# Patient Record
Sex: Female | Born: 1980 | Race: Black or African American | Hispanic: No | Marital: Married | Smoking: Never smoker
Health system: Southern US, Community
[De-identification: ages and names within clinical notes are randomized; demographics above are authoritative.]

## PROBLEM LIST (undated history)

## (undated) ENCOUNTER — Inpatient Hospital Stay (HOSPITAL_COMMUNITY): Payer: Self-pay

## (undated) DIAGNOSIS — L509 Urticaria, unspecified: Secondary | ICD-10-CM

## (undated) DIAGNOSIS — D649 Anemia, unspecified: Secondary | ICD-10-CM

## (undated) DIAGNOSIS — IMO0002 Reserved for concepts with insufficient information to code with codable children: Secondary | ICD-10-CM

## (undated) DIAGNOSIS — D869 Sarcoidosis, unspecified: Secondary | ICD-10-CM

## (undated) DIAGNOSIS — M329 Systemic lupus erythematosus, unspecified: Secondary | ICD-10-CM

## (undated) HISTORY — DX: Reserved for concepts with insufficient information to code with codable children: IMO0002

## (undated) HISTORY — DX: Systemic lupus erythematosus, unspecified: M32.9

## (undated) HISTORY — DX: Urticaria, unspecified: L50.9

## (undated) HISTORY — DX: Anemia, unspecified: D64.9

## (undated) HISTORY — PX: DILATION AND CURETTAGE OF UTERUS: SHX78

## (undated) HISTORY — PX: BIOPSY THYROID: PRO38

---

## 2009-08-14 ENCOUNTER — Inpatient Hospital Stay (HOSPITAL_COMMUNITY): Admission: AD | Admit: 2009-08-14 | Discharge: 2009-08-14 | Payer: Self-pay | Admitting: Obstetrics & Gynecology

## 2009-08-14 ENCOUNTER — Ambulatory Visit: Payer: Self-pay | Admitting: Advanced Practice Midwife

## 2009-08-16 ENCOUNTER — Inpatient Hospital Stay (HOSPITAL_COMMUNITY): Admission: AD | Admit: 2009-08-16 | Discharge: 2009-08-16 | Payer: Self-pay | Admitting: Obstetrics & Gynecology

## 2009-08-23 ENCOUNTER — Inpatient Hospital Stay (HOSPITAL_COMMUNITY): Admission: AD | Admit: 2009-08-23 | Discharge: 2009-08-23 | Payer: Self-pay | Admitting: Obstetrics and Gynecology

## 2009-08-26 ENCOUNTER — Ambulatory Visit: Payer: Self-pay | Admitting: Obstetrics & Gynecology

## 2009-08-26 ENCOUNTER — Ambulatory Visit (HOSPITAL_COMMUNITY): Admission: RE | Admit: 2009-08-26 | Discharge: 2009-08-26 | Payer: Self-pay | Admitting: Obstetrics & Gynecology

## 2009-08-28 ENCOUNTER — Ambulatory Visit (HOSPITAL_COMMUNITY): Admission: AD | Admit: 2009-08-28 | Discharge: 2009-08-28 | Payer: Self-pay | Admitting: Obstetrics & Gynecology

## 2009-09-10 ENCOUNTER — Ambulatory Visit: Payer: Self-pay | Admitting: Obstetrics & Gynecology

## 2009-09-10 LAB — CONVERTED CEMR LAB
HCT: 30.2 % — ABNORMAL LOW (ref 36.0–46.0)
MCV: 86.6 fL (ref 78.0–100.0)
Platelets: 170 10*3/uL (ref 150–400)
RDW: 14.2 % (ref 11.5–15.5)

## 2010-08-16 IMAGING — US US OB TRANSVAGINAL
1 series · 14 of 28 positions shown · non-contrast
Comparison: none

OBSTETRICAL ULTRASOUND:
 This ultrasound exam was performed in the [HOSPITAL] Ultrasound Department.  The OB US report was generated in the AS system, and faxed to the ordering physician.  This report is also available in [REDACTED] PACS.

[Series 1: us ob comp less 14 wks · 0.14mm/px · 14 of 29 slices shown]
[im 2/29]
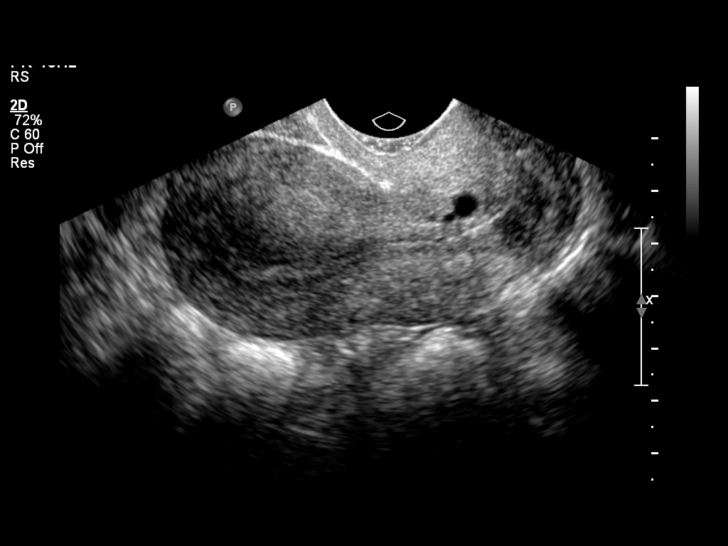
[im 4/29]
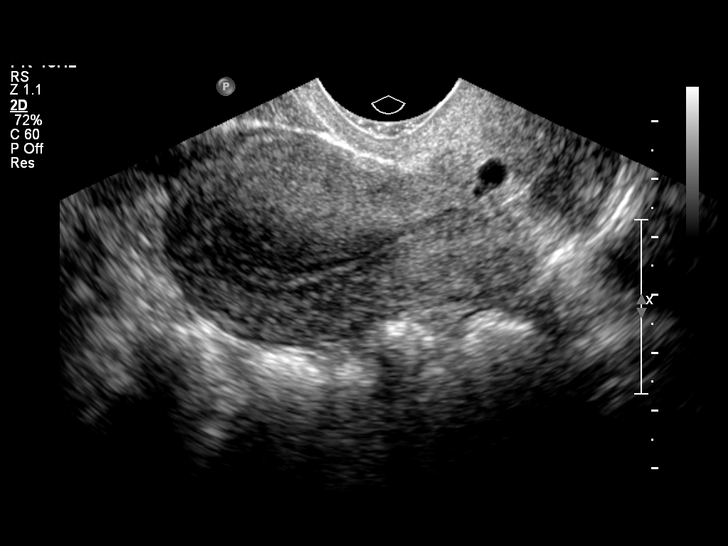
[im 6/29]
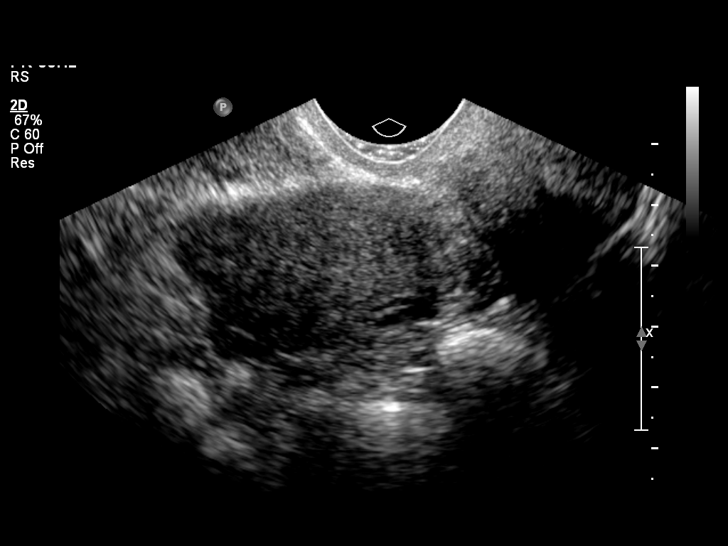
[im 8/29]
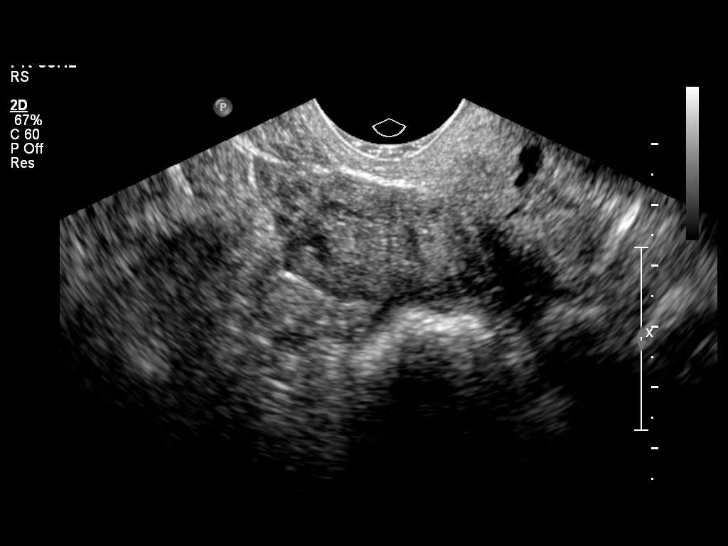
[im 10/29]
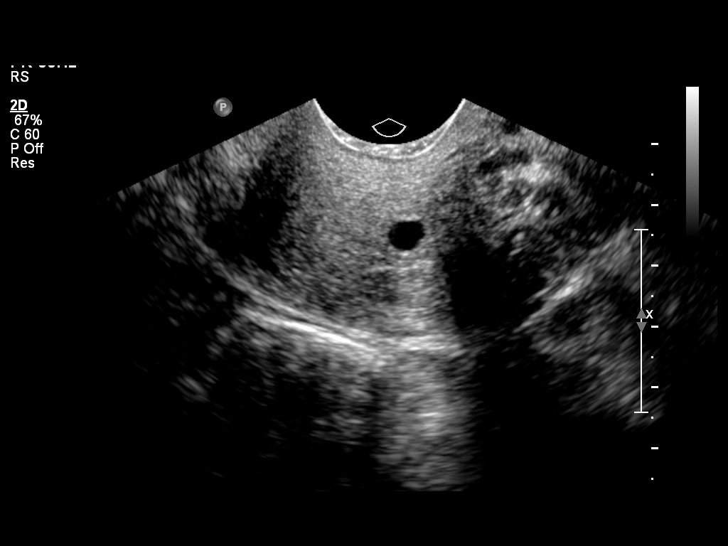
[im 12/29]
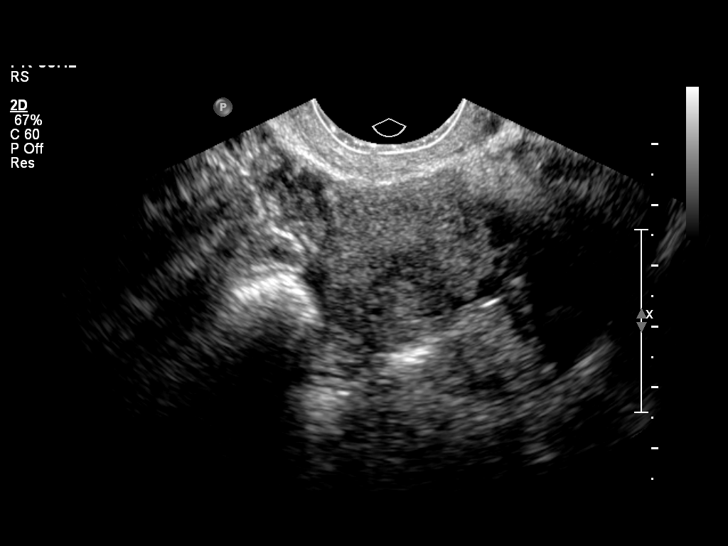
[im 14/29]
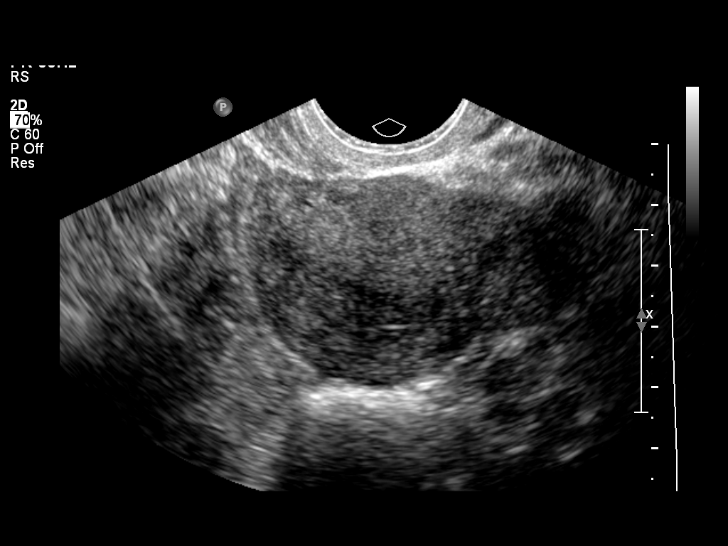
[im 16/29]
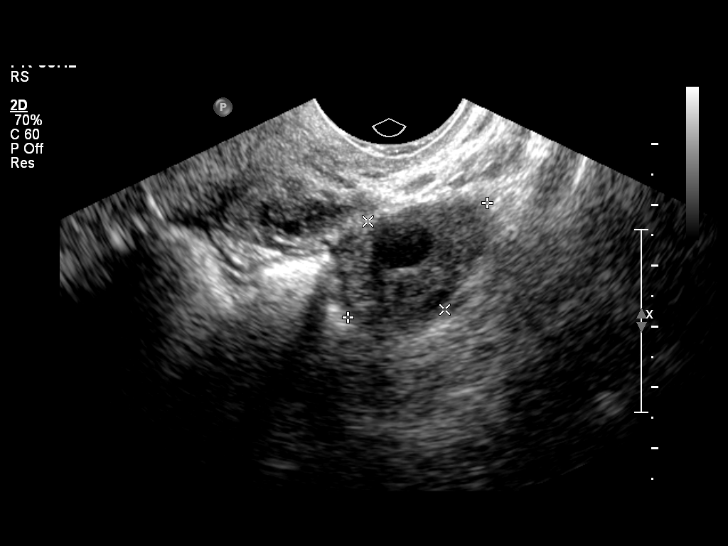
[im 18/29]
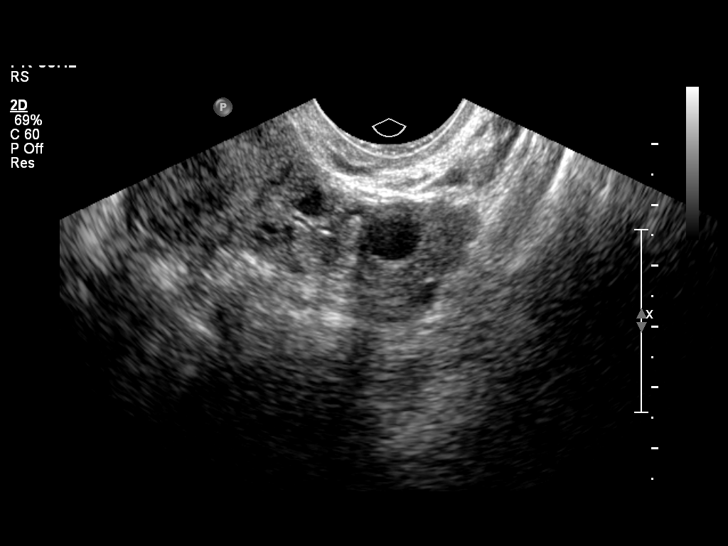
[im 20/29]
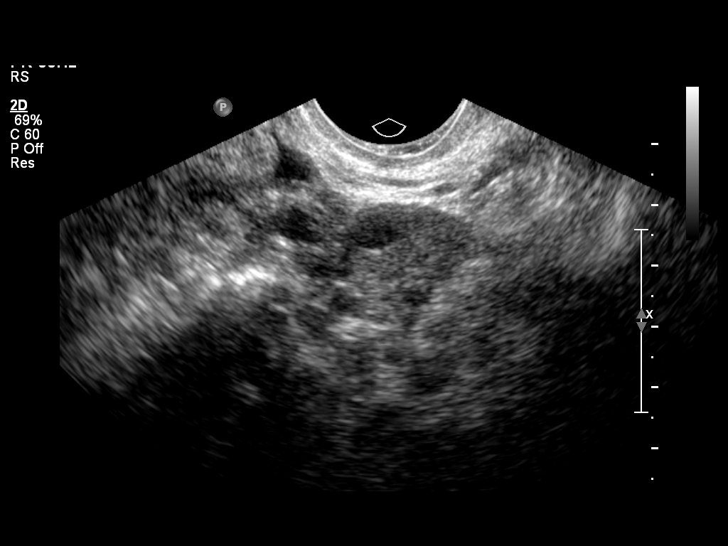
[im 22/29]
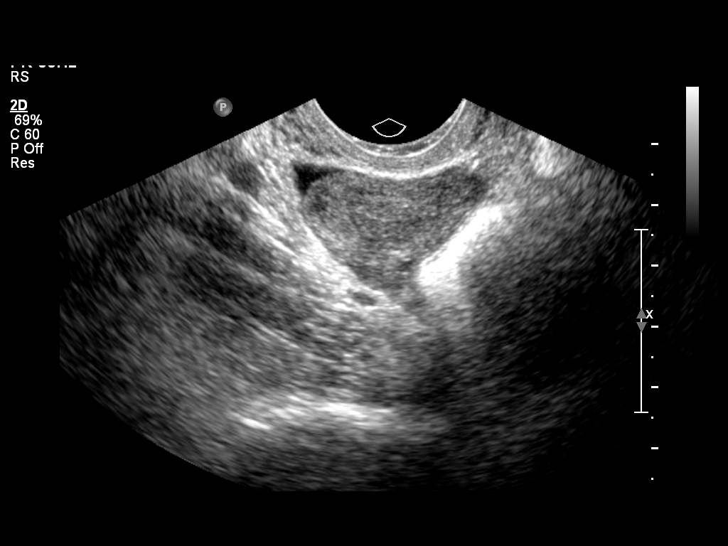
[im 24/29]
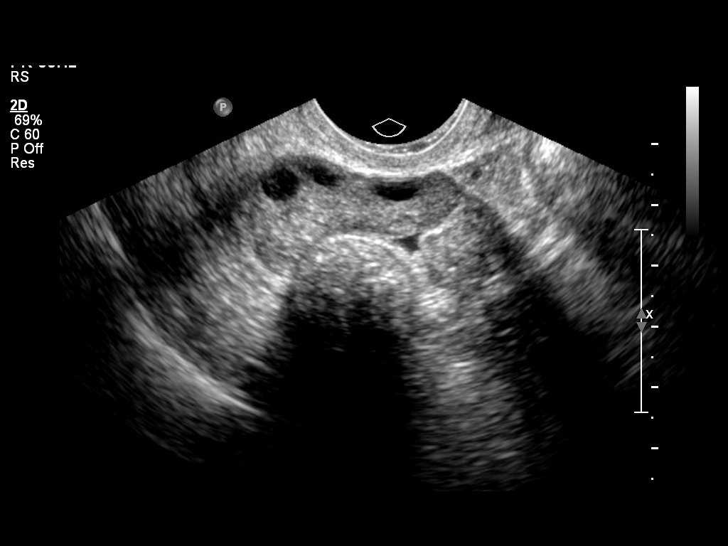
[im 26/29]
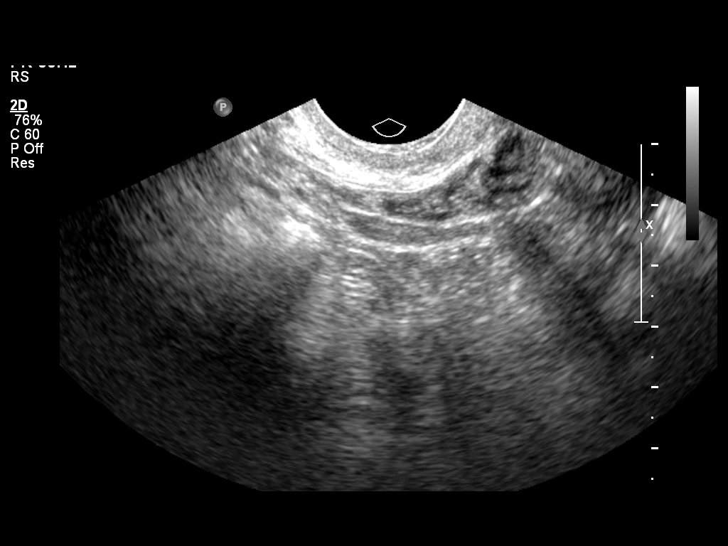
[im 29/29]
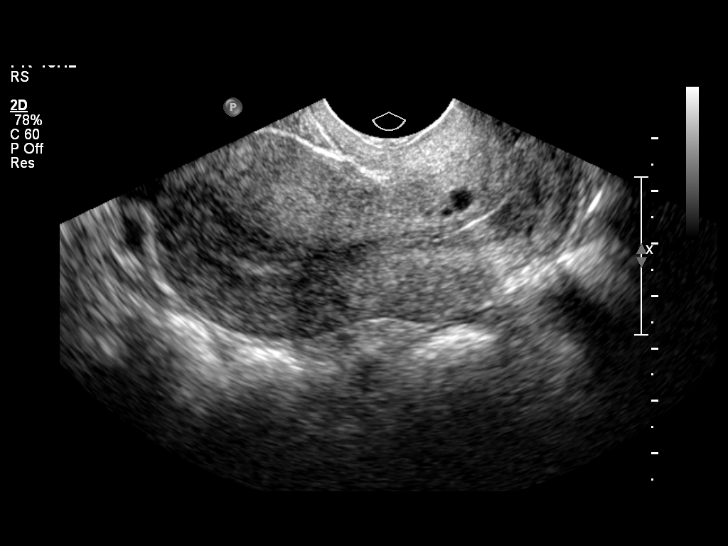

[14 of 28 positions shown; findings below may reference images not displayed]

IMPRESSION: See AS Obstetric US report.

## 2011-03-10 LAB — CBC
HCT: 33.4 % — ABNORMAL LOW (ref 36.0–46.0)
HCT: 33.5 % — ABNORMAL LOW (ref 36.0–46.0)
Hemoglobin: 11.1 g/dL — ABNORMAL LOW (ref 12.0–15.0)
Hemoglobin: 11.2 g/dL — ABNORMAL LOW (ref 12.0–15.0)
MCV: 86.4 fL (ref 78.0–100.0)
Platelets: 178 10*3/uL (ref 150–400)
RBC: 3.87 MIL/uL (ref 3.87–5.11)
WBC: 2.5 10*3/uL — ABNORMAL LOW (ref 4.0–10.5)
WBC: 2.7 10*3/uL — ABNORMAL LOW (ref 4.0–10.5)

## 2011-03-10 LAB — BUN: BUN: 7 mg/dL (ref 6–23)

## 2011-03-10 LAB — CREATININE, SERUM
Creatinine, Ser: 0.57 mg/dL (ref 0.4–1.2)
GFR calc non Af Amer: >60 mL/min (ref >60)

## 2011-03-10 LAB — DIFFERENTIAL
Blasts: 0 %
Eosinophils Absolute: 0 10*3/uL (ref 0.0–0.7)
Eosinophils Relative: 0 % (ref 0–5)
Myelocytes: 0 %
Neutro Abs: 0.9 10*3/uL — ABNORMAL LOW (ref 1.7–7.7)
Neutrophils Relative %: 35 % — ABNORMAL LOW (ref 43–77)
Promyelocytes Absolute: 0 %
nRBC: 0 /100 WBC

## 2011-03-10 LAB — WET PREP, GENITAL
Trich, Wet Prep: NONE SEEN
Yeast Wet Prep HPF POC: NONE SEEN

## 2011-03-10 LAB — GC/CHLAMYDIA PROBE AMP, GENITAL
Chlamydia, DNA Probe: NEGATIVE
GC Probe Amp, Genital: NEGATIVE

## 2011-03-10 LAB — POCT PREGNANCY, URINE: Preg Test, Ur: POSITIVE

## 2011-03-10 LAB — ABO/RH: ABO/RH(D): A POS

## 2011-03-10 LAB — HCG, QUANTITATIVE, PREGNANCY: hCG, Beta Chain, Quant, S: 41 m[IU]/mL — ABNORMAL HIGH (ref <5)

## 2016-05-23 DIAGNOSIS — M329 Systemic lupus erythematosus, unspecified: Secondary | ICD-10-CM | POA: Insufficient documentation

## 2016-05-23 DIAGNOSIS — Z79899 Other long term (current) drug therapy: Secondary | ICD-10-CM | POA: Insufficient documentation

## 2018-01-04 HISTORY — PX: DILATION AND CURETTAGE, DIAGNOSTIC / THERAPEUTIC: SUR384

## 2018-01-21 DIAGNOSIS — O039 Complete or unspecified spontaneous abortion without complication: Secondary | ICD-10-CM | POA: Insufficient documentation

## 2018-02-04 DIAGNOSIS — D649 Anemia, unspecified: Secondary | ICD-10-CM | POA: Insufficient documentation

## 2018-02-04 HISTORY — DX: Anemia, unspecified: D64.9

## 2018-02-28 ENCOUNTER — Ambulatory Visit: Payer: BLUE CROSS/BLUE SHIELD | Admitting: Allergy and Immunology

## 2018-02-28 ENCOUNTER — Encounter: Payer: Self-pay | Admitting: Allergy and Immunology

## 2018-02-28 VITALS — BP 102/68 | HR 82 | Temp 98.5°F | Resp 16 | Ht 67.3 in | Wt 198.2 lb

## 2018-02-28 DIAGNOSIS — L500 Allergic urticaria: Secondary | ICD-10-CM

## 2018-02-28 DIAGNOSIS — J3089 Other allergic rhinitis: Secondary | ICD-10-CM | POA: Insufficient documentation

## 2018-02-28 DIAGNOSIS — H1013 Acute atopic conjunctivitis, bilateral: Secondary | ICD-10-CM | POA: Diagnosis not present

## 2018-02-28 DIAGNOSIS — H1010 Acute atopic conjunctivitis, unspecified eye: Secondary | ICD-10-CM | POA: Insufficient documentation

## 2018-02-28 HISTORY — DX: Allergic urticaria: L50.0

## 2018-02-28 HISTORY — DX: Acute atopic conjunctivitis, unspecified eye: H10.10

## 2018-02-28 HISTORY — DX: Other allergic rhinitis: J30.89

## 2018-02-28 MED ORDER — LEVOCETIRIZINE DIHYDROCHLORIDE 5 MG PO TABS: 5.0000 mg | ORAL_TABLET | Freq: Every evening | ORAL | 5 refills | Status: DC

## 2018-02-28 MED ORDER — OLOPATADINE HCL 0.7 % OP SOLN: 1.0000 [drp] | Freq: Every day | OPHTHALMIC | 5 refills | Status: DC | PRN

## 2018-02-28 MED ORDER — MONTELUKAST SODIUM 10 MG PO TABS: 10.0000 mg | ORAL_TABLET | Freq: Every day | ORAL | 5 refills | Status: DC

## 2018-02-28 MED ORDER — RANITIDINE HCL 150 MG PO TABS: ORAL_TABLET | ORAL | 5 refills | Status: DC

## 2018-02-28 MED ORDER — FLUTICASONE PROPIONATE 93 MCG/ACT NA EXHU: 2.0000 | INHALANT_SUSPENSION | Freq: Two times a day (BID) | NASAL | 5 refills | Status: DC

## 2018-02-28 NOTE — Assessment & Plan Note (Signed)
   Treatment plan as outlined above for allergic rhinitis.  A prescription has been provided for Pazeo, one drop per eye daily as needed.  I have also recommended eye lubricant drops (i.e., Natural Tears) as needed. 

## 2018-02-28 NOTE — Assessment & Plan Note (Addendum)
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 Skin tests to select food allergens were negative today.  The negative predictive value for food allergen skin testing is excellent (approximately 95%) NSAIDs and emotional stress commonly exacerbate urticaria but are not the underlying etiology in this case. Physical urticarias are negative by history (i.e. pressure-induced, temperature, vibration, solar, etc.). History and lesions are not consistent with urticaria pigmentosa so I am not suspicious for mastocytosis. There are no concomitant symptoms concerning for anaphylaxis or constitutional symptoms worrisome for an underlying malignancy.   We will not order labs at this time, however, if lesions recur, persist, progress, or change in character in the absence of pollen exposure, we will assess potential etiologies with screening labs.  For symptom relief, patient is to take oral antihistamines as directed.  A prescription has been provided for levocetirizine 5 mg daily as needed.  A prescription has been provided for ranitidine 150 mg twice daily as needed.  A prescription has been provided for montelukast 10 mg daily at bedtime.  Should symptoms recur in the absence of pollen exposure, a journal is to be kept recording any foods eaten, beverages consumed, medications taken within a 6 hour period prior to the onset of symptoms, as well as record activities being performed, and environmental conditions. For any symptoms concerning for anaphylaxis, 911 is to be called immediately.

## 2018-02-28 NOTE — Assessment & Plan Note (Signed)
   Aeroallergen avoidance measures have been discussed and provided in written form.  Levocetirizine and montelukast have been prescribed (as above).  A prescription has been provided for Xhance, 2 actuations per nostril twice a day. Proper technique has been discussed and demonstrated.  Nasal saline spray (i.e., Simply Saline) or nasal saline lavage (i.e., NeilMed) is recommended as needed and prior to medicated nasal sprays.  If allergen avoidance measures and medications fail to adequately relieve symptoms, aeroallergen immunotherapy will be considered. 

## 2018-02-28 NOTE — Patient Instructions (Addendum)
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 Skin tests to select food allergens were negative today.  The negative predictive value for food allergen skin testing is excellent (approximately 95%) NSAIDs and emotional stress commonly exacerbate urticaria but are not the underlying etiology in this case. Physical urticarias are negative by history (i.e. pressure-induced, temperature, vibration, solar, etc.). History and lesions are not consistent with urticaria pigmentosa so I am not suspicious for mastocytosis. There are no concomitant symptoms concerning for anaphylaxis or constitutional symptoms worrisome for an underlying malignancy.   We will not order labs at this time, however, if lesions recur, persist, progress, or change in character in the absence of pollen exposure, we will assess potential etiologies with screening labs.  For symptom relief, patient is to take oral antihistamines as directed.  A prescription has been provided for levocetirizine 5 mg daily as needed.  A prescription has been provided for ranitidine 150 mg twice daily as needed.  A prescription has been provided for montelukast 10 mg daily at bedtime.  Should symptoms recur in the absence of pollen exposure, a journal is to be kept recording any foods eaten, beverages consumed, medications taken within a 6 hour period prior to the onset of symptoms, as well as record activities being performed, and environmental conditions. For any symptoms concerning for anaphylaxis, 911 is to be called immediately.  Seasonal and perennial allergic rhinitis  Aeroallergen avoidance measures have been discussed and provided in written form.  Levocetirizine and montelukast have been prescribed (as above).  A prescription has been provided for [facility], 2 actuations per nostril twice a day. Proper technique has been discussed and demonstrated.  Nasal saline  spray (i.e., Simply Saline) or nasal saline lavage (i.e., NeilMed) is recommended as needed and prior to medicated nasal sprays.  If allergen avoidance measures and medications fail to adequately relieve symptoms, aeroallergen immunotherapy will be considered.  Allergic conjunctivitis  Treatment plan as outlined above for allergic rhinitis.  A prescription has been provided for Pazeo, one drop per eye daily as needed.  I have also recommended eye lubricant drops (i.e., Natural Tears) as needed.   Return in about 3 months (around 05/31/2018), or if symptoms worsen or fail to improve.  Urticaria (Hives)  . Levocetirizine (Xyzal) 5 mg twice a day and ranitidine (Zantac) 150 mg twice a day. If no symptoms for 7-14 days then decrease to. . Levocetirizine (Xyzal) 5 mg twice a day and ranitidine (Zantac) 150 mg once a day.  If no symptoms for 7-14 days then decrease to. . Levocetirizine (Xyzal) 5 mg twice a day.  If no symptoms for 7-14 days then decrease to. . Levocetirizine (Xyzal) 5 mg once a day.  May use Benadryl (diphenhydramine) as needed for breakthrough symptoms       If symptoms return, then step up dosage  Reducing Pollen Exposure  The American Academy of Allergy, Asthma and Immunology suggests the following steps to reduce your exposure to pollen during allergy seasons.    1. Do not hang sheets or clothing out to dry; pollen may collect on these items. 2. Do not mow lawns or spend time around freshly cut grass; mowing stirs up pollen. 3. Keep windows closed at night.  Keep car windows closed while driving. 4. Minimize morning activities outdoors, a time when pollen counts are usually at their highest. 5. Stay indoors as much as possible when pollen counts or humidity is high and on windy days  when pollen tends to remain in the air longer. 6. Use air conditioning when possible.  Many air conditioners have filters that trap the pollen spores. 7. Use a HEPA room air filter to  remove pollen form the indoor air you breathe.   Control of House Dust Mite Allergen  House dust mites play a major role in allergic asthma and rhinitis.  They occur in environments with high humidity wherever human skin, the food for dust mites is found. High levels have been detected in dust obtained from mattresses, pillows, carpets, upholstered furniture, bed covers, clothes and soft toys.  The principal allergen of the house dust mite is found in its feces.  A gram of dust may contain 1,000 mites and 250,000 fecal particles.  Mite antigen is easily measured in the air during house cleaning activities.    1. Encase mattresses, including the box spring, and pillow, in an air tight cover.  Seal the zipper end of the encased mattresses with wide adhesive tape. 2. Wash the bedding in water of 130 degrees Farenheit weekly.  Avoid cotton comforters/quilts and flannel bedding: the most ideal bed covering is the dacron comforter. 3. Remove all upholstered furniture from the bedroom. 4. Remove carpets, carpet padding, rugs, and non-washable window drapes from the bedroom.  Wash drapes weekly or use plastic window coverings. 5. Remove all non-washable stuffed toys from the bedroom.  Wash stuffed toys weekly. 6. Have the room cleaned frequently with a vacuum cleaner and a damp dust-mop.  The patient should not be in a room which is being cleaned and should wait 1 hour after cleaning before going into the room. 7. Close and seal all heating outlets in the bedroom.  Otherwise, the room will become filled with dust-laden air.  An electric heater can be used to heat the room. Reduce indoor humidity to less than 50%.  Do not use a humidifier.  Control of Mold Allergen  Mold and fungi can grow on a variety of surfaces provided certain temperature and moisture conditions exist.  Outdoor molds grow on plants, decaying vegetation and soil.  The major outdoor mold, Alternaria and Cladosporium, are found in very  high numbers during hot and dry conditions.  Generally, a late Summer - Fall peak is seen for common outdoor fungal spores.  Rain will temporarily lower outdoor mold spore count, but counts rise rapidly when the rainy period ends.  The most important indoor molds are Aspergillus and Penicillium.  Dark, humid and poorly ventilated basements are ideal sites for mold growth.  The next most common sites of mold growth are the bathroom and the kitchen.  Outdoor MicrosoftMold Control 1. Use air conditioning and keep windows closed 2. Avoid exposure to decaying vegetation. 3. Avoid leaf raking. 4. Avoid grain handling. 5. Consider wearing a face mask if working in moldy areas.  Indoor Mold Control 6. Maintain humidity below 50%. 7. Clean washable surfaces with 5% bleach solution. 8. Remove sources e.g. Contaminated carpets.

## 2018-02-28 NOTE — Progress Notes (Signed)
New Patient Note  RE: Brittany [name] MRN: [id number] DOB: 01/28/1981 Date of Office Visit: 02/28/2018  Referring provider: No ref. provider found Primary care provider: Francee Gentile, MD  Chief Complaint: Urticaria and Allergic Reaction   History of present illness: Brittany [name] is a 37 y.o. female presenting today for evaluation of urticaria.  Over the past 12 days, Brittany [name] has experienced recurrent episodes of hives. The hives have appeared at different times over her back, arms, legs and buttocks.  The lesions are described as erythematous, raised, and pruritic.  Individual hives last less than 24 hours without leaving residual pigmentation or bruising. She denies concomitant angioedema, cardiopulmonary symptoms and GI symptoms. She has not experienced unexpected weight loss, recurrent fevers or drenching night sweats.  The hives first developed in the middle of the night.  She had the window open and thought that may be the hives were a result of pollen exposure.  Otherwise, no specific medication, food, skin care product, detergent, soap, or other environmental triggers have been identified. The symptoms do not seem to correlate with NSAIDs use or emotional stress. She did not have symptoms consistent with a respiratory tract infection at the time of symptom onset. Brittany [name] has tried to control symptoms with prednisone and OTC antihistamines which have offered adequate temporary relief. She has not been evaluated and treated in the emergency department for these symptoms. Skin biopsy has not been performed. Recently, she has been experiencing hives daily. Brittany [name] experiences nasal congestion, rhinorrhea, sneezing, postnasal drainage, nasal pruritus, and ocular pruritus.  These symptoms are most severe with pollen exposure.  She states that at times when exposed to pollen her "whole face will blow up."  She takes diphenhydramine in an attempt to control the symptoms.  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 Skin tests to select food allergens were negative today.  The negative predictive value for food allergen skin testing is excellent (approximately 95%) NSAIDs and emotional stress commonly exacerbate urticaria but are not the underlying etiology in this case. Physical urticarias are negative by history (i.e. pressure-induced, temperature, vibration, solar, etc.). History and lesions are not consistent with urticaria pigmentosa so I am not suspicious for mastocytosis. There are no concomitant symptoms concerning for anaphylaxis or constitutional symptoms worrisome for an underlying malignancy.   We will not order labs at this time, however, if lesions recur, persist, progress, or change in character in the absence of pollen exposure, we will assess potential etiologies with screening labs.  For symptom relief, patient is to take oral antihistamines as directed.  A prescription has been provided for levocetirizine 5 mg daily as needed.  A prescription has been provided for ranitidine 150 mg twice daily as needed.  A prescription has been provided for montelukast 10 mg daily at bedtime.  Should symptoms recur in the absence of pollen exposure, a journal is to be kept recording any foods eaten, beverages consumed, medications taken within a 6 hour period prior to the onset of symptoms, as well as record activities being performed, and environmental conditions. For any symptoms concerning for anaphylaxis, 911 is to be called immediately.  Seasonal and perennial allergic rhinitis  Aeroallergen avoidance measures have been discussed and provided in written form.  Levocetirizine and montelukast have been prescribed (as above).  A prescription has been provided for [facility], 2 actuations per nostril twice a day. Proper technique has been discussed and demonstrated.  Nasal  saline spray (i.e., Simply Saline) or nasal saline lavage (i.e., NeilMed) is recommended as needed and prior to medicated nasal sprays.  If allergen avoidance measures and medications fail to adequately relieve symptoms, aeroallergen immunotherapy will be considered.  Allergic conjunctivitis  Treatment plan as outlined above for allergic rhinitis.  A prescription has been provided for Pazeo, one drop per eye daily as needed.  I have also recommended eye lubricant drops (i.e., Natural Tears) as needed.   Meds ordered this encounter  Medications  . levocetirizine (XYZAL) 5 MG tablet    Sig: Take 1 tablet (5 mg total) by mouth every evening. May use one tablet twice a day as needed    Dispense:  30 tablet    Refill:  5  . ranitidine (ZANTAC) 150 MG tablet    Sig: One tablet one to two times a day as needed    Dispense:  60 tablet    Refill:  5  . montelukast (SINGULAIR) 10 MG tablet    Sig: Take 1 tablet (10 mg total) by mouth at bedtime.    Dispense:  30 tablet    Refill:  5  . Fluticasone Propionate (XHANCE) 93 MCG/ACT EXHU    Sig: Place 2 sprays into the nose 2 (two) times daily.    Dispense:  32 mL    Refill:  5  . Olopatadine HCl (PAZEO) 0.7 % SOLN    Sig: Place 1 drop into both eyes daily as needed (for itchy eyes).    Dispense:  1 Bottle    Refill:  5    Diagnostics: Environmental skin testing: Robust positive to grass pollen, weed pollen, tree pollen, molds, and dust mite antigen. Food allergen skin testing: Negative despite a positive histamine control.    Physical examination: Blood pressure 102/68, pulse 82, temperature 98.5 F (36.9 C), temperature source Oral, resp. rate 16, height 5' 7.3" (1.709 m), weight 198 lb 3.2 oz (89.9 kg), SpO2 98 %.  General: Alert, interactive, in no acute distress. HEENT: TMs pearly gray, turbinates edematous with clear discharge, post-pharynx mildly erythematous. Neck: Supple without lymphadenopathy. Lungs: Clear to auscultation  without wheezing, rhonchi or rales. CV: Normal S1, S2 without murmurs. Abdomen: Nondistended, nontender. Skin: Warm and dry, without lesions or rashes. Extremities:  No clubbing, cyanosis or edema. Neuro:   Grossly intact.  Review of systems:  Review of systems negative except as noted in HPI / PMHx or noted below: Review of Systems  Constitutional: Negative.   HENT: Negative.   Eyes: Negative.   Respiratory: Negative.   Cardiovascular: Negative.   Gastrointestinal: Negative.   Genitourinary: Negative.   Musculoskeletal: Negative.   Skin: Negative.   Neurological: Negative.   Endo/Heme/Allergies: Negative.   Psychiatric/Behavioral: Negative.     Past medical history:  Past Medical History:  Diagnosis Date  . Anemia   . Lupus     Past surgical history:  Past Surgical History:  Procedure Laterality Date  . BIOPSY THYROID    . DILATION AND CURETTAGE, DIAGNOSTIC / THERAPEUTIC  01/04/2018    Family history: Family History  Problem Relation Age of Onset  . Hypertension Mother   . Anemia Mother   . Food Allergy Mother        red dye, tomato, strawberry  . Allergic rhinitis Father   . Prostate cancer Father   . Heart disease Father   . Allergy (severe) Father        insect bee stings  . Allergic rhinitis Brother   .  Hypertension Brother   . Heart disease Paternal Grandfather     Social history: Social History   Socioeconomic History  . Marital status: Single    Spouse name: Not on file  . Number of children: Not on file  . Years of education: Not on file  . Highest education level: Not on file  Occupational History  . Not on file  Social Needs  . Financial resource strain: Not on file  . Food insecurity:    Worry: Not on file    Inability: Not on file  . Transportation needs:    Medical: Not on file    Non-medical: Not on file  Tobacco Use  . Smoking status: Never Smoker  . Smokeless tobacco: Never Used  Substance and Sexual Activity  . Alcohol  use: Yes    Alcohol/week: 0.6 oz    Types: 1 Glasses of wine per week    Comment: social drinker  . Drug use: Never  . Sexual activity: Yes  Lifestyle  . Physical activity:    Days per week: Not on file    Minutes per session: Not on file  . Stress: Not on file  Relationships  . Social connections:    Talks on phone: Not on file    Gets together: Not on file    Attends religious service: Not on file    Active member of club or organization: Not on file    Attends meetings of clubs or organizations: Not on file    Relationship status: Not on file  . Intimate partner violence:    Fear of current or ex partner: Not on file    Emotionally abused: Not on file    Physically abused: Not on file    Forced sexual activity: Not on file  Other Topics Concern  . Not on file  Social History Narrative  . Not on file   Environmental History: Patient lives in a 38 year old house with carpeting throughout and central air/heat.  There are 2 dogs in the home which have access to her bedroom.  There is no known mold/water damage in the home.  She is a non-smoker.  Allergies as of 02/28/2018      Reactions   Latex Hives      Medication List        Accurate as of 02/28/18  1:17 PM. Always use your most recent med list.          diphenhydrAMINE 25 mg capsule Commonly known as:  BENADRYL Take 50 mg by mouth every 6 (six) hours as needed for itching.   Fluticasone Propionate 93 MCG/ACT Exhu Commonly known as:  XHANCE Place 2 sprays into the nose 2 (two) times daily.   hydroxychloroquine 200 MG tablet Commonly known as:  PLAQUENIL Take by mouth.   levocetirizine 5 MG tablet Commonly known as:  XYZAL Take 1 tablet (5 mg total) by mouth every evening. May use one tablet twice a day as needed   methylergonovine 0.2 MG tablet Commonly known as:  METHERGINE Take by mouth.   montelukast 10 MG tablet Commonly known as:  SINGULAIR Take 1 tablet (10 mg total) by mouth at bedtime.     Olopatadine HCl 0.7 % Soln Commonly known as:  PAZEO Place 1 drop into both eyes daily as needed (for itchy eyes).   oxyCODONE-acetaminophen 5-325 MG tablet Commonly known as:  PERCOCET/ROXICET Take 1 tablet every 4 hours or 2 tablets every 6 hours as needed for moderate or severe  pain.   predniSONE 5 MG tablet Commonly known as:  DELTASONE 5 mg daily as needed.   ranitidine 150 MG tablet Commonly known as:  ZANTAC One tablet one to two times a day as needed       Known medication allergies: Allergies  Allergen Reactions  . Latex Hives    I appreciate the opportunity to take part in Shaunee's care. Please do not hesitate to contact me with questions.  Sincerely,   R. Jorene Guest, MD

## 2018-06-05 ENCOUNTER — Encounter: Payer: Self-pay | Admitting: Allergy and Immunology

## 2018-06-05 ENCOUNTER — Ambulatory Visit: Payer: BLUE CROSS/BLUE SHIELD | Admitting: Allergy and Immunology

## 2018-06-05 DIAGNOSIS — L500 Allergic urticaria: Secondary | ICD-10-CM | POA: Diagnosis not present

## 2018-06-05 DIAGNOSIS — J3089 Other allergic rhinitis: Secondary | ICD-10-CM | POA: Diagnosis not present

## 2018-06-05 DIAGNOSIS — H1013 Acute atopic conjunctivitis, bilateral: Secondary | ICD-10-CM | POA: Diagnosis not present

## 2018-06-05 NOTE — Progress Notes (Signed)
Follow-up Note  RE: Brittany [name] MRN: [id number] DOB: 1981-09-19 Date of Office Visit: 06/05/2018  Primary care provider: Francee Gentile, MD Referring provider: Francee Gentile, MD  History of present illness: Brittany [name] is a 37 y.o. female with urticaria and allergic rhinoconjunctivitis presenting today for follow-up.  She was previously seen in this clinic for her initial evaluation on February 28, 2018.  She reports that in the interval since her previous visit her urticaria has "pretty much gone away completely."  The only 2 times that she has developed hives occurred when she was outdoors in the grass.  Her nasal and ocular allergy symptoms have been well controlled with the medications which were prescribed.  She occasionally requires to levocetirizine tablets when going outdoors.  The patient is interested in the possibility of starting aeroallergen immunotherapy to reduce symptoms and decrease medication requirement.  Assessment and plan: Allergic urticaria Improved with medications and avoidance of grass pollen.  Continue allergen avoidance and H1/H2 receptor blockade, titrating to the lowest effective dose necessary to suppress urticaria.  Seasonal and perennial allergic rhinitis  Continue allergen avoidance, levocetirizine as needed, and Xhance as needed.  The risks and benefits of aeroallergen immunotherapy have been discussed. The patient is motivated to initiate immunotherapy if insurance coverage is favorable. She will let us know how she would like to proceed.  Allergic conjunctivitis  Continue allergen avoidance measures and olopatadine eyedrops as needed.   Physical examination: Blood pressure 106/70, pulse 63, temperature 98 F (36.7 C), temperature source Oral, resp. rate 16, SpO2 99 %.  General: Alert, interactive, in no acute distress. HEENT: TMs pearly gray, turbinates mildly edematous without discharge, post-pharynx mildly  erythematous. Neck: Supple without lymphadenopathy. Lungs: Clear to auscultation without wheezing, rhonchi or rales. CV: Normal S1, S2 without murmurs. Skin: Warm and dry, without lesions or rashes.  The following portions of the patient's history were reviewed and updated as appropriate: allergies, current medications, past family history, past medical history, past social history, past surgical history and problem list.  Allergies as of 06/05/2018      Reactions   Latex Hives      Medication List        Accurate as of 06/05/18  4:22 PM. Always use your most recent med list.          diphenhydrAMINE 25 mg capsule Commonly known as:  BENADRYL Take 50 mg by mouth every 6 (six) hours as needed for itching.   Fluticasone Propionate 93 MCG/ACT Exhu Commonly known as:  XHANCE Place 2 sprays into the nose 2 (two) times daily.   hydroxychloroquine 200 MG tablet Commonly known as:  PLAQUENIL Take by mouth.   levocetirizine 5 MG tablet Commonly known as:  XYZAL Take 1 tablet (5 mg total) by mouth every evening. May use one tablet twice a day as needed   montelukast 10 MG tablet Commonly known as:  SINGULAIR Take 1 tablet (10 mg total) by mouth at bedtime.   Olopatadine HCl 0.7 % Soln Commonly known as:  PAZEO Place 1 drop into both eyes daily as needed (for itchy eyes).   oxyCODONE-acetaminophen 5-325 MG tablet Commonly known as:  PERCOCET/ROXICET Take 1 tablet every 4 hours or 2 tablets every 6 hours as needed for moderate or severe pain.   predniSONE 5 MG tablet Commonly known as:  DELTASONE 5 mg daily as needed.   ranitidine 150 MG tablet Commonly known as:  ZANTAC One tablet one to two times a day  as needed       Allergies  Allergen Reactions  . Latex Hives    I appreciate the opportunity to take part in Brittany [name]'s care. Please do not hesitate to contact me with questions.  Sincerely,   R. Jorene Guestarter Brittany Cotugno, MD

## 2018-06-05 NOTE — Assessment & Plan Note (Signed)
Improved with medications and avoidance of grass pollen.  Continue allergen avoidance and H1/H2 receptor blockade, titrating to the lowest effective dose necessary to suppress urticaria.

## 2018-06-05 NOTE — Assessment & Plan Note (Signed)
   Continue allergen avoidance measures and olopatadine eyedrops as needed. 

## 2018-06-05 NOTE — Assessment & Plan Note (Signed)
   Continue allergen avoidance, levocetirizine as needed, and Xhance as needed.  The risks and benefits of aeroallergen immunotherapy have been discussed. The patient is motivated to initiate immunotherapy if insurance coverage is favorable. She will let us know how she would like to proceed.

## 2018-06-05 NOTE — Patient Instructions (Addendum)
Allergic urticaria Improved with medications and avoidance of grass pollen.  Continue allergen avoidance and H1/H2 receptor blockade, titrating to the lowest effective dose necessary to suppress urticaria.  Seasonal and perennial allergic rhinitis  Continue allergen avoidance, levocetirizine as needed, and Xhance as needed.  The risks and benefits of aeroallergen immunotherapy have been discussed. The patient is motivated to initiate immunotherapy if insurance coverage is favorable. She will let us know how she would like to proceed.  Allergic conjunctivitis  Continue allergen avoidance measures and olopatadine eyedrops as needed.   Return in about 6 months (around 12/06/2018), or if symptoms worsen or fail to improve.

## 2018-09-12 ENCOUNTER — Other Ambulatory Visit: Payer: Self-pay | Admitting: Allergy and Immunology

## 2018-09-12 DIAGNOSIS — J3089 Other allergic rhinitis: Secondary | ICD-10-CM

## 2018-09-12 DIAGNOSIS — L500 Allergic urticaria: Secondary | ICD-10-CM

## 2018-10-04 ENCOUNTER — Ambulatory Visit: Payer: BLUE CROSS/BLUE SHIELD | Admitting: Family Medicine

## 2018-10-04 NOTE — Progress Notes (Deleted)
No chief complaint on file.   HPI  4 review of systems  Past Medical History:  Diagnosis Date  . Anemia   . Lupus (HCC)   . Urticaria     Current Outpatient Medications  Medication Sig Dispense Refill  . diphenhydrAMINE (BENADRYL) 25 mg capsule Take 50 mg by mouth every 6 (six) hours as needed for itching.    . Fluticasone Propionate (XHANCE) 93 MCG/ACT EXHU Place 2 sprays into the nose 2 (two) times daily. (Patient not taking: Reported on 06/05/2018) 32 mL 5  . hydroxychloroquine (PLAQUENIL) 200 MG tablet Take by mouth.    . levocetirizine (XYZAL) 5 MG tablet Take 1 tablet (5 mg total) by mouth every evening. May use one tablet twice a day as needed 30 tablet 5  . montelukast (SINGULAIR) 10 MG tablet TAKE 1 TABLET BY MOUTH AT BEDTIME 90 tablet 0  . Olopatadine HCl (PAZEO) 0.7 % SOLN Place 1 drop into both eyes daily as needed (for itchy eyes). 1 Bottle 5  . oxyCODONE-acetaminophen (PERCOCET/ROXICET) 5-325 MG tablet Take 1 tablet every 4 hours or 2 tablets every 6 hours as needed for moderate or severe pain.    . predniSONE (DELTASONE) 5 MG tablet 5 mg daily as needed.     . ranitidine (ZANTAC) 150 MG tablet One tablet one to two times a day as needed 60 tablet 5   No current facility-administered medications for this visit.     Allergies:  Allergies  Allergen Reactions  . Latex Hives    Past Surgical History:  Procedure Laterality Date  . BIOPSY THYROID    . DILATION AND CURETTAGE, DIAGNOSTIC / THERAPEUTIC  01/04/2018    Social History   Socioeconomic History  . Marital status: Single    Spouse name: Not on file  . Number of children: Not on file  . Years of education: Not on file  . Highest education level: Not on file  Occupational History  . Not on file  Social Needs  . Financial resource strain: Not on file  . Food insecurity:    Worry: Not on file    Inability: Not on file  . Transportation needs:    Medical: Not on file    Non-medical: Not on file    Tobacco Use  . Smoking status: Never Smoker  . Smokeless tobacco: Never Used  Substance and Sexual Activity  . Alcohol use: Yes    Alcohol/week: 1.0 standard drinks    Types: 1 Glasses of wine per week    Comment: social drinker  . Drug use: Never  . Sexual activity: Yes  Lifestyle  . Physical activity:    Days per week: Not on file    Minutes per session: Not on file  . Stress: Not on file  Relationships  . Social connections:    Talks on phone: Not on file    Gets together: Not on file    Attends religious service: Not on file    Active member of club or organization: Not on file    Attends meetings of clubs or organizations: Not on file    Relationship status: Not on file  Other Topics Concern  . Not on file  Social History Narrative  . Not on file    Family History  Problem Relation Age of Onset  . Hypertension Mother   . Anemia Mother   . Food Allergy Mother        red dye, tomato, strawberry  . Allergic rhinitis  Father   . Prostate cancer Father   . Heart disease Father   . Allergy (severe) Father        insect bee stings  . Allergic rhinitis Brother   . Hypertension Brother   . Heart disease Paternal Grandfather      ROS Review of Systems See HPI Constitution: No fevers or chills No malaise No diaphoresis Skin: No rash or itching Eyes: no blurry vision, no double vision GU: no dysuria or hematuria Neuro: no dizziness or headaches * all others reviewed and negative   Objective: There were no vitals filed for this visit.  Physical Exam  Assessment and Plan There are no diagnoses linked to this encounter.   [employer]

## 2018-10-18 ENCOUNTER — Encounter (HOSPITAL_COMMUNITY): Payer: Self-pay

## 2018-10-22 ENCOUNTER — Encounter (HOSPITAL_COMMUNITY): Payer: Self-pay | Admitting: *Deleted

## 2018-10-22 ENCOUNTER — Ambulatory Visit (HOSPITAL_COMMUNITY)
Admission: RE | Admit: 2018-10-22 | Discharge: 2018-10-22 | Disposition: A | Discharge status: Home / Self Care | Payer: BLUE CROSS/BLUE SHIELD | Source: Ambulatory Visit | Attending: Internal Medicine | Admitting: Internal Medicine

## 2018-10-22 DIAGNOSIS — M3219 Other organ or system involvement in systemic lupus erythematosus: Secondary | ICD-10-CM | POA: Diagnosis not present

## 2018-10-22 DIAGNOSIS — Z3A09 9 weeks gestation of pregnancy: Secondary | ICD-10-CM | POA: Insufficient documentation

## 2018-10-22 DIAGNOSIS — O09521 Supervision of elderly multigravida, first trimester: Secondary | ICD-10-CM | POA: Diagnosis not present

## 2018-10-22 DIAGNOSIS — O26891 Other specified pregnancy related conditions, first trimester: Secondary | ICD-10-CM | POA: Insufficient documentation

## 2018-10-22 DIAGNOSIS — O30041 Twin pregnancy, dichorionic/diamniotic, first trimester: Secondary | ICD-10-CM | POA: Diagnosis not present

## 2018-10-22 DIAGNOSIS — D869 Sarcoidosis, unspecified: Secondary | ICD-10-CM

## 2018-10-22 DIAGNOSIS — O30049 Twin pregnancy, dichorionic/diamniotic, unspecified trimester: Secondary | ICD-10-CM | POA: Diagnosis present

## 2018-10-22 DIAGNOSIS — M329 Systemic lupus erythematosus, unspecified: Secondary | ICD-10-CM | POA: Diagnosis not present

## 2018-10-22 HISTORY — DX: Sarcoidosis, unspecified: D86.9

## 2018-10-22 NOTE — Consult Note (Signed)
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    PREVIOUS OBSTETRICAL HISTORY:  1) 200 - NSVD @ term, female, 6 lb 15 oz, complicated by antepartum bleeding 2) 2002 - First trimester ETOP without complications 3) 2010 - First trimester spontaneous abortion treated with D&C 4) 2019 - First trimester spontaneous abortion treated with D&C     PREVIOUS GYN HISTORY:   Abnormal PAP - 2014 - colpo   GC - neg   Chlamydia - neg    Syphilis - neg   CAT - 13 x 28 x 5-6   Contraception - none    PREVIOUS MEDICAL HISTORY:  DM - neg   HTN - neg   Asthma - neg   Thyroid - neg   Rheumatic Fever - neg    Heart - neg   Lung - neg   Liver - neg   Kidney - neg   Epilepsy - neg    TB - neg   Herpes - neg   UTI - neg   2011 - SLE 2019 - ?Sarcoidosis - patient had discussion 1 month ago when she had a cold and some breathing problems    PREVIOUS SURGICAL HISTORY:  1) As above 2) 2016 - Thyroid nodule biopsy without complications    MEDICATIONS:  Prenatal Vitamins, Prednisone 5 mg QD, Plaquenil 300 mg QD, B12 500, Iron QD, Montelukast 10 mg QD, Zyrtec 10 mg QD       ALLERGIES/REACTIONS:  None      HABITS:  Smoking - neg   Drinking - neg   Drugs - neg    PSYCHOSOCIAL:  Married      PROFESSION:  Engineer    FAMILY HISTORY:  DM - F   HTN - M   Twins - GM, A   Stillborns - A    Birth Defects - neg   Mental Retardation - neg   Blood Dyscrasias - neg   Anesthesia Complications - neg    Genetic - neg          ADVANCED MATERNAL AGE:  General counseling regarding advanced maternal age was then performed.  Included in this counseling was increased risk for Trisomy 21, pre-eclampsia, IUGR, gestational diabetes and  increased perinatal morbidity and  mortality.  The roles of early 1 hr. Glucola screening with repeat @ 24-28 weeks, serial U/S every 4 weeks for fetal growth and close A-P surveillance beginning at 32 weeks were discussed.  For patients aged 38 and above, delivery @ 39 weeks is recommended.   Fretts et al: Increased maternal Age and Risk of Fetal death.  NEJM 1995; 161:096-045333:953-957  Haavaldsen C et al:  The impact of maternal age on fetal death: does length of gestation matter? Am J Obstet Gynecol. 2010 Dec: 203 (6):554.e1-8.   TRISOMY 21: Genetic counseling was then performed.  The association of Trisomy 8421 with mental retardation and various birth defects was discussed.  The risks and benefits of CVS vs. amniocentesis including a 1 % loss for CVS vs. a 1/350 risk of pregnancy loss per amnio as well as management options were explained.   Results based on maternal serum screening are gestationally age dependent for accurate risk  assessment.  80 % of fetuses with Trisomy 21 will have some marker for Trisomy 21 on U/S but 20 % will not.  Also discussed was the alternative non-invasive perinatal testing (NIPT) with cell free DNA which will identify 99.1 % of fetuses with Trisomy 21.  NIPT desired.  SLE:  General counseling was performed regarding Systemic Lupus Erythematosus and the increased risks for pregnancy wastage, IUGR and increased perinatal morbidity and mortality.  It is difficult to document any unique effect of pregnancy on the course of SLE.  Flares do not appear to be more severe than in the non-pregnant state.  Flares may occur in 15 - 60% of patients.  Mild flares usually affecting skin and joints may not confer any increased adverse prognosis on maternal-fetal outcome.  Current measures used to assess disease activity do little to help physicians predict disease flares or complications although C3, C4 and CH50 can identify renal disease exacerbations.   The role of serial U/S every 4 weeks for fetal growth and close A-P  surveillance in the third trimester was discussed.  Ideally, patients should be exacerbation free for a period of at least 6 months.  LDA was recommended as well as performing an anti-phospholipid antibody screen (APAS).  Patients with Discoid Lupus are not considered at increased risk for problems during pregnancy.  Plaquenil is not associated with increased perinatal morbidity /mortality.   TWIN GESTATION:  General counseling regarding twin gestation was then performed.  The increased complications of congenital malformations, IUGR, toxemia, perinatal morbidity and mortality, preterm labor (25-40%), preterm delivery, twin-twin transfusion if monozygotic and need for cesarean section were explained.  The roles of serial U/S every 4 weeks for fetal growth and close antepartum surveillance with weekly BPP (biophysical profile) beginning in the third trimester were explained.  Monochorionic-Diamniotic twins showed by followed by U/S every 2 weeks alternating limited scans with fetal growth and cervical evaluation beginning @ 16 weeks due to the increased risks for twin-twin transfusion and increased perinatal morbidity and mortality.  At 32 weeks, weekly A-P surveillance with BPP and UA dopplers should be initiated (if other complications occur, A-P surveillance may begin earlier.  In cases where twin-twin transfusion is suspected, the addition of MCA testing with peak systolic flow may identify significant anemia in the donor twin.  Women with uncomplicated diamniotic-dichorionic twin gestations may be delivered @ 38 weeks.  SARCOIDOSIS:  General counseling was then performed regarding Sarcoidosis and pregnancy.  Sarcoidosis is a systemic granulomatous disease od undetermined etiology.  Pregnancy outcome for most patients with Sarcoidosis is good.  Patients with pulmonary hypertension complicating severe restrictive lung disease have a higher maternal mortality which may approach 50%.  Some studies have  found an increased risk for IUGR and thus, serial U/S are recommended.     IMPRESSIONS:  1) AMA 2) Diamniotic-dichorionic twin gestation 3) SLE 4) Doubt the diagnosis of Sarcoidosis - patient was encouraged to contact the physician who talked about Sarcoid a month ago and get clarification.  No testing was done, PFT's were not ordered nor was a chest X-ray.  Patient should report back to both OB and MFM    RECOMMENDATIONS:  1) NIPT and NT/First trimester U/S in 2 weeks 2) 50 gram Glucola now with repeat @ 24-28 weeks, baseline 24 hr. urine for protein, volume, creatinine and creatinine clearance, anti-phospholipid antibody screen, Anti-SSA and Anti-SSB, C3, C4, CH50 to be done by OB 3) Serial U/S every 4 weeks for fetal growth  4) Weekly BPP  beginning @ 32 weeks 5) LDA 81 mg QD 6) F/U with Rheumatologist/Lupus Physician             60 minutes spent in face-to-face consultation with greater than 50% of the time spent in counseling.    Thank you for utilizing our ultrasound and consultative services.  If I may be of any further service, please do not hesitate to contact me.  Sincerely,   Patsi Sears, MD Maternal-Fetal Medicine   Copy of report sent to practitioner/clinic.

## 2018-10-23 ENCOUNTER — Other Ambulatory Visit (HOSPITAL_COMMUNITY): Payer: Self-pay | Admitting: *Deleted

## 2018-10-23 DIAGNOSIS — Z3682 Encounter for antenatal screening for nuchal translucency: Secondary | ICD-10-CM

## 2018-10-25 ENCOUNTER — Emergency Department (HOSPITAL_BASED_OUTPATIENT_CLINIC_OR_DEPARTMENT_OTHER): Payer: BLUE CROSS/BLUE SHIELD

## 2018-10-25 ENCOUNTER — Encounter (HOSPITAL_BASED_OUTPATIENT_CLINIC_OR_DEPARTMENT_OTHER): Payer: Self-pay | Admitting: *Deleted

## 2018-10-25 ENCOUNTER — Other Ambulatory Visit: Payer: Self-pay

## 2018-10-25 ENCOUNTER — Emergency Department (HOSPITAL_BASED_OUTPATIENT_CLINIC_OR_DEPARTMENT_OTHER)
Admission: EM | Admit: 2018-10-25 | Discharge: 2018-10-25 | Disposition: A | Discharge status: Home / Self Care | Payer: BLUE CROSS/BLUE SHIELD | Attending: Emergency Medicine | Admitting: Emergency Medicine

## 2018-10-25 DIAGNOSIS — Z3A09 9 weeks gestation of pregnancy: Secondary | ICD-10-CM | POA: Diagnosis not present

## 2018-10-25 DIAGNOSIS — O99891 Other specified diseases and conditions complicating pregnancy: Secondary | ICD-10-CM

## 2018-10-25 DIAGNOSIS — Z79899 Other long term (current) drug therapy: Secondary | ICD-10-CM | POA: Insufficient documentation

## 2018-10-25 DIAGNOSIS — O9989 Other specified diseases and conditions complicating pregnancy, childbirth and the puerperium: Secondary | ICD-10-CM | POA: Diagnosis not present

## 2018-10-25 DIAGNOSIS — O3411 Maternal care for benign tumor of corpus uteri, first trimester: Secondary | ICD-10-CM | POA: Diagnosis not present

## 2018-10-25 DIAGNOSIS — O468X1 Other antepartum hemorrhage, first trimester: Secondary | ICD-10-CM | POA: Diagnosis not present

## 2018-10-25 DIAGNOSIS — R1032 Left lower quadrant pain: Secondary | ICD-10-CM | POA: Diagnosis present

## 2018-10-25 DIAGNOSIS — R8271 Bacteriuria: Secondary | ICD-10-CM | POA: Diagnosis not present

## 2018-10-25 DIAGNOSIS — O418X10 Other specified disorders of amniotic fluid and membranes, first trimester, not applicable or unspecified: Secondary | ICD-10-CM | POA: Diagnosis not present

## 2018-10-25 LAB — URINALYSIS, MICROSCOPIC (REFLEX)

## 2018-10-25 LAB — WET PREP, GENITAL
SPERM: NONE SEEN
TRICH WET PREP: NONE SEEN
YEAST WET PREP: NONE SEEN

## 2018-10-25 LAB — COMPREHENSIVE METABOLIC PANEL
ALBUMIN: 4 g/dL (ref 3.5–5.0)
ALK PHOS: 33 U/L — AB (ref 38–126)
ALT: 11 U/L (ref 0–44)
AST: 18 U/L (ref 15–41)
Anion gap: 9 (ref 5–15)
BUN: 5 mg/dL — AB (ref 6–20)
CO2: 21 mmol/L — ABNORMAL LOW (ref 22–32)
CREATININE: 0.45 mg/dL (ref 0.44–1.00)
Calcium: 9.4 mg/dL (ref 8.9–10.3)
Chloride: 103 mmol/L (ref 98–111)
GFR calc Af Amer: >60 mL/min (ref >60)
GLUCOSE: 87 mg/dL (ref 70–99)
Potassium: 3.3 mmol/L — ABNORMAL LOW (ref 3.5–5.1)
Sodium: 133 mmol/L — ABNORMAL LOW (ref 135–145)
TOTAL PROTEIN: 8.6 g/dL — AB (ref 6.5–8.1)
Total Bilirubin: 0.5 mg/dL (ref 0.3–1.2)

## 2018-10-25 LAB — CBC WITH DIFFERENTIAL/PLATELET
Abs Immature Granulocytes: 0.01 10*3/uL (ref 0.00–0.07)
Basophils Absolute: 0 10*3/uL (ref 0.0–0.1)
Basophils Relative: 0 %
EOS ABS: 0 10*3/uL (ref 0.0–0.5)
EOS PCT: 0 %
HEMATOCRIT: 33 % — AB (ref 36.0–46.0)
HEMOGLOBIN: 10.5 g/dL — AB (ref 12.0–15.0)
IMMATURE GRANULOCYTES: 0 %
LYMPHS ABS: 0.7 10*3/uL (ref 0.7–4.0)
Lymphocytes Relative: 33 %
MCH: 28.6 pg (ref 26.0–34.0)
MCHC: 31.8 g/dL (ref 30.0–36.0)
MCV: 89.9 fL (ref 80.0–100.0)
MONOS PCT: 12 %
Monocytes Absolute: 0.3 10*3/uL (ref 0.1–1.0)
NEUTROS PCT: 55 %
Neutro Abs: 1.2 10*3/uL — ABNORMAL LOW (ref 1.7–7.7)
Platelets: 207 10*3/uL (ref 150–400)
RBC: 3.67 MIL/uL — ABNORMAL LOW (ref 3.87–5.11)
RDW: 12.9 % (ref 11.5–15.5)
WBC: 2.3 10*3/uL — ABNORMAL LOW (ref 4.0–10.5)
nRBC: 0 % (ref 0.0–0.2)

## 2018-10-25 LAB — OB RESULTS CONSOLE GC/CHLAMYDIA
Chlamydia: NEGATIVE
Gonorrhea: NEGATIVE

## 2018-10-25 LAB — URINALYSIS, ROUTINE W REFLEX MICROSCOPIC
Bilirubin Urine: NEGATIVE
GLUCOSE, UA: NEGATIVE mg/dL
KETONES UR: NEGATIVE mg/dL
Nitrite: NEGATIVE
Protein, ur: NEGATIVE mg/dL
Specific Gravity, Urine: <1.005 — ABNORMAL LOW (ref 1.005–1.030)
pH: 6.5 (ref 5.0–8.0)

## 2018-10-25 LAB — HCG, QUANTITATIVE, PREGNANCY: hCG, Beta Chain, Quant, S: 284849 m[IU]/mL — ABNORMAL HIGH (ref <5)

## 2018-10-25 MED ORDER — CEPHALEXIN 500 MG PO CAPS: 500.0000 mg | ORAL_CAPSULE | Freq: Two times a day (BID) | ORAL | 0 refills | Status: AC

## 2018-10-25 NOTE — ED Triage Notes (Signed)
Pt c/o left lower abd pain x 2 days ago , vaginal discharge x 2 days , c/o lower back pain , 9 weeks preg with twins

## 2018-10-25 NOTE — ED Notes (Signed)
Patient transported to Ultrasound 

## 2018-10-25 NOTE — ED Notes (Signed)
ED Provider at bedside. 

## 2018-10-25 NOTE — Discharge Instructions (Signed)
Thank you for allowing me to care for you today in the Emergency Department.   Take 1 tablet of Keflex 2 times daily for the next 5 days for the bacteria in your urine.  Make sure to discuss your home medications with your OB/GYN in the office.  You can take 650 mg of Tylenol every 6 hours for pain control.  Return to the emergency department if you develop severe vomiting, high fever despite taking Tylenol, uncontrollable abdominal pain, significantly worsening vaginal bleeding, or other new, concerning medications.

## 2018-10-25 NOTE — ED Provider Notes (Signed)
[facility] EMERGENCY DEPARTMENT Provider Note   CSN: [id number] Arrival date & time: 10/25/18  1331     History   Chief Complaint Chief Complaint  Patient presents with  . Abdominal Pain    HPI Brittany [name] is a [id number] 37 y.o. female history of SLE on Plaquenil and prednisone as needed who presents to the emergency department with a chief complaint of left lower quadrant pain.  The patient endorses intermittent left lower quadrant pain that she characterizes as a pressure cramping that began yesterday.  Pain is nonradiating in the last for 30 minutes before resolving spontaneously.  No known aggravating or alleviating factors.  She reports associated brown vaginal discharge that she initially had at the beginning of her pregnancy that resolved until yesterday, constipation, and bilateral low back pain.  She reports she has had nausea over the last few weeks, but it resolved prior to the onset of her current symptoms.  EDC:05/28/2019 She had an unremarkable pelvic ultrasound demonstrating dichorionic diamniotic on 10/18/18.  She had her first appointment with [facility] on 10/21/18. Based on [place] is [redacted]w[redacted]d. She is A+ blood type.  She denies fever, chest pain, dyspnea, chills, diarrhea, dysuria, hematuria, urinary frequency or hesitancy.  No recent trauma or injury.  She takes Plaquenil daily.  She also has a prednisone to take as needed.  She reports she did take some prednisone about 1 week ago as she was feeling really fatigued, which she states typically indicates that she is having a flare of SLE.  She states SLE will typically flare with fatigue and color changes in her fingers.  She reports a questionable history of sarcoidosis.  She states that she was told to urgent care several years ago that she may have sarcoidosis, but it was never confirmed.  The history is provided by the patient.  Abdominal Pain   Pertinent negatives include dysuria and headaches.    Past  Medical History:  Diagnosis Date  . Anemia   . Lupus (HCC)   . Sarcoidosis   . Urticaria     Patient Active Problem List   Diagnosis Date Noted  . Advanced maternal age in multigravida, first trimester 10/22/2018  . Twin gestation, dichorionic diamniotic 10/22/2018  . Sarcoidosis 10/22/2018  . Allergic urticaria 02/28/2018  . Seasonal and perennial allergic rhinitis 02/28/2018  . Allergic conjunctivitis 02/28/2018  . Anemia, unspecified 02/04/2018  . Complete miscarriage 01/21/2018  . High risk medication use 05/23/2016  . Systemic lupus erythematosus (HCC) 05/23/2016    Past Surgical History:  Procedure Laterality Date  . BIOPSY THYROID    . DILATION AND CURETTAGE OF UTERUS    . DILATION AND CURETTAGE, DIAGNOSTIC / THERAPEUTIC  01/04/2018     OB History    Gravida  5   Para  1   Term  1   Preterm      AB  3   Living  1     SAB  2   TAB  1   Ectopic      Multiple      Live Births               Home Medications    Prior to Admission medications   Medication Sig Start Date End Date Taking? Authorizing Provider  Prenatal Vit-Fe Fumarate-FA (PRENATAL MULTIVITAMIN) TABS tablet Take 1 tablet by mouth daily at 12 noon.   Yes [provider]  cephALEXin (KEFLEX) 500 MG capsule Take 1 capsule (500 mg  total) by mouth 2 (two) times daily for 5 days. 10/25/18 10/30/18  Emalyn Schou A, PA-C  Cetirizine HCl (ZYRTEC PO) Take by mouth.    [provider]  hydroxychloroquine (PLAQUENIL) 200 MG tablet Take by mouth. 12/18/17   [provider]  montelukast (SINGULAIR) 10 MG tablet TAKE 1 TABLET BY MOUTH AT BEDTIME 09/12/18   Bobbitt, Heywood Iles, MD  predniSONE (DELTASONE) 5 MG tablet 5 mg daily as needed.  12/18/17   [provider]    Family History Family History  Problem Relation Age of Onset  . Hypertension Mother   . Anemia Mother   . Food Allergy Mother        red dye, tomato, strawberry  . Allergic rhinitis Father    . Prostate cancer Father   . Heart disease Father   . Allergy (severe) Father        insect bee stings  . Allergic rhinitis Brother   . Hypertension Brother   . Heart disease Paternal Grandfather     Social History Social History   Tobacco Use  . Smoking status: Never Smoker  . Smokeless tobacco: Never Used  Substance Use Topics  . Alcohol use: Not Currently    Alcohol/week: 1.0 standard drinks    Types: 1 Glasses of wine per week    Comment: social drinker  . Drug use: Never     Allergies   Latex and Strawberry (diagnostic)   Review of Systems Review of Systems  Constitutional: Negative for activity change.  Respiratory: Negative for shortness of breath.   Cardiovascular: Negative for chest pain.  Gastrointestinal: Positive for abdominal pain.  Genitourinary: Negative for dysuria.  Musculoskeletal: Negative for back pain.  Skin: Negative for rash.  Allergic/Immunologic: Negative for immunocompromised state.  Neurological: Negative for headaches.  Psychiatric/Behavioral: Negative for confusion.     Physical Exam Updated Vital Signs BP 121/77 (BP Location: Right Arm)   Pulse 76   Temp 98.6 F (37 C) (Oral)   Resp 18   Ht 5\' 7"  (1.702 m)   Wt 92.5 kg   LMP 09/11/2018   SpO2 100%   BMI 31.95 kg/m   Physical Exam  Constitutional: No distress.  HENT:  Head: Normocephalic.  Eyes: Conjunctivae are normal.  Neck: Neck supple.  Cardiovascular: Normal rate and regular rhythm. Exam reveals no gallop and no friction rub.  No murmur heard. Pulmonary/Chest: Effort normal. No respiratory distress.  Abdominal: Soft. She exhibits no distension.  Genitourinary: Vagina normal. Right adnexum displays no mass and no tenderness. Left adnexum displays no mass and no tenderness.    Genitourinary Comments: Chaperoned exam. No CMT. no adnexal fullness, masses, or tenderness bilaterally. On the 6-9 o'clock area of the cervix, the tissue appears dark red and not smooth,  but no obvious opening of there cervix. There is a minimal amount of bright red blood when pressing on the cervix, but no active oozing.   Neurological: She is alert.  Skin: Skin is warm. No rash noted.  Psychiatric: Her behavior is normal.  Nursing note and vitals reviewed.    ED Treatments / Results  Labs (all labs ordered are listed, but only abnormal results are displayed) Labs Reviewed  WET PREP, GENITAL - Abnormal; Notable for the following components:      Result Value   Clue Cells Wet Prep HPF POC PRESENT (*)    WBC, Wet Prep HPF POC MODERATE (*)    All other components within normal limits  HCG, QUANTITATIVE, PREGNANCY -  Abnormal; Notable for the following components:   hCG, Beta Chain, Quant, [place] 295,621 (*)    All other components within normal limits  CBC WITH DIFFERENTIAL/PLATELET - Abnormal; Notable for the following components:   WBC 2.3 (*)    RBC 3.67 (*)    Hemoglobin 10.5 (*)    HCT 33.0 (*)    Neutro Abs 1.2 (*)    All other components within normal limits  URINALYSIS, ROUTINE W REFLEX MICROSCOPIC - Abnormal; Notable for the following components:   APPearance HAZY (*)    Specific Gravity, Urine <1.005 (*)    Hgb urine dipstick SMALL (*)    Leukocytes, UA TRACE (*)    All other components within normal limits  COMPREHENSIVE METABOLIC PANEL - Abnormal; Notable for the following components:   Sodium 133 (*)    Potassium 3.3 (*)    CO2 21 (*)    BUN 5 (*)    Total Protein 8.6 (*)    Alkaline Phosphatase 33 (*)    All other components within normal limits  URINALYSIS, MICROSCOPIC (REFLEX) - Abnormal; Notable for the following components:   Bacteria, UA FEW (*)    All other components within normal limits  URINE CULTURE  GC/CHLAMYDIA PROBE AMP ([place]) NOT AT [facility]    EKG None  Radiology US Ob Comp Add'l Gest Less 14 Wks  Result Date: 10/25/2018 CLINICAL DATA:  Vaginal bleeding. Previously demonstrated dichorionic diamniotic twin gestation. Nine  weeks and 2 days pregnant by previous ultrasound. EXAM: TWIN OBSTETRIC <14WK [place] AND TRANSVAGINAL OB US COMPARISON:  10/18/2018. FINDINGS: Number of IUPs:  2 Chorionicity/Amnionicity:  Dichorionic diamniotic TWIN 1 Yolk sac:  Visualized Embryo:  Visualized Cardiac Activity: Visualized Heart Rate: 178 bpm CRL:  27.2 mm   9 w 4 d                  [place] EDC: 05/26/2019. TWIN 2 Yolk sac:  Visualized Embryo:  Visualized Cardiac Activity: Visualized Heart Rate: 171 bpm CRL:  29.2 mm   9 w 5 d                  [place] EDC: 05/06/2019. Subchorionic hemorrhage:  Small Maternal uterus/adnexae: 2.8 cm oval mass in the posterior uterus on the left. Normal appearing maternal right ovary. Two left ovarian corpus luteums. IMPRESSION: 1. Previously demonstrated dichorionic diamniotic twin gestation with an estimated gestational ages of 9 weeks and 4 days and 9 weeks and 5 days. 2. Small subchorionic hemorrhage. 3. 2.8 cm uterine fibroid. Electronically Signed   By: Beckie Salts M.D.   On: 10/25/2018 15:50   US Ob Transvaginal  Result Date: 10/25/2018 CLINICAL DATA:  Vaginal bleeding. Previously demonstrated dichorionic diamniotic twin gestation. Nine weeks and 2 days pregnant by previous ultrasound. EXAM: TWIN OBSTETRIC <14WK [place] AND TRANSVAGINAL OB US COMPARISON:  10/18/2018. FINDINGS: Number of IUPs:  2 Chorionicity/Amnionicity:  Dichorionic diamniotic TWIN 1 Yolk sac:  Visualized Embryo:  Visualized Cardiac Activity: Visualized Heart Rate: 178 bpm CRL:  27.2 mm   9 w 4 d                  [place] EDC: 05/26/2019. TWIN 2 Yolk sac:  Visualized Embryo:  Visualized Cardiac Activity: Visualized Heart Rate: 171 bpm CRL:  29.2 mm   9 w 5 d                  [place] EDC: 05/06/2019. Subchorionic hemorrhage:  Small Maternal uterus/adnexae: 2.8 cm oval  mass in the posterior uterus on the left. Normal appearing maternal right ovary. Two left ovarian corpus luteums. IMPRESSION: 1. Previously demonstrated dichorionic diamniotic twin gestation with an  estimated gestational ages of 9 weeks and 4 days and 9 weeks and 5 days. 2. Small subchorionic hemorrhage. 3. 2.8 cm uterine fibroid. Electronically Signed   By: Beckie SaltsSteven  Reid M.D.   On: 10/25/2018 15:50    Procedures Procedures (including critical care time)  Medications Ordered in ED Medications - No data to display   Initial Impression / Assessment and Plan / ED Course  I have reviewed the triage vital signs and the nursing notes.  Pertinent labs & imaging results that were available during my care of the patient were reviewed by me and considered in my medical decision making (see chart for details).     Mariellen A [name] is a 37 year old female with a history of SLE presenting with left lower quadrant pain and vaginal bleeding, onset yesterday.  Hemoglobin is stable at 10.5.  Beta-hCG is pending.  UA with bacteria and mild hemoglobinuria.  Pelvic ultrasound with small subchorionic hemorrhage and previously demonstrated dichorionic diamniotic twin gestation with an estimated gestational age of [redacted] weeks and 4 days and 9 weeks and 5 days as well as a 2.8 cm uterine fibroid.  Wet prep with clue cells, concerning for bacterial vaginosis.  On pelvic exam, cervix was closed.  The patient was discussed with Dr. Jacqulyn BathLong, attending physician.  Consulted OB/GYN and [facility]  The patient has completed new patient paperwork and is looking to get established with the practice as she is [facility] since she is high risk.  She is also [facility] and was last seen 5 days ago.  Discussed the patient with Dr. Jackelyn KnifeMeisinger who was in agreement with treating the patient with Keflex for asymptomatic bacteria.  He recommended the patient call the office first thing on Monday morning to get an appointment scheduled.  He recommended holding treatment for clue cells for bacterial vaginosis since  metronidazole treatment in the first trimester is questionable.  This plan was discussed with the patient and her husband.  Ectopic pregnancy.  Question threatened abortion versus bleeding from subchorionic hemorrhage.  The patient was given strict return precautions to the emergency department.  She is hemodynamically stable and in no acute distress.  She is safe for discharge home with outpatient follow-up.  Final Clinical Impressions(s) / ED Diagnoses   Final diagnoses:  Subchorionic hemorrhage of placenta in first trimester, single or unspecified fetus  Asymptomatic bacteriuria during pregnancy in first trimester    ED Discharge Orders         Ordered    cephALEXin (KEFLEX) 500 MG capsule  2 times daily     10/25/18 1809           Dilynn Munroe, Coral ElseMia A, PA-C 10/25/18 1815    Maia PlanLong, Joshua G, MD 10/26/18 [phone]

## 2018-10-26 LAB — URINE CULTURE: Culture: 60000 — AB

## 2018-10-28 LAB — GC/CHLAMYDIA PROBE AMP (~~LOC~~) NOT AT ARMC
CHLAMYDIA, DNA PROBE: NEGATIVE
Neisseria Gonorrhea: NEGATIVE

## 2018-11-05 ENCOUNTER — Ambulatory Visit (HOSPITAL_COMMUNITY): Payer: BLUE CROSS/BLUE SHIELD

## 2018-11-14 LAB — OB RESULTS CONSOLE RPR: RPR: NONREACTIVE

## 2018-11-14 LAB — OB RESULTS CONSOLE HIV ANTIBODY (ROUTINE TESTING): HIV: NONREACTIVE

## 2018-11-14 LAB — OB RESULTS CONSOLE RUBELLA ANTIBODY, IGM: Rubella: IMMUNE

## 2018-11-14 LAB — OB RESULTS CONSOLE HEPATITIS B SURFACE ANTIGEN: Hepatitis B Surface Ag: NEGATIVE

## 2018-11-15 ENCOUNTER — Other Ambulatory Visit (HOSPITAL_COMMUNITY): Payer: BLUE CROSS/BLUE SHIELD

## 2018-11-15 ENCOUNTER — Inpatient Hospital Stay (HOSPITAL_COMMUNITY): Admission: RE | Admit: 2018-11-15 | Payer: BLUE CROSS/BLUE SHIELD | Source: Ambulatory Visit

## 2018-11-15 ENCOUNTER — Encounter (HOSPITAL_COMMUNITY): Payer: Self-pay

## 2018-12-04 NOTE — L&D Delivery Note (Addendum)
Delivery Note   Brittany [name], Brittany [name] [[id number]]  After epidural, pt continued to labor well. AROM performed at 420 with pt noted to be completely dilated. Copious clear fluid noted. Vaginal pressure with contractions per pt hence begun pushing. Pt pushed for approx and at 4:53 PM a viable female was delivered via Vaginal, Spontaneous (Presentation:ROA ;  ).  APGAR: 9, 9; weight 6 lb 2.4 oz (2790 g).   Placenta status:delivered ( after baby B) about later, intact, schultz , .  Cord:3vc  with the following complications: none  Anesthesia:  Epidural Episiotomy: None Lacerations: None Est. Blood Loss (mL): 138     Brittany [name], Brittany [name] [[id number]]  After baby A delivered and cord clamped and cut ( after a minute delay). AROM of baby B sac was performed with moderate clear fluid noted. Pt begun to appreciate painful contractions and thus push about later. After pushing for , at 5:08 PM a viable female was delivered via Vaginal, Spontaneous (Presentation:LOA ;  ).  APGAR: 9, 9; weight 5 lb 15.2 oz (2700 g).   Placenta status:delivered about later , .  Cord:3vc  with the following complications:foot cord .  Anesthesia:  Epidural Episiotomy: None Lacerations: None Est. Blood Loss (mL): 138   Mom to postpartum.  Appears fatigued Baby A to Couplet care / Skin to Skin.   Baby B to Couplet care / Skin to Skin   Pt desires circumcision for babies in office.  Cathrine Muster 04/30/2019, 6:04 PM

## 2018-12-11 ENCOUNTER — Ambulatory Visit: Payer: BLUE CROSS/BLUE SHIELD | Admitting: Allergy and Immunology

## 2018-12-11 DIAGNOSIS — J309 Allergic rhinitis, unspecified: Secondary | ICD-10-CM

## 2018-12-12 ENCOUNTER — Other Ambulatory Visit: Payer: Self-pay | Admitting: Allergy and Immunology

## 2018-12-12 DIAGNOSIS — J3089 Other allergic rhinitis: Secondary | ICD-10-CM

## 2018-12-12 DIAGNOSIS — L500 Allergic urticaria: Secondary | ICD-10-CM

## 2019-04-07 ENCOUNTER — Other Ambulatory Visit: Payer: Self-pay

## 2019-04-07 ENCOUNTER — Inpatient Hospital Stay (HOSPITAL_COMMUNITY)
Admission: AD | Admit: 2019-04-07 | Discharge: 2019-04-07 | Disposition: A | Discharge status: Home / Self Care | Payer: BLUE CROSS/BLUE SHIELD | Attending: Obstetrics and Gynecology | Admitting: Obstetrics and Gynecology

## 2019-04-07 DIAGNOSIS — O09529 Supervision of elderly multigravida, unspecified trimester: Secondary | ICD-10-CM | POA: Insufficient documentation

## 2019-04-07 DIAGNOSIS — O3430 Maternal care for cervical incompetence, unspecified trimester: Secondary | ICD-10-CM | POA: Insufficient documentation

## 2019-04-07 DIAGNOSIS — Z3A00 Weeks of gestation of pregnancy not specified: Secondary | ICD-10-CM | POA: Diagnosis not present

## 2019-04-07 MED ORDER — BETAMETHASONE SOD PHOS & ACET 6 (3-3) MG/ML IJ SUSP
12.0000 mg | Freq: Once | INTRAMUSCULAR | Status: AC
  Administered 2019-04-07: 12 mg via INTRAMUSCULAR
  Filled 2019-04-07: qty 2

## 2019-04-07 NOTE — MAU Note (Signed)
Pt sent for BMZ injection. No order, spoke with MAU staff and was told to call Dr. Ellyn Hack. Dr. Ellyn Hack said she spoke to [facility] CNM and she said she would take care of it. Placed verbal order per MAU APPs.

## 2019-04-08 ENCOUNTER — Other Ambulatory Visit: Payer: Self-pay

## 2019-04-08 ENCOUNTER — Inpatient Hospital Stay (HOSPITAL_COMMUNITY)
Admission: AD | Admit: 2019-04-08 | Discharge: 2019-04-08 | Disposition: A | Discharge status: Home / Self Care | Payer: BLUE CROSS/BLUE SHIELD | Attending: Obstetrics and Gynecology | Admitting: Obstetrics and Gynecology

## 2019-04-08 DIAGNOSIS — O09529 Supervision of elderly multigravida, unspecified trimester: Secondary | ICD-10-CM | POA: Insufficient documentation

## 2019-04-08 DIAGNOSIS — Z3A00 Weeks of gestation of pregnancy not specified: Secondary | ICD-10-CM | POA: Diagnosis not present

## 2019-04-08 MED ORDER — BETAMETHASONE SOD PHOS & ACET 6 (3-3) MG/ML IJ SUSP
12.0000 mg | Freq: Once | INTRAMUSCULAR | Status: AC
  Administered 2019-04-08: 12 mg via INTRAMUSCULAR
  Filled 2019-04-08: qty 2

## 2019-04-08 NOTE — MAU Note (Signed)
Pt given betamethasone injection. No other problems noted.

## 2019-04-30 ENCOUNTER — Inpatient Hospital Stay (HOSPITAL_COMMUNITY): Payer: Medicaid Other | Admitting: Anesthesiology

## 2019-04-30 ENCOUNTER — Other Ambulatory Visit: Payer: Self-pay

## 2019-04-30 ENCOUNTER — Encounter (HOSPITAL_COMMUNITY): Payer: Self-pay | Admitting: *Deleted

## 2019-04-30 ENCOUNTER — Inpatient Hospital Stay (HOSPITAL_COMMUNITY)
Admission: AD | Admit: 2019-04-30 | Discharge: 2019-05-02 | DRG: 805 | Disposition: A | Discharge status: Home / Self Care | Payer: Medicaid Other | Attending: Obstetrics and Gynecology | Admitting: Obstetrics and Gynecology

## 2019-04-30 DIAGNOSIS — Z1159 Encounter for screening for other viral diseases: Secondary | ICD-10-CM | POA: Diagnosis not present

## 2019-04-30 DIAGNOSIS — R42 Dizziness and giddiness: Secondary | ICD-10-CM | POA: Diagnosis not present

## 2019-04-30 DIAGNOSIS — G8929 Other chronic pain: Secondary | ICD-10-CM | POA: Diagnosis not present

## 2019-04-30 DIAGNOSIS — O30043 Twin pregnancy, dichorionic/diamniotic, third trimester: Secondary | ICD-10-CM | POA: Diagnosis present

## 2019-04-30 DIAGNOSIS — O9902 Anemia complicating childbirth: Secondary | ICD-10-CM | POA: Diagnosis present

## 2019-04-30 DIAGNOSIS — D649 Anemia, unspecified: Secondary | ICD-10-CM | POA: Diagnosis not present

## 2019-04-30 DIAGNOSIS — R112 Nausea with vomiting, unspecified: Secondary | ICD-10-CM | POA: Diagnosis not present

## 2019-04-30 DIAGNOSIS — Z9189 Other specified personal risk factors, not elsewhere classified: Secondary | ICD-10-CM | POA: Diagnosis not present

## 2019-04-30 DIAGNOSIS — R109 Unspecified abdominal pain: Secondary | ICD-10-CM | POA: Diagnosis not present

## 2019-04-30 DIAGNOSIS — O9989 Other specified diseases and conditions complicating pregnancy, childbirth and the puerperium: Secondary | ICD-10-CM | POA: Diagnosis present

## 2019-04-30 DIAGNOSIS — Z79899 Other long term (current) drug therapy: Secondary | ICD-10-CM | POA: Diagnosis not present

## 2019-04-30 DIAGNOSIS — R21 Rash and other nonspecific skin eruption: Secondary | ICD-10-CM | POA: Diagnosis not present

## 2019-04-30 DIAGNOSIS — R12 Heartburn: Secondary | ICD-10-CM | POA: Diagnosis not present

## 2019-04-30 DIAGNOSIS — Z3A36 36 weeks gestation of pregnancy: Secondary | ICD-10-CM

## 2019-04-30 DIAGNOSIS — M545 Low back pain: Secondary | ICD-10-CM | POA: Diagnosis not present

## 2019-04-30 LAB — GROUP B STREP BY PCR: Group B strep by PCR: NEGATIVE

## 2019-04-30 LAB — CBC
HCT: 38.5 % (ref 36.0–46.0)
HCT: 35 % — ABNORMAL LOW (ref 36.0–46.0)
Hemoglobin: 12.6 g/dL (ref 12.0–15.0)
Hemoglobin: 11.5 g/dL — ABNORMAL LOW (ref 12.0–15.0)
MCH: 29.4 pg (ref 26.0–34.0)
MCH: 30.2 pg (ref 26.0–34.0)
MCHC: 32.7 g/dL (ref 30.0–36.0)
MCHC: 32.9 g/dL (ref 30.0–36.0)
MCV: 90 fL (ref 80.0–100.0)
MCV: 91.9 fL (ref 80.0–100.0)
Platelets: 131 10*3/uL — ABNORMAL LOW (ref 150–400)
Platelets: 132 10*3/uL — ABNORMAL LOW (ref 150–400)
RBC: 4.28 MIL/uL (ref 3.87–5.11)
RBC: 3.81 MIL/uL — ABNORMAL LOW (ref 3.87–5.11)
RDW: 19.2 % — ABNORMAL HIGH (ref 11.5–15.5)
RDW: 19.5 % — ABNORMAL HIGH (ref 11.5–15.5)
WBC: 6.3 10*3/uL (ref 4.0–10.5)
WBC: 8.2 10*3/uL (ref 4.0–10.5)
nRBC: 0.3 % — ABNORMAL HIGH (ref 0.0–0.2)
nRBC: 0.4 % — ABNORMAL HIGH (ref 0.0–0.2)

## 2019-04-30 LAB — TYPE AND SCREEN
ABO/RH(D): A POS
Antibody Screen: NEGATIVE

## 2019-04-30 LAB — SARS CORONAVIRUS 2 BY RT PCR (HOSPITAL ORDER, PERFORMED IN ~~LOC~~ HOSPITAL LAB): SARS Coronavirus 2: NEGATIVE

## 2019-04-30 MED ORDER — ONDANSETRON HCL 4 MG/2ML IJ SOLN
4.0000 mg | INTRAMUSCULAR | Status: DC | PRN
  Administered 2019-05-01 (×2): 4 mg via INTRAVENOUS
  Filled 2019-04-30 (×2): qty 2

## 2019-04-30 MED ORDER — OXYTOCIN 40 UNITS IN NORMAL SALINE INFUSION - SIMPLE MED
2.5000 [IU]/h | INTRAVENOUS | Status: DC
  Administered 2019-04-30: 18:00:00 2.5 [IU]/h via INTRAVENOUS
  Filled 2019-04-30: qty 1000

## 2019-04-30 MED ORDER — PHENYLEPHRINE 40 MCG/ML (10ML) SYRINGE FOR IV PUSH (FOR BLOOD PRESSURE SUPPORT): 80.0000 ug | PREFILLED_SYRINGE | INTRAVENOUS | Status: DC | PRN

## 2019-04-30 MED ORDER — LACTATED RINGERS IV SOLN: INTRAVENOUS | Status: DC

## 2019-04-30 MED ORDER — SENNOSIDES-DOCUSATE SODIUM 8.6-50 MG PO TABS
2.0000 | ORAL_TABLET | ORAL | Status: DC
  Administered 2019-04-30 – 2019-05-01 (×2): 2 via ORAL
  Filled 2019-04-30 (×2): qty 2

## 2019-04-30 MED ORDER — DIPHENHYDRAMINE HCL 25 MG PO CAPS: 25.0000 mg | ORAL_CAPSULE | Freq: Four times a day (QID) | ORAL | Status: DC | PRN

## 2019-04-30 MED ORDER — WITCH HAZEL-GLYCERIN EX PADS: 1.0000 "application " | MEDICATED_PAD | CUTANEOUS | Status: DC | PRN

## 2019-04-30 MED ORDER — ONDANSETRON HCL 4 MG PO TABS: 4.0000 mg | ORAL_TABLET | ORAL | Status: DC | PRN

## 2019-04-30 MED ORDER — ACETAMINOPHEN 325 MG PO TABS
650.0000 mg | ORAL_TABLET | ORAL | Status: DC | PRN
  Administered 2019-04-30: 22:00:00 650 mg via ORAL
  Filled 2019-04-30: qty 2

## 2019-04-30 MED ORDER — COCONUT OIL OIL: 1.0000 "application " | TOPICAL_OIL | Status: DC | PRN

## 2019-04-30 MED ORDER — FENTANYL-BUPIVACAINE-NACL 0.5-0.125-0.9 MG/250ML-% EP SOLN: 12.0000 mL/h | EPIDURAL | Status: DC | PRN

## 2019-04-30 MED ORDER — ONDANSETRON HCL 4 MG/2ML IJ SOLN
4.0000 mg | Freq: Four times a day (QID) | INTRAMUSCULAR | Status: DC | PRN
  Administered 2019-04-30: 4 mg via INTRAVENOUS
  Filled 2019-04-30: qty 2

## 2019-04-30 MED ORDER — LIDOCAINE HCL (PF) 1 % IJ SOLN
INTRAMUSCULAR | Status: DC | PRN
  Administered 2019-04-30: 6 mL via EPIDURAL

## 2019-04-30 MED ORDER — SOD CITRATE-CITRIC ACID 500-334 MG/5ML PO SOLN
30.0000 mL | ORAL | Status: DC | PRN
  Administered 2019-04-30: 30 mL via ORAL
  Filled 2019-04-30 (×2): qty 30

## 2019-04-30 MED ORDER — ZOLPIDEM TARTRATE 5 MG PO TABS: 5.0000 mg | ORAL_TABLET | Freq: Every evening | ORAL | Status: DC | PRN

## 2019-04-30 MED ORDER — DIPHENHYDRAMINE HCL 50 MG/ML IJ SOLN: 12.5000 mg | INTRAMUSCULAR | Status: DC | PRN

## 2019-04-30 MED ORDER — HYDROXYCHLOROQUINE SULFATE 200 MG PO TABS
300.0000 mg | ORAL_TABLET | Freq: Every day | ORAL | Status: DC
  Administered 2019-05-01 – 2019-05-02 (×2): 300 mg via ORAL
  Filled 2019-04-30 (×4): qty 1.5

## 2019-04-30 MED ORDER — OXYCODONE HCL 5 MG PO TABS: 10.0000 mg | ORAL_TABLET | ORAL | Status: DC | PRN

## 2019-04-30 MED ORDER — LACTATED RINGERS IV SOLN: 500.0000 mL | INTRAVENOUS | Status: DC | PRN

## 2019-04-30 MED ORDER — OXYTOCIN BOLUS FROM INFUSION
500.0000 mL | Freq: Once | INTRAVENOUS | Status: AC
  Administered 2019-04-30: 18:00:00 500 mL via INTRAVENOUS

## 2019-04-30 MED ORDER — IBUPROFEN 600 MG PO TABS
600.0000 mg | ORAL_TABLET | Freq: Four times a day (QID) | ORAL | Status: DC
  Administered 2019-04-30 – 2019-05-02 (×7): 600 mg via ORAL
  Filled 2019-04-30 (×8): qty 1

## 2019-04-30 MED ORDER — EPHEDRINE 5 MG/ML INJ: 10.0000 mg | INTRAVENOUS | Status: DC | PRN

## 2019-04-30 MED ORDER — OXYCODONE-ACETAMINOPHEN 5-325 MG PO TABS: 1.0000 | ORAL_TABLET | ORAL | Status: DC | PRN

## 2019-04-30 MED ORDER — LIDOCAINE HCL (PF) 1 % IJ SOLN: 30.0000 mL | INTRAMUSCULAR | Status: DC | PRN

## 2019-04-30 MED ORDER — ACETAMINOPHEN 325 MG PO TABS: 650.0000 mg | ORAL_TABLET | ORAL | Status: DC | PRN

## 2019-04-30 MED ORDER — BUTORPHANOL TARTRATE 1 MG/ML IJ SOLN: 1.0000 mg | INTRAMUSCULAR | Status: DC | PRN

## 2019-04-30 MED ORDER — TETANUS-DIPHTH-ACELL PERTUSSIS 5-2.5-18.5 LF-MCG/0.5 IM SUSP: 0.5000 mL | Freq: Once | INTRAMUSCULAR | Status: DC

## 2019-04-30 MED ORDER — HYDROXYCHLOROQUINE SULFATE 200 MG PO TABS
200.0000 mg | ORAL_TABLET | Freq: Every day | ORAL | Status: DC
  Filled 2019-04-30: qty 1

## 2019-04-30 MED ORDER — DIBUCAINE (PERIANAL) 1 % EX OINT: 1.0000 "application " | TOPICAL_OINTMENT | CUTANEOUS | Status: DC | PRN

## 2019-04-30 MED ORDER — LACTATED RINGERS IV SOLN: 500.0000 mL | Freq: Once | INTRAVENOUS | Status: DC

## 2019-04-30 MED ORDER — SODIUM CHLORIDE 0.9 % IV SOLN
2.0000 g | Freq: Once | INTRAVENOUS | Status: AC
  Administered 2019-04-30: 14:00:00 2 g via INTRAVENOUS
  Filled 2019-04-30: qty 2000

## 2019-04-30 MED ORDER — BENZOCAINE-MENTHOL 20-0.5 % EX AERO
1.0000 "application " | INHALATION_SPRAY | CUTANEOUS | Status: DC | PRN
  Administered 2019-04-30: 1 via TOPICAL
  Filled 2019-04-30: qty 56

## 2019-04-30 MED ORDER — PRENATAL MULTIVITAMIN CH
1.0000 | ORAL_TABLET | Freq: Every day | ORAL | Status: DC
  Administered 2019-05-01 – 2019-05-02 (×2): 1 via ORAL
  Filled 2019-04-30 (×2): qty 1

## 2019-04-30 MED ORDER — SIMETHICONE 80 MG PO CHEW: 80.0000 mg | CHEWABLE_TABLET | ORAL | Status: DC | PRN

## 2019-04-30 MED ORDER — FENTANYL-BUPIVACAINE-NACL 0.5-0.125-0.9 MG/250ML-% EP SOLN
EPIDURAL | Status: AC
  Filled 2019-04-30: qty 250

## 2019-04-30 MED ORDER — PHENYLEPHRINE 40 MCG/ML (10ML) SYRINGE FOR IV PUSH (FOR BLOOD PRESSURE SUPPORT)
PREFILLED_SYRINGE | INTRAVENOUS | Status: AC
  Filled 2019-04-30: qty 10

## 2019-04-30 MED ORDER — OXYCODONE-ACETAMINOPHEN 5-325 MG PO TABS: 2.0000 | ORAL_TABLET | ORAL | Status: DC | PRN

## 2019-04-30 MED ORDER — SODIUM CHLORIDE (PF) 0.9 % IJ SOLN
INTRAMUSCULAR | Status: DC | PRN
  Administered 2019-04-30: 12 mL/h via EPIDURAL

## 2019-04-30 MED ORDER — OXYCODONE HCL 5 MG PO TABS: 5.0000 mg | ORAL_TABLET | ORAL | Status: DC | PRN

## 2019-04-30 NOTE — H&P (Signed)
Samiah A Murph-Brownlee is a 38 y.[id number]  female presenting at 39 2/7 wks with twin gestation for vaginal bleeding and contractions. Pt noted to be 6cm dilated with vertex/vertex twins in MAU. Pt is dated per 9 week [place]. Pregnancy has been complicated by AMA ( panorama low risk; fraternal twins); anemia of pregnancy - on iron supplements, SLE - well-controlled on Plaquenil and prednisone. Pt had preterm labor 3 weeks ago - received course of BMZ. She is GBS unknown.  OB History    Gravida  5   Para  1   Term  1   Preterm      AB  3   Living  1     SAB  2   TAB  1   Ectopic      Multiple      Live Births             Past Medical History:  Diagnosis Date  . Anemia   . Lupus (HCC)   . Sarcoidosis   . Urticaria    Past Surgical History:  Procedure Laterality Date  . BIOPSY THYROID    . DILATION AND CURETTAGE OF UTERUS    . DILATION AND CURETTAGE, DIAGNOSTIC / THERAPEUTIC  01/04/2018   Family History: family history includes Allergic rhinitis in her brother and father; Allergy (severe) in her father; Anemia in her mother; Food Allergy in her mother; Heart disease in her father and paternal grandfather; Hypertension in her brother and mother; Prostate cancer in her father. Social History:  reports that she has never smoked. She has never used smokeless tobacco. She reports previous alcohol use of about 1.0 standard drinks of alcohol per week. She reports that she does not use drugs.     Maternal Diabetes: No Genetic Screening: Normal Maternal Ultrasounds/Referrals: Normal Fetal Ultrasounds or other Referrals:  None Maternal Substance Abuse:  No Significant Maternal Medications:  Meds include: Other: see hpi Significant Maternal Lab Results:  Lab values include: Other: GBS unknown Other Comments:  None  ROS History Dilation: 6 Effacement (%): 90 Exam by:: GInger Morris rn Blood pressure (!) 139/94, pulse (!) 121, resp. rate 20, weight 94.9 kg, last menstrual  period 09/11/2018, SpO2 100 %. Exam Physical Exam  Prenatal labs: ABO, Rh:   Antibody:   Rubella:   RPR:    HBsAg:    HIV:    GBS:     Assessment/Plan: 38yo [id number] at 39 2/7wks with twin gestation in active preterm labor Admit Pain control per pt request - recommend epidural Augment if indicated Ampicillin for GBS treatment while await rapid GBS result Anticipate svd x 2   Jaequan Propes W Malyiah Fellows 04/30/2019, 1:09 PM

## 2019-04-30 NOTE — MAU Note (Signed)
Pt c/o vaginal bleeding that started an hour ago, worsening cramping. No LOF, +FM

## 2019-04-30 NOTE — Anesthesia Preprocedure Evaluation (Signed)
Anesthesia Evaluation  Patient identified by MRN, date of birth, ID band Patient awake    Reviewed: Allergy & Precautions, H&P , NPO status , Patient's Chart, lab work & pertinent test results, reviewed documented beta blocker date and time   Airway Mallampati: III  TM Distance: >3 FB Neck ROM: full    Dental no notable dental hx. (+) Teeth Intact   Pulmonary neg pulmonary ROS,    Pulmonary exam normal breath sounds clear to auscultation       Cardiovascular negative cardio ROS Normal cardiovascular exam Rhythm:regular Rate:Normal     Neuro/Psych negative neurological ROS  negative psych ROS   GI/Hepatic negative GI ROS, Neg liver ROS,   Endo/Other  negative endocrine ROS  Renal/GU negative Renal ROS  negative genitourinary   Musculoskeletal   Abdominal   Peds  Hematology  (+) Blood dyscrasia, anemia ,   Anesthesia Other Findings   Reproductive/Obstetrics (+) Pregnancy                             Anesthesia Physical Anesthesia Plan  ASA: III  Anesthesia Plan: Epidural   Post-op Pain Management:    Induction:   PONV Risk Score and Plan: 2 and Treatment may vary due to age or medical condition  Airway Management Planned:   Additional Equipment:   Intra-op Plan:   Post-operative Plan:   Informed Consent: I have reviewed the patients History and Physical, chart, labs and discussed the procedure including the risks, benefits and alternatives for the proposed anesthesia with the patient or authorized representative who has indicated his/her understanding and acceptance.       Plan Discussed with: CRNA, Anesthesiologist and Surgeon  Anesthesia Plan Comments:         Anesthesia Quick Evaluation

## 2019-04-30 NOTE — Anesthesia Postprocedure Evaluation (Signed)
Anesthesia Post Note  Patient: Brittany [name]  Procedure(s) Performed: AN AD HOC LABOR EPIDURAL     Patient location during evaluation: Mother Baby Anesthesia Type: Epidural Level of consciousness: awake and alert Pain management: pain level controlled Vital Signs Assessment: post-procedure vital signs reviewed and stable Respiratory status: spontaneous breathing, nonlabored ventilation and respiratory function stable Cardiovascular status: stable Postop Assessment: no headache, no backache and epidural receding Anesthetic complications: no    Last Vitals:  Vitals:   04/30/19 1936 04/30/19 1947  BP:  132/87  Pulse:  (!) 115  Resp:    Temp:    SpO2: 100%     Last Pain:  Vitals:   04/30/19 1922  TempSrc:   PainSc: 4                  Zamarion Longest

## 2019-05-01 ENCOUNTER — Other Ambulatory Visit: Payer: Self-pay

## 2019-05-01 LAB — CBC
HCT: 30.8 % — ABNORMAL LOW (ref 36.0–46.0)
Hemoglobin: 10.4 g/dL — ABNORMAL LOW (ref 12.0–15.0)
MCH: 30 pg (ref 26.0–34.0)
MCHC: 33.8 g/dL (ref 30.0–36.0)
MCV: 88.8 fL (ref 80.0–100.0)
Platelets: 125 10*3/uL — ABNORMAL LOW (ref 150–400)
RBC: 3.47 MIL/uL — ABNORMAL LOW (ref 3.87–5.11)
RDW: 18.6 % — ABNORMAL HIGH (ref 11.5–15.5)
WBC: 8.6 10*3/uL (ref 4.0–10.5)
nRBC: 0.6 % — ABNORMAL HIGH (ref 0.0–0.2)

## 2019-05-01 LAB — ABO/RH: ABO/RH(D): A POS

## 2019-05-01 LAB — RPR: RPR Ser Ql: NONREACTIVE

## 2019-05-01 MED ORDER — TRIAMCINOLONE 0.1 % CREAM:EUCERIN CREAM 1:1
TOPICAL_CREAM | Freq: Two times a day (BID) | CUTANEOUS | Status: DC | PRN
  Administered 2019-05-01: 18:00:00 via TOPICAL
  Filled 2019-05-01: qty 1

## 2019-05-01 MED ORDER — LORATADINE 10 MG PO TABS
10.0000 mg | ORAL_TABLET | Freq: Every day | ORAL | Status: DC
  Administered 2019-05-01: 10 mg via ORAL
  Filled 2019-05-01: qty 1

## 2019-05-01 MED ORDER — MORPHINE SULFATE (PF) 2 MG/ML IV SOLN
2.0000 mg | Freq: Once | INTRAVENOUS | Status: AC
  Administered 2019-05-01: 21:00:00 2 mg via INTRAVENOUS
  Filled 2019-05-01: qty 1

## 2019-05-01 NOTE — Addendum Note (Signed)
Addendum  created 05/01/19 1518 by Orlie Pollen, CRNA   Clinical Note Signed

## 2019-05-01 NOTE — Progress Notes (Signed)
Post Partum Day 1 Subjective: no complaints, up ad lib, voiding, tolerating PO and nl locia, pain controlled  Objective: Blood pressure (!) 129/92, pulse 96, temperature 97.6 F (36.4 C), temperature source Oral, resp. rate 17, weight 94.9 kg, last menstrual period 09/11/2018, SpO2 100 %, unknown if currently breastfeeding.  Physical Exam:  General: alert and no distress Lochia: appropriate Uterine Fundus: firm   Recent Labs    04/30/19 2119 05/01/19 0543  HGB 11.5* 10.4*  HCT 35.0* 30.8*    Assessment/Plan: Plan for discharge tomorrow.  Routine PP care.     LOS: 1 day   Brittany [name] 05/01/2019, 7:18 AM

## 2019-05-01 NOTE — Anesthesia Postprocedure Evaluation (Signed)
Anesthesia Post Note  Patient: Brittany [name]  Procedure(s) Performed: AN AD HOC LABOR EPIDURAL     Patient location during evaluation: Mother Baby Anesthesia Type: Epidural Level of consciousness: awake and alert Pain management: pain level controlled Vital Signs Assessment: post-procedure vital signs reviewed and stable Respiratory status: spontaneous breathing, nonlabored ventilation and respiratory function stable Cardiovascular status: stable Postop Assessment: no headache, no backache and epidural receding Anesthetic complications: no    Last Vitals:  Vitals:   05/01/19 0900 05/01/19 1425  BP: 127/83 (!) 140/91  Pulse: 82 90  Resp: 18 18  Temp: 36.6 C 36.6 C  SpO2:      Last Pain:  Vitals:   05/01/19 1425  TempSrc: Oral  PainSc:    Pain Goal:                   [employer]

## 2019-05-01 NOTE — Progress Notes (Addendum)
CSW received consult to "assess readiness for twins; maternal fatigue affecting bonding immediately after delivery".  CSW met with MOB to offer support and complete assessment.    MOB resting in bed with twins asleep in their individual basinets and FOB asleep on the couch, when CSW and RN entered the room. RN completed assessment on twins while CSW gathered basic information from MOB. MOB reported feeling very tired and stated laboring process was "an adventure". MOB shared that she is happy that it's over. Per MOB, she currently lives by herself and MOB reported she has one other daughter who is 20-years-old. MOB denied having any mental health history or PPD with first child but was open to PMADs education. CSW provided education regarding the baby blues period vs. perinatal mood disorders, discussed treatment and gave resources for mental health follow up if concerns arise.  CSW recommends self-evaluation during the postpartum time period using the New Mom Checklist from Postpartum Progress and encouraged MOB to contact a medical professional if symptoms are noted at any time. MOB denied any current SI or HI and reported having support from FOB, her mother and her father.   MOB verified having all essential items for infants once home and reported infants would be sleeping in their own basinets and/or cribs. CSW provided review of Sudden Infant Death Syndrome (SIDS) precautions and safe sleeping habits.    CSW spoke with RN who reported MOB seemed to be doing better. CSW will continue to follow to assess MOB's engagement and readiness and to offer support, as MOB very tired at time of assessment.   Jasher Barkan Irwin, LCSWA  [facility] [phone]   

## 2019-05-01 NOTE — Progress Notes (Signed)
Rash on both hands remains the same. Patient not complaining about the rash.

## 2019-05-02 ENCOUNTER — Encounter (HOSPITAL_COMMUNITY): Payer: Self-pay

## 2019-05-02 ENCOUNTER — Other Ambulatory Visit: Payer: Self-pay

## 2019-05-02 ENCOUNTER — Inpatient Hospital Stay (HOSPITAL_COMMUNITY)
Admission: AD | Admit: 2019-05-02 | Discharge: 2019-05-02 | Disposition: A | Discharge status: Home / Self Care | Payer: Medicaid Other | Source: Ambulatory Visit | Attending: Obstetrics and Gynecology | Admitting: Obstetrics and Gynecology

## 2019-05-02 DIAGNOSIS — M545 Low back pain: Secondary | ICD-10-CM | POA: Insufficient documentation

## 2019-05-02 DIAGNOSIS — R112 Nausea with vomiting, unspecified: Secondary | ICD-10-CM | POA: Insufficient documentation

## 2019-05-02 DIAGNOSIS — R12 Heartburn: Secondary | ICD-10-CM | POA: Insufficient documentation

## 2019-05-02 DIAGNOSIS — Z9189 Other specified personal risk factors, not elsewhere classified: Secondary | ICD-10-CM

## 2019-05-02 DIAGNOSIS — O9989 Other specified diseases and conditions complicating pregnancy, childbirth and the puerperium: Secondary | ICD-10-CM | POA: Diagnosis not present

## 2019-05-02 DIAGNOSIS — Z711 Person with feared health complaint in whom no diagnosis is made: Secondary | ICD-10-CM

## 2019-05-02 DIAGNOSIS — R42 Dizziness and giddiness: Secondary | ICD-10-CM | POA: Diagnosis not present

## 2019-05-02 DIAGNOSIS — R21 Rash and other nonspecific skin eruption: Secondary | ICD-10-CM | POA: Diagnosis not present

## 2019-05-02 DIAGNOSIS — G8929 Other chronic pain: Secondary | ICD-10-CM

## 2019-05-02 DIAGNOSIS — Z79899 Other long term (current) drug therapy: Secondary | ICD-10-CM | POA: Insufficient documentation

## 2019-05-02 DIAGNOSIS — R109 Unspecified abdominal pain: Secondary | ICD-10-CM | POA: Insufficient documentation

## 2019-05-02 LAB — COMPREHENSIVE METABOLIC PANEL
ALT: 19 U/L (ref 0–44)
AST: 30 U/L (ref 15–41)
Albumin: 1.6 g/dL — ABNORMAL LOW (ref 3.5–5.0)
Alkaline Phosphatase: 179 U/L — ABNORMAL HIGH (ref 38–126)
Anion gap: 9 (ref 5–15)
BUN: 9 mg/dL (ref 6–20)
CO2: 22 mmol/L (ref 22–32)
Calcium: 9 mg/dL (ref 8.9–10.3)
Chloride: 100 mmol/L (ref 98–111)
Creatinine, Ser: 0.87 mg/dL (ref 0.44–1.00)
GFR calc Af Amer: >60 mL/min (ref >60)
GFR calc non Af Amer: >60 mL/min (ref >60)
Glucose, Bld: 85 mg/dL (ref 70–99)
Potassium: 3.9 mmol/L (ref 3.5–5.1)
Sodium: 131 mmol/L — ABNORMAL LOW (ref 135–145)
Total Bilirubin: 0.7 mg/dL (ref 0.3–1.2)
Total Protein: 5.9 g/dL — ABNORMAL LOW (ref 6.5–8.1)

## 2019-05-02 LAB — CBC WITH DIFFERENTIAL/PLATELET
Abs Immature Granulocytes: 0.25 10*3/uL — ABNORMAL HIGH (ref 0.00–0.07)
Basophils Absolute: 0 10*3/uL (ref 0.0–0.1)
Basophils Relative: 0 %
Eosinophils Absolute: 0.1 10*3/uL (ref 0.0–0.5)
Eosinophils Relative: 1 %
HCT: 30.7 % — ABNORMAL LOW (ref 36.0–46.0)
Hemoglobin: 10 g/dL — ABNORMAL LOW (ref 12.0–15.0)
Immature Granulocytes: 4 %
Lymphocytes Relative: 14 %
Lymphs Abs: 1 10*3/uL (ref 0.7–4.0)
MCH: 30 pg (ref 26.0–34.0)
MCHC: 32.6 g/dL (ref 30.0–36.0)
MCV: 92.2 fL (ref 80.0–100.0)
Monocytes Absolute: 0.7 10*3/uL (ref 0.1–1.0)
Monocytes Relative: 10 %
Neutro Abs: 5 10*3/uL (ref 1.7–7.7)
Neutrophils Relative %: 71 %
Platelets: 151 10*3/uL (ref 150–400)
RBC: 3.33 MIL/uL — ABNORMAL LOW (ref 3.87–5.11)
RDW: 19.2 % — ABNORMAL HIGH (ref 11.5–15.5)
WBC: 7 10*3/uL (ref 4.0–10.5)
nRBC: 0.4 % — ABNORMAL HIGH (ref 0.0–0.2)

## 2019-05-02 MED ORDER — FAMOTIDINE 20 MG PO TABS
20.0000 mg | ORAL_TABLET | Freq: Once | ORAL | Status: AC
  Administered 2019-05-02: 20 mg via ORAL
  Filled 2019-05-02: qty 1

## 2019-05-02 MED ORDER — IBUPROFEN 600 MG PO TABS: 600.0000 mg | ORAL_TABLET | Freq: Four times a day (QID) | ORAL | 0 refills | Status: DC

## 2019-05-02 MED ORDER — OXYCODONE HCL 5 MG PO TABS: 5.0000 mg | ORAL_TABLET | ORAL | 0 refills | Status: DC | PRN

## 2019-05-02 MED ORDER — ONDANSETRON 4 MG PO TBDP: 8.0000 mg | ORAL_TABLET | Freq: Once | ORAL | Status: DC

## 2019-05-02 MED ORDER — ACETAMINOPHEN 325 MG PO TABS: 650.0000 mg | ORAL_TABLET | ORAL | 0 refills | Status: AC | PRN

## 2019-05-02 MED ORDER — OXYCODONE HCL 5 MG PO TABS
10.0000 mg | ORAL_TABLET | Freq: Once | ORAL | Status: AC
  Administered 2019-05-02: 10 mg via ORAL
  Filled 2019-05-02: qty 2

## 2019-05-02 NOTE — Discharge Summary (Signed)
OB Discharge Summary     Patient Name: Brittany [name] DOB: Jun 09, 1981 MRN: [id number]  Date of admission: 04/30/2019 Delivering MD:    Shawna Clamp Sheza [[id number]]  [facility]   Murph[place], [place] Cathi [[id number]]  [facility]   Date of discharge: 05/02/2019  Admitting diagnosis: 36WKS BLEEDING,CRAMPING Intrauterine pregnancy: 101w2d     Secondary diagnosis:  Active Problems:   Indication for care in labor or delivery   Postpartum care following vaginal delivery  Additional problems: none     Discharge diagnosis: Preterm Pregnancy Delivered                                                                                                Post partum procedures:none  Augmentation: AROM  Complications: None  Hospital course:  Onset of Labor With Vaginal Delivery     38 y.o. yo [id number] at [redacted]w[redacted]d was admitted in Active Labor on 04/30/2019. Patient had an uncomplicated labor course as follows: NSVD x 2, no lacerations. Membrane Rupture Time/Date:    Kimber, [place] [[id number]]  4:35 PM   Shanel, Contreras [[id number]]  4:58 PM ,   Larah, Showers [[id number]]  04/30/2019   Shavanda, Maliszewski Rayette [[id number]]  04/30/2019   Intrapartum Procedures: Episiotomy:    Mackenzie, Kooiman Fleeta [[id number]]  None [1]   Zadaya, Reliford Simmone [[id number]]  None [1]                                         Lacerations:     Jakarra, Leithead Jaskirat [[id number]]  None [1]   Kem, Clasen Aicha [[id number]]  None [1]  Patient had a delivery of a Viable infant.   Jaelan, Teater [[id number]]  04/30/2019   Kathren, Char Gwendlyon [[id number]]  04/30/2019 Information for the patient's newborn:  Kenli, Winning [[id number]]  Delivery Method: Vaginal, Spontaneous(Filed from Delivery Summary) Information for the patient's newborn:  Lekeisha, Hochstrasser [[id number]]       Pateint had an uncomplicated postpartum course.  She is ambulating, tolerating a regular diet, passing flatus, and urinating well. Patient is discharged home in stable condition on 05/02/19.   Physical exam  Vitals:   05/01/19 1425 05/01/19 1745 05/01/19 2134 05/02/19 0601  BP: (!) 140/91 132/89 (!) 128/91 120/82  Pulse: 90 95 96 (!) 114  Resp: 18 18 18 18   Temp: 97.9 F (36.6 C)  97.9 F (36.6 C) 97.7 F (36.5 C)  TempSrc: Oral  Axillary Oral  SpO2:   100% 100%  Weight:    95 kg  Height:    5\' 8"  (1.727 m)   General: alert and cooperative Lochia: appropriate Uterine Fundus: firm  Labs: Lab Results  Component Value Date   WBC 8.6 05/01/2019   HGB 10.4 (L) 05/01/2019   HCT 30.8 (L) 05/01/2019   MCV 88.8 05/01/2019   PLT 125 (L) 05/01/2019   CMP Latest Ref Rng & Units 10/25/2018  Glucose 70 - 99 mg/dL 87  BUN 6 -  20 mg/dL 5(L)  Creatinine 2.950.44 - 1.00 mg/dL 6.210.45  Sodium 308135 - 657145 mmol/L 133(L)  Potassium 3.5 - 5.1 mmol/L 3.3(L)  Chloride 98 - 111 mmol/L 103  CO2 22 - 32 mmol/L 21(L)  Calcium 8.9 - 10.3 mg/dL 9.4  Total Protein 6.5 - 8.1 g/dL 8.4(O8.6(H)  Total Bilirubin 0.3 - 1.2 mg/dL 0.5  Alkaline Phos 38 - 126 U/L 33(L)  AST 15 - 41 U/L 18  ALT 0 - 44 U/L 11    Discharge instruction: per After Visit Summary and "Baby and Me Booklet".  After visit meds:  Allergies as of 05/02/2019      Reactions   Latex Hives   Strawberry (diagnostic) Rash      Medication List    TAKE these medications   acetaminophen 325 MG tablet Commonly known as:  Tylenol Take 2 tablets (650 mg total) by mouth every 4 (four) hours as needed (for pain scale < 4).   hydroxychloroquine 200 MG tablet Commonly known as:  PLAQUENIL Take by mouth.   ibuprofen 600 MG tablet Commonly known as:  ADVIL Take 1 tablet (600 mg total) by mouth every 6 (six) hours.   montelukast 10 MG tablet Commonly known as:  SINGULAIR TAKE 1 TABLET BY MOUTH AT BEDTIME    oxyCODONE 5 MG immediate release tablet Commonly known as:  Oxy IR/ROXICODONE Take 1 tablet (5 mg total) by mouth every 4 (four) hours as needed (pain scale 4-7).   predniSONE 5 MG tablet Commonly known as:  DELTASONE 5 mg daily as needed.   prenatal multivitamin Tabs tablet Take 1 tablet by mouth daily at 12 noon.   ZYRTEC PO Take by mouth.       Diet: routine diet  Activity: Advance as tolerated. Pelvic rest for 6 weeks.   Outpatient follow up:6 weeks Follow up Appt:No future appointments. Follow up Visit:No follow-ups on file.  Postpartum contraception: Undecided  Newborn Data:   Luberta MutterMurph-Brownlee, BoyA Terrie [[id number]][[id number]]  Live born female  Birth Weight: 6 lb 2.4 oz (2790 g) APGAR: 9, 9  Newborn Delivery   Birth date/time:  04/30/2019 16:53:00 Delivery type:  Vaginal, Spontaneous      Felipa EmoryMurph-Brownlee, BoyB Dhruti [[id number]][[id number]]  Live born female  Birth Weight: 5 lb 15.2 oz (2700 g) APGAR: 9, 9  Newborn Delivery   Birth date/time:  04/30/2019 17:08:00 Delivery type:  Vaginal, Spontaneous     Baby Feeding: Bottle Disposition:home with mother   05/02/2019 Oliver PilaKathy W Karthika Glasper, MD

## 2019-05-02 NOTE — Progress Notes (Signed)
Post Partum Day 2 Subjective: no complaints and tolerating PO, rash on hands no worse Wants d/c home today Bottlefeeding  Objective: Blood pressure 120/82, pulse (!) 114, temperature 97.7 F (36.5 C), temperature source Oral, resp. rate 18, weight 94.9 kg, last menstrual period 09/11/2018, SpO2 100 %, unknown if currently breastfeeding.  Physical Exam:  General: alert and cooperative Lochia: appropriate Uterine Fundus: firm   Recent Labs    04/30/19 2119 05/01/19 0543  HGB 11.5* 10.4*  HCT 35.0* 30.8*    Assessment/Plan: Discharge home  Plans circ in office x 2, advised to call today to set up and pay   LOS: 2 days   Oliver Pila 05/02/2019, 9:49 AM

## 2019-05-02 NOTE — MAU Note (Signed)
Pt was discharged earlier today and has been "not feeling right". Has been feeling dizzy and nauseous and feels like she's going to throw up after she eats. Is having back and abdominal pain. Also has a rash that is getting worse since last week.

## 2019-05-02 NOTE — MAU Provider Note (Signed)
S: 38 y.o. F5D3220 who is PPD#2 following preterm SVD of twins at [redacted]w[redacted]d presented with multiple physical complaints including back pain, dizziness, nausea, and rash.  She was discharged today from mother baby and her babies are in the NICU currently.  Her husband is rooming in with the babies at this time because she does not feel able to stay with them.  She reports feeling tired and not feeling well but denies any feelings of sadness or any thoughts of harming herself or others.   O: BP 137/83 (BP Location: Right Arm)   Pulse (!) 105   Temp 98 F (36.7 C) (Oral)   Resp 18   LMP 09/11/2018   SpO2 99%   VS reviewed, nursing note reviewed,  Constitutional: well developed, well nourished, no distress HEENT: normocephalic CV: normal rate Pulm/chest wall: normal effort Abdomen: soft Neuro: alert and oriented x 3 Skin: warm, dry Psych: affect normal  A: 1. Physically well but worried   2. At risk for postpartum depression   3. Chronic bilateral low back pain without sciatica   4. Nausea and vomiting, intractability of vomiting not specified, unspecified vomiting type   5. Episodic lightheadedness   6. Dizziness   7. Rash of hands    P: Pt with concerns for postpartum depression but denies any suicidal risks or other emergencies Encouraged pt to follow up with counseling and given contact information for outpatient behavioral health organizations, including crisis line Pt to f/u with rheumatology, Ob/gyn, and rheumatology on Monday Encouraged pt to go home to rest tonight, and possibly for she and her husband to both stay home tonight and discuss options tomorrow for returning to NICU Pain medication, oxycodone 10 mg given in MAU so pt does not have to pick up prescribed meds tonight D/C home with return precautions  Sharen Counter, CNM 2:31 AM

## 2019-05-02 NOTE — Progress Notes (Signed)
CSW spoke with MOB and FOB at bedside to provide MOB with information to get established with WIC and food stamps. MOB propped up in bed and appeared to have just woken up, when CSW entered the room. FOB present at bedside and holding one of the twins, with the other asleep in basinet. Per MOB and FOB, they were doing good and were ready to go home. Per FOB, they wouldn't be discharged until tomorrow. MOB and FOB, although disappointed, seemed ok with staying. CSW provided MOB with phone number for WIC and DSS to assist in getting WIC and food stamps set up. MOB appeared appreciative.    MOB and FOB appeared appropriate and attentive to infants. CSW identifies no further need for intervention and no barriers to discharge at this time. Please reconsult CSW, if needs arise.  Brittany [name], LCSWA  [facility] [phone]  

## 2019-05-02 NOTE — Discharge Instructions (Signed)
[facility]  [address], [place], [place] [phone]  [facility] [address], [place], [place] [phone] or [phone] [email or web address]  [facility] Mon-Fri, 8:30-5:00 [address], [place], [place] [phone] [email or web address]  *Bring your own interpreter at 1st visit  [facility] [address], [place], [place] [phone] [email or web address]   Psychotherapeutic Services/ACT Services  [address], [place], [place] [phone]  [facility] Mon-Fri, 8am-3pm [address], [place], [place] [phone] [email or web address]  Therapy/Counseling  [facility] [address], [place], [place] [phone]  [facility] [address], [place], [place] [phone]  [facility] [address], [place], [place] [phone] *pacientes que hablen espanol, favor comunicarse con el Sr. [place]restes[place]on, extension 2244 o la [address], extension [place]3331 para hacer una cita.   [facility] [address] [place] [phone] (Habla Espanol)  [facility] [address], [place], [place] [phone]  [facility] [address], [place], [place] [phone]   [facility])  [address], [place], [place] [phone] [email or web address]  *Uninsured and underinsured, ages 19-64  [facility] [address], [place], [place] [phone] (Habla Espanol)  [facility] [address], [place], [place] [phone] (Habla Espanol)  [facility] [address], [place], [place] [phone] Clarice Pole(Habla  Espanol)  [facility] Mon-Thurs 8:30am-8:00pm/ Fri 8:30-7:00pm [address], [place], [place] (3rd floor, located at corner of [address]) Call [phone] to schedule an appointment [email or web address]  [facility] [address], [place], [place]  [phone]  [facility]  [address], [place], [place] [phone]  Social Support [facility]AG ([facility] [place]) [phone] or www.[email or web address] [address], [place], [place] [phone] * Recovery support and educational programs, including recovery skills classes, support groups, and one-on-one sessions with [place] Certified Peer Support Specialists.    [facility](National Alliance of the Mentally Ill) [place] NAMI Helpline: [phone] * Family and Friends Support Group/  Contact Philomena DohenyJack Glenn at [phone] for more information * Family to Beazer HomesFamily Education Program and Basics Class : enroll online or email Darreld McleanMary Pennington at [email or web address]  * Monthly educational meetings, contact Dwain SarnaMitch McGee at [phone] [email or web address]    24- Hour Availability:  Tressie Ellis*[facility] [phone] or [phone]  * [facility] (Domestic Violence, Rape, Victim Assistance) Line [phone]  Vesta Mixer* [facility] [phone] or [phone]  * [facility] [place] [facility]  [phone](8am-4pm [phone]) [phone](after hours)  *[facility] [phone]  *[place] National Suicide Hotline (317)[phone]   Perinatal Depression When a woman feels excessive sadness, anger, or anxiety during pregnancy or during the first 12 months after she gives birth, she has a condition called perinatal depression. Depression can interfere with work, school, relationships, and other everyday activities. If it is not managed properly, it can also cause problems in the mother  and her baby. Sometimes, perinatal depression is left untreated because symptoms are thought to be normal mood swings during and right after pregnancy. If you have symptoms of depression, it is important to talk with your health care provider. What are the causes? The exact cause of this condition is not known. Hormonal changes during and after pregnancy may play a role in causing perinatal depression. What increases the risk? You are more likely  to develop this condition if:  You have a personal or family history of depression, anxiety, or mood disorders.  You experience a stressful life event during pregnancy, such as the death of a loved one.  You have a lot of regular life stress.  You do not have support from family members or loved ones, or you are in an abusive relationship. What are the signs or symptoms? Symptoms of this condition include:  Feeling sad or hopeless.  Feelings of guilt.  Feeling irritable or overwhelmed.  Changes in your appetite.  Lack of energy or motivation.  Sleep problems.  Difficulty concentrating or completing tasks.  Loss of interest in hobbies or relationships.  Headaches or stomach problems that do not go away. How is this diagnosed? This condition is diagnosed based on a physical exam and mental evaluation. In some cases, your health care provider may use a depression screening tool. These tools include a list of questions that can help a health care provider diagnose depression. Your health care provider may refer you to a mental health expert who specializes in depression. How is this treated? This condition may be treated with:  Medicines. Your health care provider will only give you medicines that have been proven safe for pregnancy and breastfeeding.  Talk therapy with a mental health professional to help change your patterns of thinking (cognitive behavioral therapy).  Support groups.  Brain stimulation or light  therapies.  Stress reduction therapies, such as mindfulness. Follow these instructions at home: Lifestyle  Do not use any products that contain nicotine or tobacco, such as cigarettes and e-cigarettes. If you need help quitting, ask your health care provider.  Do not use alcohol when you are pregnant. After your baby is born, limit alcohol intake to no more than 1 drink a day. One drink equals 12 oz of beer, 5 oz of wine, or 1 oz of hard liquor.  Consider joining a support group for new mothers. Ask your health care provider for recommendations.  Take good care of yourself. Make sure you: ? Get plenty of sleep. If you are having trouble sleeping, talk with your health care provider. ? Eat a healthy diet. This includes plenty of fruits and vegetables, whole grains, and lean proteins. ? Exercise regularly, as told by your health care provider. Ask your health care provider what exercises are safe for you. General instructions  Take over-the-counter and prescription medicines only as told by your health care provider.  Talk with your partner or family members about your feelings during pregnancy. Share any concerns or anxieties that you may have.  Ask for help with tasks or chores when you need it. Ask friends and family members to provide meals, watch your children, or help with cleaning.  Keep all follow-up visits as told by your health care provider. This is important. Contact a health care provider if:  You (or people close to you) notice that you have any symptoms of depression.  You have depression and your symptoms get worse.  You experience side effects from medicines, such as nausea or sleep problems. Get help right away if:  You feel like hurting yourself, your baby, or someone else. If you ever feel like you may hurt yourself or others, or have thoughts about taking your own life, get help right away. You can go to your nearest emergency department or call:  Your local  emergency services (911 in the [place]  A suicide crisis helpline, such as the National Suicide Prevention  Lifeline at 253-277-9936. This is open 24 hours a day. Summary  Perinatal depression is when a woman feels excessive sadness, anger, or anxiety during pregnancy or during the first 12 months after she gives birth.  If perinatal depression is not treated, it can lead to health problems for the mother and her baby.  This condition is treated with medicines, talk therapy, stress reduction therapies, or a combination of two or more treatments.  Talk with your partner or family members about your feelings. Do not be afraid to ask for help. This information is not intended to replace advice given to you by your health care provider. Make sure you discuss any questions you have with your health care provider. Document Released: 01/17/2017 Document Revised: 01/17/2017 Document Reviewed: 01/17/2017 Elsevier Interactive Patient Education  2019 [employer].  Postpartum Baby Blues The postpartum period begins right after the birth of a baby. During this time, there is often a lot of joy and excitement. It is also a time of many changes in the life of the parents. No matter how many times a mother gives birth, each child brings new challenges to the family, including different ways of relating to one another. It is common to have feelings of excitement along with confusing changes in moods, emotions, and thoughts. You may feel happy one minute and sad or stressed the next. These feelings of sadness usually happen in the period right after you have your baby, and they go away within a week or two. This is called the "baby blues." What are the causes? There is no known cause of baby blues. It is likely caused by a combination of factors. However, changes in hormone levels after childbirth are believed to trigger some of the symptoms. Other factors that can play a role in these mood changes  include:  Lack of sleep.  Stressful life events, such as poverty, caring for a loved one, or death of a loved one.  Genetics. What are the signs or symptoms? Symptoms of this condition include:  Brief changes in mood, such as going from extreme happiness to sadness.  Decreased concentration.  Difficulty sleeping.  Crying spells and tearfulness.  Loss of appetite.  Irritability.  Anxiety. If the symptoms of baby blues last for more than 2 weeks or become more severe, you may have postpartum depression. How is this diagnosed? This condition is diagnosed based on an evaluation of your symptoms. There are no medical or lab tests that lead to a diagnosis, but there are various questionnaires that a health care provider may use to identify women with the baby blues or postpartum depression. How is this treated? Treatment is not needed for this condition. The baby blues usually go away on their own in 1-2 weeks. Social support is often all that is needed. You will be encouraged to get adequate sleep and rest. Follow these instructions at home: Lifestyle      Get as much rest as you can. Take a nap when the baby sleeps.  Exercise regularly as told by your health care provider. Some women find yoga and walking to be helpful.  Eat a balanced and nourishing diet. This includes plenty of fruits and vegetables, whole grains, and lean proteins.  Do little things that you enjoy. Have a cup of tea, take a bubble bath, read your favorite magazine, or listen to your favorite music.  Avoid alcohol.  Ask for help with household chores, cooking, grocery shopping, or running errands. Do not try  to do everything yourself. Consider hiring a postpartum doula to help. This is a professional who specializes in providing support to new mothers.  Try not to make any major life changes during pregnancy or right after giving birth. This can add stress. General instructions  Talk to people close to  you about how you are feeling. Get support from your partner, family members, friends, or other new moms. You may want to join a support group.  Find ways to cope with stress. This may include: ? Writing your thoughts and feelings in a journal. ? Spending time outside. ? Spending time with people who make you laugh.  Try to stay positive in how you think. Think about the things you are grateful for.  Take over-the-counter and prescription medicines only as told by your health care provider.  Let your health care provider know if you have any concerns.  Keep all postpartum visits as told by your health care provider. This is important. Contact a health care provider if:  Your baby blues do not go away after 2 weeks. Get help right away if:  You have thoughts of taking your own life (suicidal thoughts).  You think you may harm the baby or other people.  You see or hear things that are not there (hallucinations). Summary  After giving birth, you may feel happy one minute and sad or stressed the next. Feelings of sadness that happen right after the baby is born and go away after a week or two are called the "baby blues."  You can manage the baby blues by getting enough rest, eating a healthy diet, exercising, spending time with supportive people, and finding ways to cope with stress.  If feelings of sadness and stress last longer than 2 weeks or get in the way of caring for your baby, talk to your health care provider. This may mean you have postpartum depression. This information is not intended to replace advice given to you by your health care provider. Make sure you discuss any questions you have with your health care provider. Document Released: 08/24/2004 Document Revised: 01/16/2017 Document Reviewed: 01/16/2017 Elsevier Interactive Patient Education  2019 Elsevier Inc.  Preeclampsia and Eclampsia  Preeclampsia is a serious condition that may develop during pregnancy. It is  also called toxemia of pregnancy. This condition causes high blood pressure along with other symptoms, such as swelling and headaches. These symptoms may develop as the condition gets worse. Preeclampsia may occur at 20 weeks of pregnancy or later. Diagnosing and treating preeclampsia early is very important. If not treated early, it can cause serious problems for you and your baby. One problem it can lead to is eclampsia. Eclampsia is a condition that causes muscle jerking or shaking (convulsions or seizures) and other serious problems for the mother. During pregnancy, delivering your baby may be the best treatment for preeclampsia or eclampsia. For most women, preeclampsia and eclampsia symptoms go away after giving birth. In rare cases, a woman may develop preeclampsia after giving birth (postpartum preeclampsia). This usually occurs within 48 hours after childbirth but may occur up to 6 weeks after giving birth. What are the causes? The cause of preeclampsia is not known. What increases the risk? The following risk factors make you more likely to develop preeclampsia:  Being pregnant for the first time.  Having had preeclampsia during a past pregnancy.  Having a family history of preeclampsia.  Having high blood pressure.  Being pregnant with more than one baby.  Being  35 or older.  Being African-American.  Having kidney disease or diabetes.  Having medical conditions such as lupus or blood diseases.  Being very overweight (obese). What are the signs or symptoms? The earliest signs of preeclampsia are:  High blood pressure.  Increased protein in your urine. Your health care provider will check for this at every visit before you give birth (prenatal visit). Other symptoms that may develop as the condition gets worse include:  Severe headaches.  Sudden weight gain.  Swelling of the hands, face, legs, and feet.  Nausea and vomiting.  Vision problems, such as blurred or  double vision.  Numbness in the face, arms, legs, and feet.  Urinating less than usual.  Dizziness.  Slurred speech.  Abdominal pain, especially upper abdominal pain.  Convulsions or seizures. How is this diagnosed? There are no screening tests for preeclampsia. Your health care provider will ask you about symptoms and check for signs of preeclampsia during your prenatal visits. You may also have tests that include:  Urine tests.  Blood tests.  Checking your blood pressure.  Monitoring your babys heart rate.  Ultrasound. How is this treated? You and your health care provider will determine the treatment approach that is best for you. Treatment may include:  Having more frequent prenatal exams to check for signs of preeclampsia, if you have an increased risk for preeclampsia.  Medicine to lower your blood pressure.  Staying in the hospital, if your condition is severe. There, treatment will focus on controlling your blood pressure and the amount of fluids in your body (fluid retention).  Taking medicine (magnesium sulfate) to prevent seizures. This may be given as an injection or through an IV.  Taking a low-dose aspirin during your pregnancy.  Delivering your baby early, if your condition gets worse. You may have your labor started with medicine (induced), or you may have a cesarean delivery. Follow these instructions at home: Eating and drinking   Drink enough fluid to keep your urine pale yellow.  Avoid caffeine. Lifestyle  Do not use any products that contain nicotine or tobacco, such as cigarettes and e-cigarettes. If you need help quitting, ask your health care provider.  Do not use alcohol or drugs.  Avoid stress as much as possible. Rest and get plenty of sleep. General instructions  Take over-the-counter and prescription medicines only as told by your health care provider.  When lying down, lie on your left side. This keeps pressure off your major  blood vessels.  When sitting or lying down, raise (elevate) your feet. Try putting some pillows underneath your lower legs.  Exercise regularly. Ask your health care provider what kinds of exercise are best for you.  Keep all follow-up and prenatal visits as told by your health care provider. This is important. How is this prevented? There is no known way of preventing preeclampsia or eclampsia from developing. However, to lower your risk of complications and detect problems early:  Get regular prenatal care. Your health care provider may be able to diagnose and treat the condition early.  Maintain a healthy weight. Ask your health care provider for help managing weight gain during pregnancy.  Work with your health care provider to manage any long-term (chronic) health conditions you have, such as diabetes or kidney problems.  You may have tests of your blood pressure and kidney function after giving birth.  Your health care provider may have you take low-dose aspirin during your next pregnancy. Contact a health care provider if:  You have symptoms that your health care provider told you may require more treatment or monitoring, such as: ? Headaches. ? Nausea or vomiting. ? Abdominal pain. ? Dizziness. ? Light-headedness. Get help right away if:  You have severe: ? Abdominal pain. ? Headaches that do not get better. ? Dizziness. ? Vision problems. ? Confusion. ? Nausea or vomiting.  You have any of the following: ? A seizure. ? Sudden, rapid weight gain. ? Sudden swelling in your hands, ankles, or face. ? Trouble moving any part of your body. ? Numbness in any part of your body. ? Trouble speaking. ? Abnormal bleeding.  You faint. Summary  Preeclampsia is a serious condition that may develop during pregnancy. It is also called toxemia of pregnancy.  This condition causes high blood pressure along with other symptoms, such as swelling and headaches.  Diagnosing and  treating preeclampsia early is very important. If not treated early, it can cause serious problems for you and your baby.  Get help right away if you have symptoms that your health care provider told you to watch for. This information is not intended to replace advice given to you by your health care provider. Make sure you discuss any questions you have with your health care provider. Document Released: 11/17/2000 Document Revised: 11/06/2017 Document Reviewed: 06/26/2016 Elsevier Interactive Patient Education  2019 [employer].

## 2019-05-02 NOTE — MAU Provider Note (Signed)
History     CSN: [id number]  Arrival date and time: 05/02/19 8938   First Provider Initiated Contact with Patient 05/02/19 1910      Chief Complaint  Patient presents with  . Back Pain  . Abdominal Pain  . Nausea  . Dizziness   Ms. Brittany [name] is a 38 y.o. [id number] at postpartum who presents to MAU for uterine cramping. Pt delivered twins vaginally on 04/30/2019 without complications. Pt also reports back pain at site of epidural, but also along her shoulder blades, along with lower back pain. Pt also reports feeling lightheaded and dizzy "to the point where I feel like I have to throw up." Pt reports she has eaten a couple bites of cereal, some graham crackers/water/vitamin water/apple juice which stayed down, ginger ale/orange juice/spaghetti which she threw up. Pt reports experiencing nausea at this time. Pt reports VB, needing to change pads q3hrs and they are not saturated. Pt also reports heartburn. Pt also reports small bumps on her hands x1wk - pt had appt to see a doctor this past Wednesday, but started bleeding and had to be delivered. Pt has been using a cream and taking Clairtin per her OB. Pt reports the bumps on her hands have not gotten worse since they appeared a week ago. Pt was discharged home with Percocet and ibuprofen, but she has not been home to pick up the medication.  Pt states she was not feeling well when she was discharged, and thought she was getting discharged tomorrow and was surprised to learn that she was being discharged this morning.  Pt denies abdominal pain, constipation, diarrhea, or urinary problems. Pt denies fever, chills, fatigue, sweating or changes in appetite. Pt denies SOB or chest pain. Pt denies HA, weakness.  Problems this pregnancy include: twin gestation, SLE, "blood pressure issues". Allergies? Latex, strawberry Current medications/supplements? Plaquenil, Sudafed, Xylitol for allergies, Fe   OB History    Gravida  5   Para  2   Term  1   Preterm  1   AB  3   Living  3     SAB  2   TAB  1   Ectopic      Multiple  1   Live Births  2           Past Medical History:  Diagnosis Date  . Anemia   . Lupus (HCC)   . Sarcoidosis   . Urticaria     Past Surgical History:  Procedure Laterality Date  . BIOPSY THYROID    . DILATION AND CURETTAGE OF UTERUS    . DILATION AND CURETTAGE, DIAGNOSTIC / THERAPEUTIC  01/04/2018    Family History  Problem Relation Age of Onset  . Hypertension Mother   . Anemia Mother   . Food Allergy Mother        red dye, tomato, strawberry  . Allergic rhinitis Father   . Prostate cancer Father   . Heart disease Father   . Allergy (severe) Father        insect bee stings  . Allergic rhinitis Brother   . Hypertension Brother   . Heart disease Paternal Grandfather     Social History   Tobacco Use  . Smoking status: Never Smoker  . Smokeless tobacco: Never Used  Substance Use Topics  . Alcohol use: Not Currently    Alcohol/week: 1.0 standard drinks    Types: 1 Glasses of wine per week    Comment: social drinker  .  Drug use: Never    Allergies:  Allergies  Allergen Reactions  . Latex Hives  . Strawberry (Diagnostic) Rash    Medications Prior to Admission  Medication Sig Dispense Refill Last Dose  . acetaminophen (TYLENOL) 325 MG tablet Take 2 tablets (650 mg total) by mouth every 4 (four) hours as needed (for pain scale < 4). 30 tablet 0   . Cetirizine HCl (ZYRTEC PO) Take by mouth.   Taking  . hydroxychloroquine (PLAQUENIL) 200 MG tablet Take by mouth.   Taking  . ibuprofen (ADVIL) 600 MG tablet Take 1 tablet (600 mg total) by mouth every 6 (six) hours. 30 tablet 0   . montelukast (SINGULAIR) 10 MG tablet TAKE 1 TABLET BY MOUTH AT BEDTIME 90 tablet 0   . oxyCODONE (OXY IR/ROXICODONE) 5 MG immediate release tablet Take 1 tablet (5 mg total) by mouth every 4 (four) hours as needed (pain scale 4-7). 15 tablet 0   . predniSONE (DELTASONE) 5  MG tablet 5 mg daily as needed.    Taking  . Prenatal Vit-Fe Fumarate-FA (PRENATAL MULTIVITAMIN) TABS tablet Take 1 tablet by mouth daily at 12 noon.       Review of Systems  Constitutional: Positive for fatigue. Negative for chills, diaphoresis and fever.  Respiratory: Negative for shortness of breath.   Cardiovascular: Negative for chest pain.  Gastrointestinal: Positive for nausea and vomiting. Negative for abdominal pain, constipation and diarrhea.  Genitourinary: Positive for vaginal bleeding. Negative for dysuria, flank pain, frequency, pelvic pain, urgency and vaginal discharge.  Musculoskeletal: Positive for back pain.  Neurological: Positive for dizziness and light-headedness. Negative for weakness and headaches.  Psychiatric/Behavioral: Positive for dysphoric mood.   Physical Exam   Blood pressure 137/83, pulse (!) 105, temperature 98 F (36.7 C), temperature source Oral, resp. rate 18, last menstrual period 09/11/2018, SpO2 99 %, unknown if currently breastfeeding.  Patient Vitals for the past 24 hrs:  BP Temp Temp src Pulse Resp SpO2  05/02/19 1905 - - - - - 99 %  05/02/19 1900 - - - - - 100 %  05/02/19 1855 - - - - - 100 %  05/02/19 1850 - - - - - 100 %  05/02/19 1845 - - - - - 100 %  05/02/19 1840 - - - - - 100 %  05/02/19 1839 137/83 98 F (36.7 C) Oral (!) 105 18 -  05/02/19 1838 - - - - - 100 %    Physical Exam  Constitutional: She is oriented to person, place, and time. She appears well-developed and well-nourished. No distress.  HENT:  Head: Normocephalic and atraumatic.  Respiratory: Effort normal.  GI: Soft. Bowel sounds are normal. She exhibits no distension and no mass. There is no abdominal tenderness. There is no rebound and no guarding.  Redness around umbilicus, pt reports she has been scratching in that area.  Neurological: She is alert and oriented to person, place, and time.  Skin: Skin is warm and dry. She is not diaphoretic.  -back normal in  appearance, no lesions, masses or redness, no pain on palpation  Psychiatric: She has a normal mood and affect. Her behavior is normal. Judgment and thought content normal.   Results for orders placed or performed during the hospital encounter of 05/02/19 (from the past 24 hour(s))  CBC with Differential/Platelet     Status: Abnormal   Collection Time: 05/02/19  8:05 PM  Result Value Ref Range   WBC 7.0 4.0 - 10.5 K/uL  RBC 3.33 (L) 3.87 - 5.11 MIL/uL   Hemoglobin 10.0 (L) 12.0 - 15.0 g/dL   HCT 16.1 (L) 09.6 - 04.5 %   MCV 92.2 80.0 - 100.0 fL   MCH 30.0 26.0 - 34.0 pg   MCHC 32.6 30.0 - 36.0 g/dL   RDW 40.9 (H) 81.1 - 91.4 %   Platelets 151 150 - 400 K/uL   nRBC 0.4 (H) 0.0 - 0.2 %   Neutrophils Relative % 71 %   Neutro Abs 5.0 1.7 - 7.7 K/uL   Lymphocytes Relative 14 %   Lymphs Abs 1.0 0.7 - 4.0 K/uL   Monocytes Relative 10 %   Monocytes Absolute 0.7 0.1 - 1.0 K/uL   Eosinophils Relative 1 %   Eosinophils Absolute 0.1 0.0 - 0.5 K/uL   Basophils Relative 0 %   Basophils Absolute 0.0 0.0 - 0.1 K/uL   Immature Granulocytes 4 %   Abs Immature Granulocytes 0.25 (H) 0.00 - 0.07 K/uL  Comprehensive metabolic panel     Status: Abnormal   Collection Time: 05/02/19  8:05 PM  Result Value Ref Range   Sodium 131 (L) 135 - 145 mmol/L   Potassium 3.9 3.5 - 5.1 mmol/L   Chloride 100 98 - 111 mmol/L   CO2 22 22 - 32 mmol/L   Glucose, Bld 85 70 - 99 mg/dL   BUN 9 6 - 20 mg/dL   Creatinine, Ser 7.82 0.44 - 1.00 mg/dL   Calcium 9.0 8.9 - 95.6 mg/dL   Total Protein 5.9 (L) 6.5 - 8.1 g/dL   Albumin 1.6 (L) 3.5 - 5.0 g/dL   AST 30 15 - 41 U/L   ALT 19 0 - 44 U/L   Alkaline Phosphatase 179 (H) 38 - 126 U/L   Total Bilirubin 0.7 0.3 - 1.2 mg/dL   GFR calc non Af Amer >60 >60 mL/min   GFR calc Af Amer >60 >60 mL/min   Anion gap 9 5 - 15   No results found.  MAU Course  Procedures  MDM -pt presents with multiple c/o of uterine cramping, generalized back pain, lightheadedness,  dizziness, nausea, vomiting, heartburn, bumps on her hands -rash on hands not an obsteric emergency, pt to f/u with OB/other provider -Dr. Senaida Ores called  re: pt, states that pt was discharged at request of the patient, she has a plan for pt regarding the bumps on her hand and is OK with lab work being performed and pt being discharged home if all is normal. -CBC w/ Diff: H/H 10/30 (was 10.4/30.8 yesterday), WBCs WNL -CMP: Na 131, alkaline phosphatase elevated -Pepcid  given, pt reports nausea has mildly improved -Zofran not given after discussion with pharmacy regarding concerns of QT prolongation on Plaquenil -called and spoke with Dr. Earlene Plater @ 2200, worked through differentials, per Dr. Ignacia Palma, confirm that she does not have SI/HI, if not, OK to be discharged home -pt denies SI/HI, labs OK, mild hypertension, pt advised to call office Monday, preeclampsia precautions given -pt discharged to home in stable condition  Orders Placed This Encounter  Procedures  . CBC with Differential/Platelet    Standing Status:   Standing    Number of Occurrences:   1  . Comprehensive metabolic panel    Standing Status:   Standing    Number of Occurrences:   1   Meds ordered this encounter  Medications  . DISCONTD: ondansetron (ZOFRAN-ODT) disintegrating tablet 8 mg  . famotidine (PEPCID) tablet 20 mg   Assessment and Plan  1. Physically well but worried   2. At risk for postpartum depression   3. Chronic bilateral low back pain without sciatica   4. Nausea and vomiting, intractability of vomiting not specified, unspecified vomiting type   5. Episodic lightheadedness   6. Dizziness   7. Rash of hands    Allergies as of 05/02/2019      Reactions   Latex Hives   Strawberry (diagnostic) Rash      Medication List    TAKE these medications   acetaminophen 325 MG tablet Commonly known as:  Tylenol Take 2 tablets (650 mg total) by mouth every 4 (four) hours as needed (for pain  scale < 4).   hydroxychloroquine 200 MG tablet Commonly known as:  PLAQUENIL Take by mouth.   ibuprofen 600 MG tablet Commonly known as:  ADVIL Take 1 tablet (600 mg total) by mouth every 6 (six) hours.   montelukast 10 MG tablet Commonly known as:  SINGULAIR TAKE 1 TABLET BY MOUTH AT BEDTIME   oxyCODONE 5 MG immediate release tablet Commonly known as:  Oxy IR/ROXICODONE Take 1 tablet (5 mg total) by mouth every 4 (four) hours as needed (pain scale 4-7).   predniSONE 5 MG tablet Commonly known as:  DELTASONE 5 mg daily as needed.   prenatal multivitamin Tabs tablet Take 1 tablet by mouth daily at 12 noon.   ZYRTEC PO Take by mouth.      -discussed uterine involution, cramping related to breastfeeding, and other normal expectations postpartum -pt advised to go home for good night sleep and let husband room-in with babies - discussed impt of sleep and self-care in postpartum period -discussed s/sx of baby blues vs. postpartum depression/SI/HI/strict MAU return/911 precautions given -pt discharged to home in stable condition  Joni Reiningicole E Nugent 05/02/2019, 9:18 PM

## 2019-06-12 DIAGNOSIS — Z3202 Encounter for pregnancy test, result negative: Secondary | ICD-10-CM | POA: Diagnosis not present

## 2019-06-12 DIAGNOSIS — Z3009 Encounter for other general counseling and advice on contraception: Secondary | ICD-10-CM | POA: Diagnosis not present

## 2019-06-12 DIAGNOSIS — Z1389 Encounter for screening for other disorder: Secondary | ICD-10-CM | POA: Diagnosis not present

## 2019-07-02 DIAGNOSIS — Z3046 Encounter for surveillance of implantable subdermal contraceptive: Secondary | ICD-10-CM | POA: Diagnosis not present

## 2019-07-31 DIAGNOSIS — Z79899 Other long term (current) drug therapy: Secondary | ICD-10-CM | POA: Diagnosis not present

## 2019-07-31 DIAGNOSIS — M329 Systemic lupus erythematosus, unspecified: Secondary | ICD-10-CM | POA: Diagnosis not present

## 2019-07-31 DIAGNOSIS — D709 Neutropenia, unspecified: Secondary | ICD-10-CM | POA: Diagnosis not present

## 2019-08-28 IMAGING — US US OB EACH ADDL GEST<[ID]
1 series · 14 of 28 positions shown · non-contrast
Comparison: 10/18/2018.

CLINICAL DATA: Vaginal bleeding. Previously demonstrated
dichorionic diamniotic twin gestation. Nine weeks and 2 days
pregnant by previous ultrasound.

EXAM:
TWIN OBSTETRIC <14WK US AND TRANSVAGINAL OB US

[Series 1: us ob each addl gest<(id) · 0.24mm/px · 113 acquisitions, 14 frames shown]
[im 5/113]
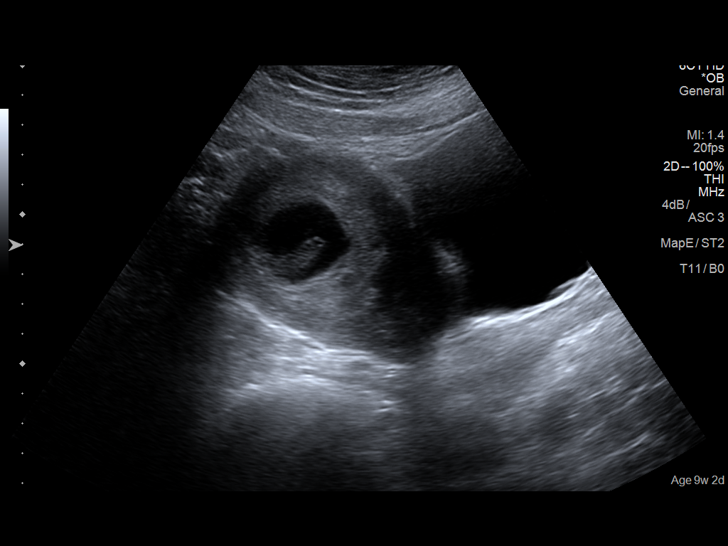
[im 13/113]
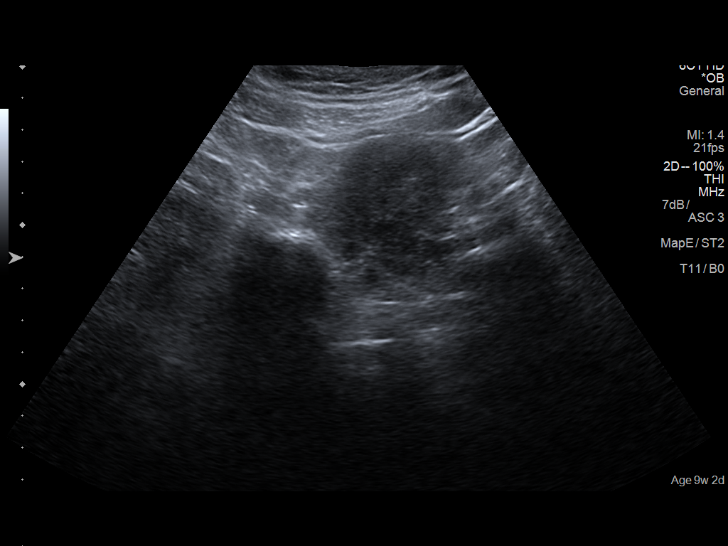
[im 21/113]
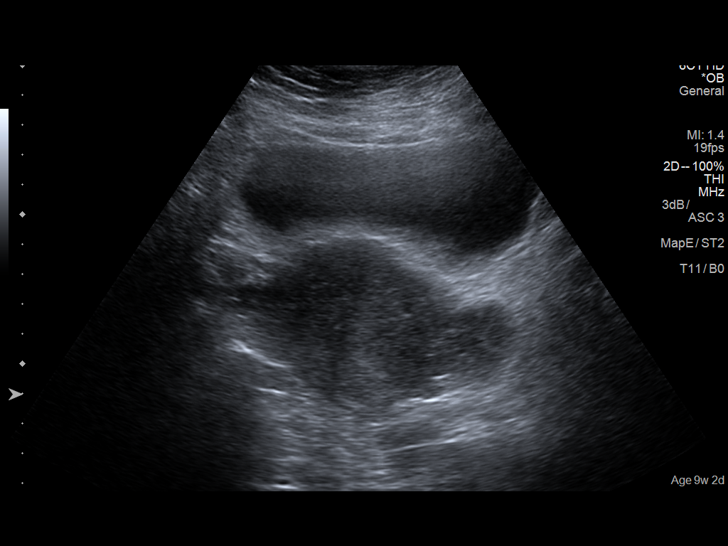
[im 30/113]
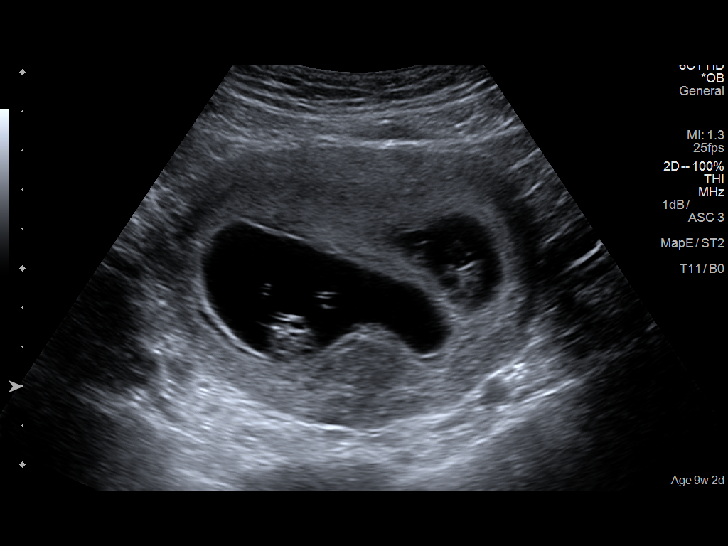
[im 38/113]
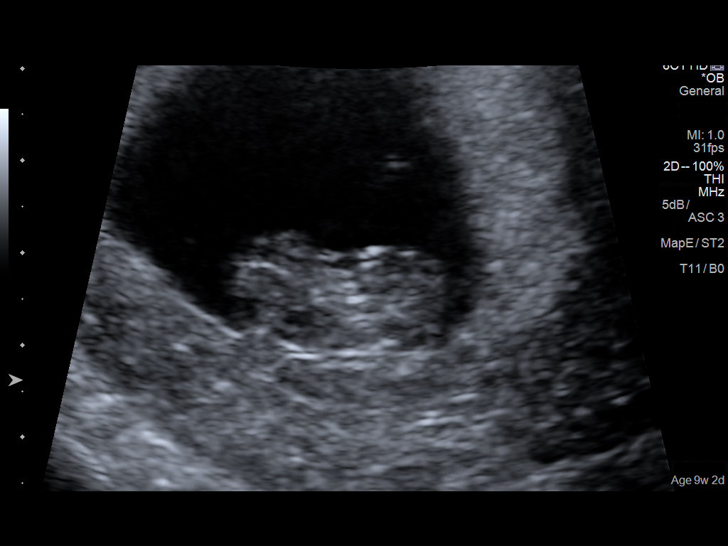
[im 46/113]
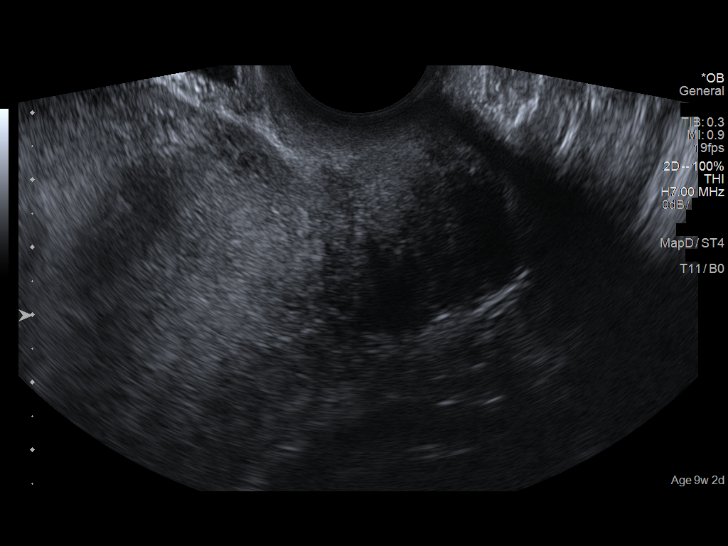
[im 54/113]
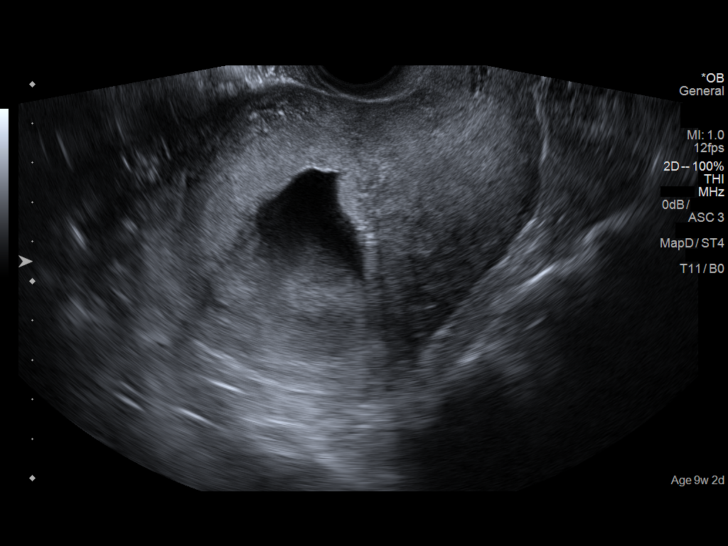
[im 63/113]
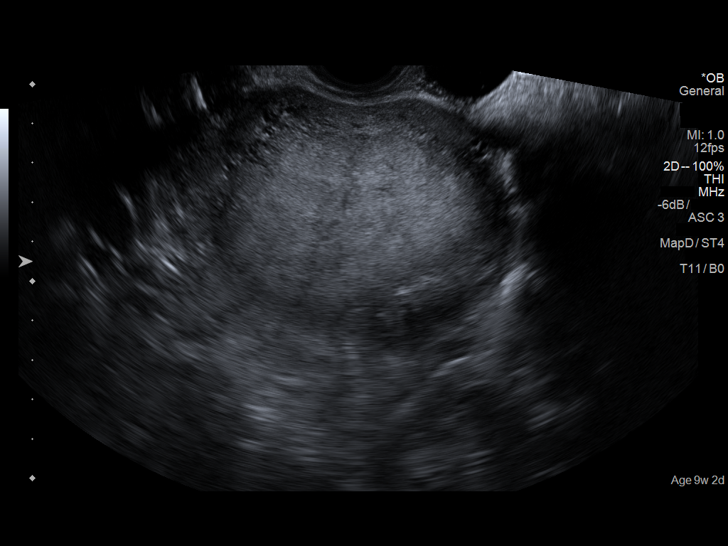
[im 71/113]
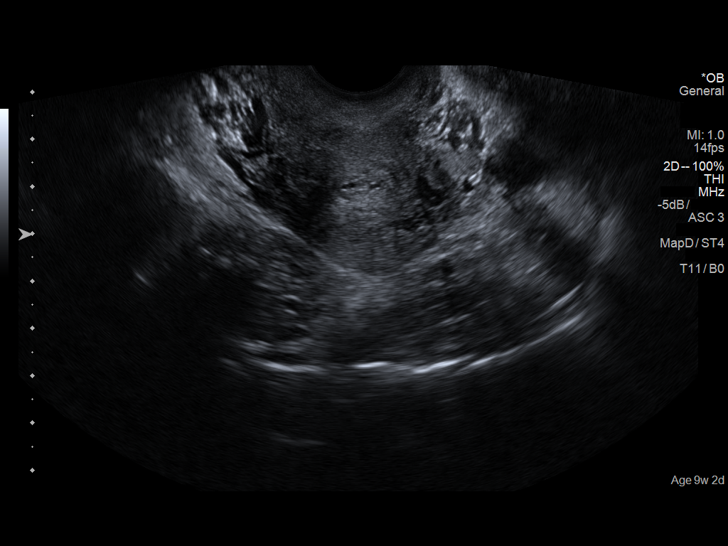
[im 79/113]
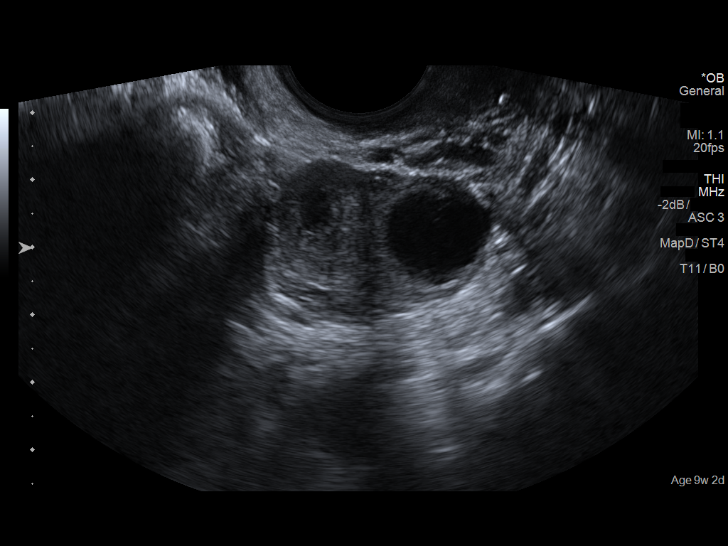
[im 88/113]
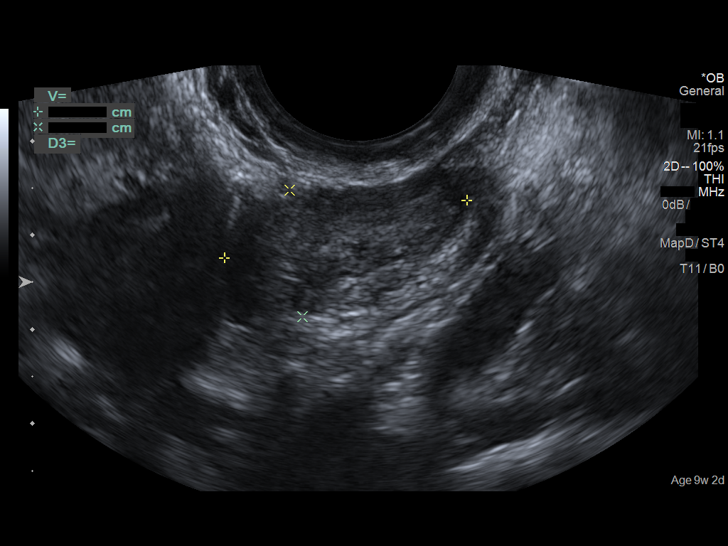
[im 96/113]
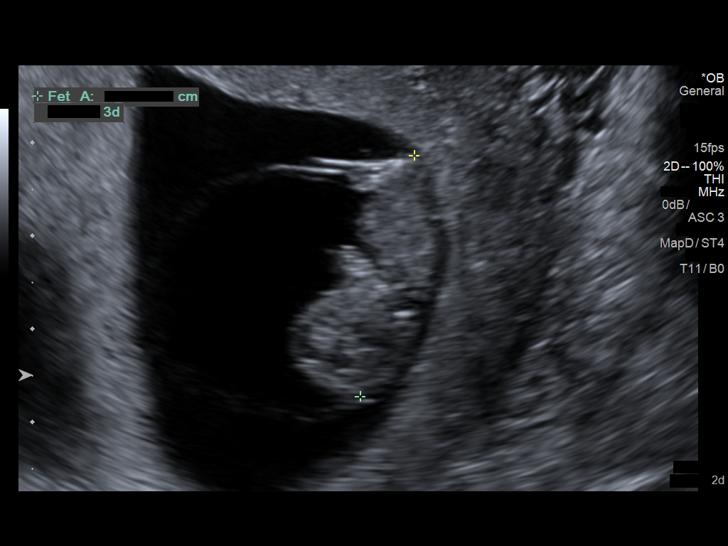
[im 104/113]
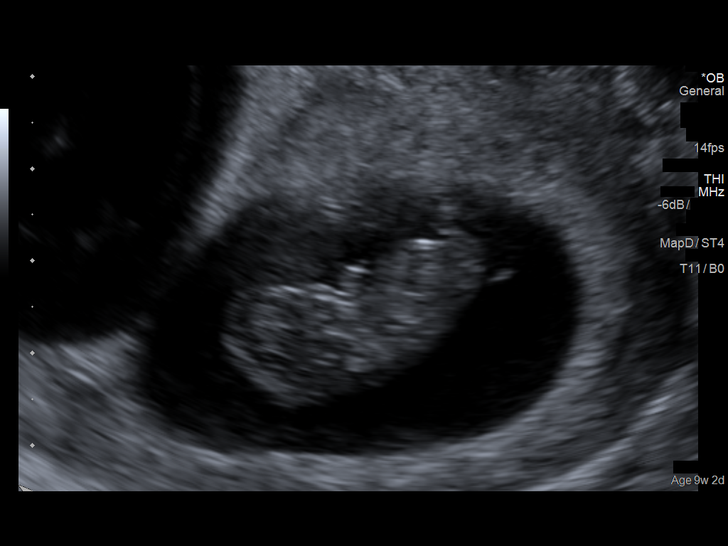
[im 113/113]
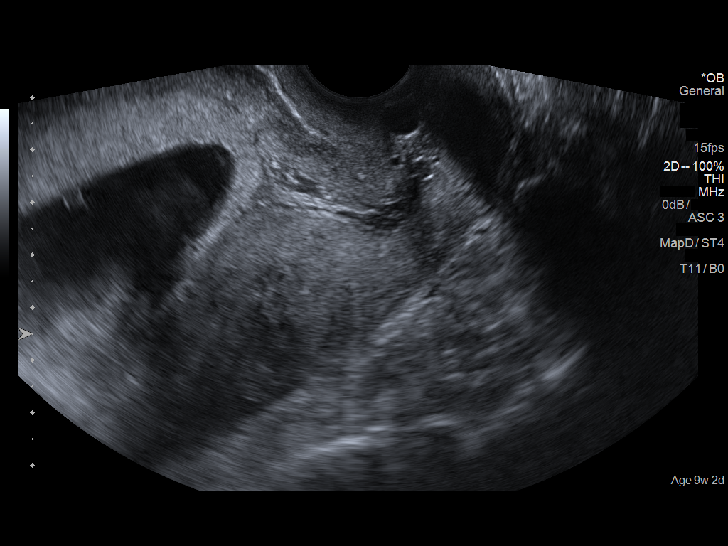

[14 of 28 positions shown; findings below may reference images not displayed]

FINDINGS: Number of IUPs:  2

Chorionicity/Amnionicity:  Dichorionic diamniotic

TWIN 1

Yolk sac:  Visualized

Embryo:  Visualized

Cardiac Activity: Visualized

Heart Rate: 178 bpm

CRL:  27.2 mm   9 w 4 d                  US EDC: 05/26/2019.

TWIN 2

Yolk sac:  Visualized

Embryo:  Visualized

Cardiac Activity: Visualized

Heart Rate: 171 bpm

CRL:  29.2 mm   9 w 5 d                  US EDC: 05/06/2019.

Subchorionic hemorrhage:  Small

Maternal uterus/adnexae: 2.8 cm oval mass in the posterior uterus on
the left. Normal appearing maternal right ovary. Two left ovarian
corpus luteums.
IMPRESSION: 1. Previously demonstrated dichorionic diamniotic twin gestation
with an estimated gestational ages of 9 weeks and 4 days and 9 weeks
and 5 days.
2. Small subchorionic hemorrhage.
3. 2.8 cm uterine fibroid.

## 2019-09-25 DIAGNOSIS — R809 Proteinuria, unspecified: Secondary | ICD-10-CM | POA: Diagnosis not present

## 2019-09-25 DIAGNOSIS — D709 Neutropenia, unspecified: Secondary | ICD-10-CM | POA: Diagnosis not present

## 2019-09-25 DIAGNOSIS — M329 Systemic lupus erythematosus, unspecified: Secondary | ICD-10-CM | POA: Diagnosis not present

## 2019-09-25 DIAGNOSIS — D509 Iron deficiency anemia, unspecified: Secondary | ICD-10-CM | POA: Diagnosis not present

## 2019-11-10 DIAGNOSIS — D709 Neutropenia, unspecified: Secondary | ICD-10-CM | POA: Diagnosis not present

## 2019-11-10 DIAGNOSIS — D509 Iron deficiency anemia, unspecified: Secondary | ICD-10-CM | POA: Diagnosis not present

## 2019-11-10 DIAGNOSIS — E538 Deficiency of other specified B group vitamins: Secondary | ICD-10-CM | POA: Diagnosis not present

## 2019-11-10 DIAGNOSIS — D72819 Decreased white blood cell count, unspecified: Secondary | ICD-10-CM | POA: Diagnosis not present

## 2019-11-13 DIAGNOSIS — H35441 Age-related reticular degeneration of retina, right eye: Secondary | ICD-10-CM | POA: Diagnosis not present

## 2019-11-13 DIAGNOSIS — H52203 Unspecified astigmatism, bilateral: Secondary | ICD-10-CM | POA: Diagnosis not present

## 2019-11-13 DIAGNOSIS — Z79899 Other long term (current) drug therapy: Secondary | ICD-10-CM | POA: Diagnosis not present

## 2019-11-13 DIAGNOSIS — M328 Other forms of systemic lupus erythematosus: Secondary | ICD-10-CM | POA: Diagnosis not present

## 2019-11-13 DIAGNOSIS — H5213 Myopia, bilateral: Secondary | ICD-10-CM | POA: Diagnosis not present

## 2019-11-18 DIAGNOSIS — D509 Iron deficiency anemia, unspecified: Secondary | ICD-10-CM | POA: Diagnosis not present

## 2019-12-31 DIAGNOSIS — D509 Iron deficiency anemia, unspecified: Secondary | ICD-10-CM | POA: Diagnosis not present

## 2020-01-30 DIAGNOSIS — M329 Systemic lupus erythematosus, unspecified: Secondary | ICD-10-CM | POA: Diagnosis not present

## 2020-01-30 DIAGNOSIS — Z79899 Other long term (current) drug therapy: Secondary | ICD-10-CM | POA: Diagnosis not present

## 2020-03-26 DIAGNOSIS — D509 Iron deficiency anemia, unspecified: Secondary | ICD-10-CM | POA: Diagnosis not present

## 2020-03-26 DIAGNOSIS — R809 Proteinuria, unspecified: Secondary | ICD-10-CM | POA: Diagnosis not present

## 2020-03-26 DIAGNOSIS — R3129 Other microscopic hematuria: Secondary | ICD-10-CM | POA: Diagnosis not present

## 2020-03-26 DIAGNOSIS — M329 Systemic lupus erythematosus, unspecified: Secondary | ICD-10-CM | POA: Diagnosis not present

## 2020-05-10 DIAGNOSIS — D509 Iron deficiency anemia, unspecified: Secondary | ICD-10-CM | POA: Diagnosis not present

## 2020-05-10 DIAGNOSIS — D72810 Lymphocytopenia: Secondary | ICD-10-CM | POA: Diagnosis not present

## 2020-05-10 DIAGNOSIS — D709 Neutropenia, unspecified: Secondary | ICD-10-CM | POA: Diagnosis not present

## 2020-05-10 DIAGNOSIS — E538 Deficiency of other specified B group vitamins: Secondary | ICD-10-CM | POA: Diagnosis not present

## 2020-05-12 DIAGNOSIS — E538 Deficiency of other specified B group vitamins: Secondary | ICD-10-CM | POA: Diagnosis not present

## 2020-06-02 DIAGNOSIS — L089 Local infection of the skin and subcutaneous tissue, unspecified: Secondary | ICD-10-CM | POA: Diagnosis not present

## 2020-06-02 DIAGNOSIS — H6192 Disorder of left external ear, unspecified: Secondary | ICD-10-CM | POA: Diagnosis not present

## 2020-06-02 DIAGNOSIS — L930 Discoid lupus erythematosus: Secondary | ICD-10-CM | POA: Diagnosis not present

## 2020-07-07 DIAGNOSIS — E538 Deficiency of other specified B group vitamins: Secondary | ICD-10-CM | POA: Diagnosis not present

## 2020-07-07 DIAGNOSIS — D509 Iron deficiency anemia, unspecified: Secondary | ICD-10-CM | POA: Diagnosis not present

## 2020-07-09 ENCOUNTER — Encounter: Payer: Self-pay | Admitting: Family Medicine

## 2020-07-09 ENCOUNTER — Other Ambulatory Visit: Payer: Self-pay

## 2020-07-09 ENCOUNTER — Ambulatory Visit (INDEPENDENT_AMBULATORY_CARE_PROVIDER_SITE_OTHER): Payer: BC Managed Care – PPO | Admitting: Family Medicine

## 2020-07-09 VITALS — BP 99/68 | HR 88 | Ht 68.0 in | Wt 210.0 lb

## 2020-07-09 DIAGNOSIS — Z3046 Encounter for surveillance of implantable subdermal contraceptive: Secondary | ICD-10-CM

## 2020-07-09 NOTE — Progress Notes (Signed)
Here for nexplanon removal. Had twins about 14 months ago and had nexplanon placed shortly afterwards. Would like to be pregnant again.  Nexplanon Removal:  Patient given informed consent for removal of her Implanon, time out was performed.  Signed copy in the chart.  Appropriate time out taken. Implanon site identified.  Area prepped in usual sterile fashon. 25mL of 1% lidocaine was used to anesthetize the area at the distal end of the implant. A small stab incision was made right beside the implant on the distal portion.  The implanon rod was grasped using hemostats and removed without difficulty.  There was less than 3 cc blood loss. There were no complications.  A small amount of antibiotic ointment and steri-strips were applied over the small incision.  A pressure bandage was applied to reduce any bruising.  The patient tolerated the procedure well and was given post procedure instructions.

## 2020-07-30 DIAGNOSIS — Z79899 Other long term (current) drug therapy: Secondary | ICD-10-CM | POA: Diagnosis not present

## 2020-07-30 DIAGNOSIS — D709 Neutropenia, unspecified: Secondary | ICD-10-CM | POA: Diagnosis not present

## 2020-07-30 DIAGNOSIS — M329 Systemic lupus erythematosus, unspecified: Secondary | ICD-10-CM | POA: Diagnosis not present

## 2020-08-05 ENCOUNTER — Encounter: Payer: Self-pay | Admitting: Family Medicine

## 2020-08-05 ENCOUNTER — Ambulatory Visit (INDEPENDENT_AMBULATORY_CARE_PROVIDER_SITE_OTHER): Payer: BC Managed Care – PPO | Admitting: Family Medicine

## 2020-08-05 ENCOUNTER — Other Ambulatory Visit: Payer: Self-pay

## 2020-08-05 ENCOUNTER — Other Ambulatory Visit (HOSPITAL_COMMUNITY)
Admission: RE | Admit: 2020-08-05 | Discharge: 2020-08-05 | Disposition: A | Discharge status: Home / Self Care | Payer: BC Managed Care – PPO | Source: Ambulatory Visit | Attending: Family Medicine | Admitting: Family Medicine

## 2020-08-05 VITALS — BP 101/62 | HR 82 | Ht 68.0 in | Wt 213.0 lb

## 2020-08-05 DIAGNOSIS — M3219 Other organ or system involvement in systemic lupus erythematosus: Secondary | ICD-10-CM | POA: Diagnosis not present

## 2020-08-05 DIAGNOSIS — Z01419 Encounter for gynecological examination (general) (routine) without abnormal findings: Secondary | ICD-10-CM

## 2020-08-05 DIAGNOSIS — D869 Sarcoidosis, unspecified: Secondary | ICD-10-CM

## 2020-08-05 DIAGNOSIS — L732 Hidradenitis suppurativa: Secondary | ICD-10-CM | POA: Diagnosis not present

## 2020-08-05 MED ORDER — CLINDAMYCIN PHOSPHATE 1 % EX GEL: Freq: Two times a day (BID) | CUTANEOUS | 11 refills | Status: DC

## 2020-08-05 NOTE — Progress Notes (Addendum)
GYNECOLOGY ANNUAL PREVENTATIVE CARE ENCOUNTER NOTE  Subjective:   Brittany [name] is a 39 y.o. [phone] female here for a routine annual gynecologic exam.  Current complaints: none. Is looking at getting becoming pregnant soon. Has started to take vitamins. Still no period after nexplanon removed earlier this month, although had withdrawal bleed.   Denies abnormal vaginal bleeding, discharge, pelvic pain, problems with intercourse or other gynecologic concerns.    Gynecologic History Patient's last menstrual period was 07/13/2020. Patient is sexually active  Contraception: none Last Pap: 2018. Results were: normal. Does have history of abnormal PAP with colpo and LEEP Last mammogram: n/a.  Obstetric History OB History  Gravida Para Term Preterm AB Living  5 2 1 1 3 3   SAB TAB Ectopic Multiple Live Births  2 1   1 3     # Outcome Date GA Lbr Len/2nd Weight Sex Delivery Anes PTL Lv  5A Preterm 04/30/19 [redacted]w[redacted]d 03:35 / 00:18 6 lb 2.4 oz (2.79 kg) M Vag-Spont EPI  LIV  5B Preterm 04/30/19 [redacted]w[redacted]d 03:35 / 00:33 5 lb 15.2 oz (2.7 kg) M Vag-Spont EPI  LIV  4 Term 2000 [redacted]w[redacted]d   F Vag-Spont EPI N LIV  3 SAB           2 SAB           1 TAB             Past Medical History:  Diagnosis Date  . Anemia   . Lupus (HCC)   . Sarcoidosis   . Urticaria     Past Surgical History:  Procedure Laterality Date  . BIOPSY THYROID    . DILATION AND CURETTAGE OF UTERUS    . DILATION AND CURETTAGE, DIAGNOSTIC / THERAPEUTIC  01/04/2018    Current Outpatient Medications on File Prior to Visit  Medication Sig Dispense Refill  . acetaminophen (TYLENOL) 325 MG tablet Take 2 tablets (650 mg total) by mouth every 4 (four) hours as needed (for pain scale < 4). 30 tablet 0  . Cetirizine HCl (ZYRTEC PO) Take by mouth.    . Cyanocobalamin (B-12 COMPLIANCE INJECTION IJ) Inject as directed.    . hydroxychloroquine (PLAQUENIL) 200 MG tablet Take by mouth.    . oxyCODONE (OXY IR/ROXICODONE) 5 MG  immediate release tablet Take 1 tablet (5 mg total) by mouth every 4 (four) hours as needed (pain scale 4-7). 15 tablet 0  . predniSONE (DELTASONE) 5 MG tablet 5 mg daily as needed.     [redacted]w[redacted]d ibuprofen (ADVIL) 600 MG tablet Take 1 tablet (600 mg total) by mouth every 6 (six) hours. (Patient not taking: Reported on 07/09/2020) 30 tablet 0  . montelukast (SINGULAIR) 10 MG tablet TAKE 1 TABLET BY MOUTH AT BEDTIME (Patient not taking: Reported on 07/09/2020) 90 tablet 0  . Prenatal Vit-Fe Fumarate-FA (PRENATAL MULTIVITAMIN) TABS tablet Take 1 tablet by mouth daily at 12 noon. (Patient not taking: Reported on 07/09/2020)     No current facility-administered medications on file prior to visit.    Allergies  Allergen Reactions  . Latex Hives  . Strawberry (Diagnostic) Rash    Social History   Socioeconomic History  . Marital status: Married    Spouse name: Not on file  . Number of children: Not on file  . Years of education: Not on file  . Highest education level: Not on file  Occupational History  . Not on file  Tobacco Use  . Smoking status: Never Smoker  .  Smokeless tobacco: Never Used  Vaping Use  . Vaping Use: Never used  Substance and Sexual Activity  . Alcohol use: Not Currently    Alcohol/week: 1.0 standard drink    Types: 1 Glasses of wine per week    Comment: social drinker  . Drug use: Never  . Sexual activity: Yes    Birth control/protection: None  Other Topics Concern  . Not on file  Social History Narrative  . Not on file   Social Determinants of Health   Financial Resource Strain:   . Difficulty of Paying Living Expenses: Not on file  Food Insecurity:   . Worried About Programme researcher, broadcasting/film/video in the Last Year: Not on file  . Ran Out of Food in the Last Year: Not on file  Transportation Needs:   . Lack of Transportation (Medical): Not on file  . Lack of Transportation (Non-Medical): Not on file  Physical Activity:   . Days of Exercise per Week: Not on file  . Minutes  of Exercise per Session: Not on file  Stress:   . Feeling of Stress : Not on file  Social Connections:   . Frequency of Communication with Friends and Family: Not on file  . Frequency of Social Gatherings with Friends and Family: Not on file  . Attends Religious Services: Not on file  . Active Member of Clubs or Organizations: Not on file  . Attends Banker Meetings: Not on file  . Marital Status: Not on file  Intimate Partner Violence:   . Fear of Current or Ex-Partner: Not on file  . Emotionally Abused: Not on file  . Physically Abused: Not on file  . Sexually Abused: Not on file    Family History  Problem Relation Age of Onset  . Hypertension Mother   . Anemia Mother   . Food Allergy Mother        red dye, tomato, strawberry  . Allergic rhinitis Father   . Prostate cancer Father   . Heart disease Father   . Allergy (severe) Father        insect bee stings  . Allergic rhinitis Brother   . Hypertension Brother   . Heart disease Paternal Grandfather     The following portions of the patient's history were reviewed and updated as appropriate: allergies, current medications, past family history, past medical history, past social history, past surgical history and problem list.  Review of Systems Pertinent items are noted in HPI.   Objective:  BP 101/62   Pulse 82   Ht 5\' 8"  (1.727 m)   Wt 213 lb (96.6 kg)   LMP 07/13/2020   BMI 32.39 kg/m  Wt Readings from Last 3 Encounters:  08/05/20 213 lb (96.6 kg)  07/09/20 210 lb (95.3 kg)  05/02/19 209 lb 8 oz (95 kg)     Chaperone present during exam  CONSTITUTIONAL: Well-developed, well-nourished female in no acute distress.  HENT:  Normocephalic, atraumatic, External right and left ear normal. Oropharynx is clear and moist EYES: Conjunctivae and EOM are normal. Pupils are equal, round, and reactive to light. No scleral icterus.  NECK: Normal range of motion, supple, no masses.  Normal thyroid.    CARDIOVASCULAR: Normal heart rate noted, regular rhythm RESPIRATORY: Clear to auscultation bilaterally. Effort and breath sounds normal, no problems with respiration noted. BREASTS: Symmetric in size. No masses, skin changes, nipple drainage, or lymphadenopathy. ABDOMEN: Soft, normal bowel sounds, no distention noted.  No tenderness, rebound or guarding.  PELVIC: Normal appearing external genitalia; normal appearing vaginal mucosa and cervix.  No abnormal discharge noted.  Normal uterine size, no other palpable masses, no uterine or adnexal tenderness. MUSCULOSKELETAL: Normal range of motion. No tenderness.  No cyanosis, clubbing, or edema.  2+ distal pulses. SKIN: Stage 2 hidradenitis in axilla bilaterally. No current abscess, but mild scarring. Skin is warm and dry. No rash noted. Not diaphoretic. No erythema. No pallor. NEUROLOGIC: Alert and oriented to person, place, and time. Normal reflexes, muscle tone coordination. No cranial nerve deficit noted. PSYCHIATRIC: Normal mood and affect. Normal behavior. Normal judgment and thought content.  Assessment:  Annual gynecologic examination with pap smear   Plan:  1. Well Woman Exam Will follow up results of pap smear and manage accordingly. Mammogram scheduled - Cytology - PAP( ) - MM 3D SCREEN BREAST BILATERAL; Future  2. Hidradenitis Clindamycin gel  3. Other systemic lupus erythematosus with other organ involvement (HCC) Reports having lupus anticoagulant testing done, which was negative  4. Sarcoidosis   Routine preventative health maintenance measures emphasized. Please refer to After Visit Summary for other counseling recommendations.    Candelaria Celeste, DO [facility]

## 2020-08-10 LAB — CYTOLOGY - PAP
Comment: NEGATIVE
Diagnosis: UNDETERMINED — AB
High risk HPV: POSITIVE — AB

## 2020-08-11 ENCOUNTER — Telehealth: Payer: Self-pay

## 2020-08-11 NOTE — Telephone Encounter (Signed)
Called pt regarding Pap Smear results. 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 Pt also made aware that she will need to have a Colposcopy. Understanding was voiced. Brittany [name] l Brittany [name], CMA

## 2020-08-11 NOTE — Telephone Encounter (Signed)
-----   Message from Levie Heritage, DO sent at 08/11/2020 12:10 PM EDT ----- Patient had abnormal PAP - ASCUS +HPV. Please let patient know and schedule colposcopy.

## 2020-08-17 ENCOUNTER — Ambulatory Visit (HOSPITAL_BASED_OUTPATIENT_CLINIC_OR_DEPARTMENT_OTHER)
Admission: RE | Admit: 2020-08-17 | Discharge: 2020-08-17 | Disposition: A | Discharge status: Home / Self Care | Payer: BC Managed Care – PPO | Source: Ambulatory Visit | Attending: Family Medicine | Admitting: Family Medicine

## 2020-08-17 ENCOUNTER — Encounter (HOSPITAL_BASED_OUTPATIENT_CLINIC_OR_DEPARTMENT_OTHER): Payer: Self-pay

## 2020-08-17 ENCOUNTER — Other Ambulatory Visit: Payer: Self-pay

## 2020-08-17 DIAGNOSIS — Z1231 Encounter for screening mammogram for malignant neoplasm of breast: Secondary | ICD-10-CM | POA: Diagnosis present

## 2020-08-17 DIAGNOSIS — Z01419 Encounter for gynecological examination (general) (routine) without abnormal findings: Secondary | ICD-10-CM

## 2020-08-27 ENCOUNTER — Ambulatory Visit (INDEPENDENT_AMBULATORY_CARE_PROVIDER_SITE_OTHER): Payer: BC Managed Care – PPO | Admitting: Family Medicine

## 2020-08-27 ENCOUNTER — Other Ambulatory Visit (HOSPITAL_COMMUNITY)
Admission: RE | Admit: 2020-08-27 | Discharge: 2020-08-27 | Disposition: A | Discharge status: Home / Self Care | Payer: BC Managed Care – PPO | Source: Ambulatory Visit | Attending: Family Medicine | Admitting: Family Medicine

## 2020-08-27 ENCOUNTER — Other Ambulatory Visit: Payer: Self-pay

## 2020-08-27 ENCOUNTER — Encounter: Payer: Self-pay | Admitting: Family Medicine

## 2020-08-27 VITALS — BP 109/80 | HR 85 | Ht 68.0 in | Wt 216.0 lb

## 2020-08-27 DIAGNOSIS — R87810 Cervical high risk human papillomavirus (HPV) DNA test positive: Secondary | ICD-10-CM

## 2020-08-27 DIAGNOSIS — R87610 Atypical squamous cells of undetermined significance on cytologic smear of cervix (ASC-US): Secondary | ICD-10-CM | POA: Insufficient documentation

## 2020-08-27 DIAGNOSIS — R87619 Unspecified abnormal cytological findings in specimens from cervix uteri: Secondary | ICD-10-CM

## 2020-08-27 HISTORY — DX: Unspecified abnormal cytological findings in specimens from cervix uteri: R87.619

## 2020-08-27 NOTE — Progress Notes (Signed)
Patient Name: Brittany [name], female   DOB: [birth year], 39 y.o.  MRN: [id number]  Colposcopy Procedure Note:  [id number] Pregnancy status: Unknown Indications: ASCUS, +HR HPV Cervical History:  Previous Abnormal Pap: "Mildly abnormal" PAP for a few years   Previous LEEP or Cryo: LEEP 2017  Smoking: Never Smoked Hysterectomy: No   Patient given informed consent, signed copy in the chart, time out was performed.    Exam: Vulva and Vagina grossly normal.  Cervix viewed with speculum and colposcope after application of acetic acid:  Cervix Fully Visualized Squamocolumnar Junction Visibility: Fully visualized  Acetowhite lesions: none  Other Lesions: extropion at 6 o'clock Punctation: Not present  Mosaicism: Not present Abnormal vasculature: No   Biopsies: none ECC: brush  Hemostasis achieved with:  none needed  Colposcopy Impression:  Benign   Patient was given post procedure instructions.  Will call patient with results.

## 2020-08-31 LAB — SURGICAL PATHOLOGY

## 2020-09-01 ENCOUNTER — Telehealth: Payer: Self-pay | Admitting: *Deleted

## 2020-09-01 NOTE — Telephone Encounter (Addendum)
-----   Message from Levie Heritage, DO sent at 08/31/2020  8:52 PM EDT ----- Please let pt know that the biopsy results were normal. We need to repeat a PAP in year to follow this.  9/29 1720 Called pt and informed her of Colpo results as stated by Dr. Adrian Blackwater. Pt was advised of need for repeat Pap in one year. She voiced understanding and had no additional questions.

## 2020-09-02 DIAGNOSIS — E041 Nontoxic single thyroid nodule: Secondary | ICD-10-CM | POA: Diagnosis not present

## 2020-09-02 DIAGNOSIS — R5383 Other fatigue: Secondary | ICD-10-CM | POA: Diagnosis not present

## 2020-09-23 DIAGNOSIS — E041 Nontoxic single thyroid nodule: Secondary | ICD-10-CM | POA: Diagnosis not present

## 2020-09-23 DIAGNOSIS — R809 Proteinuria, unspecified: Secondary | ICD-10-CM | POA: Diagnosis not present

## 2020-11-10 DIAGNOSIS — L089 Local infection of the skin and subcutaneous tissue, unspecified: Secondary | ICD-10-CM | POA: Diagnosis not present

## 2020-11-10 DIAGNOSIS — L930 Discoid lupus erythematosus: Secondary | ICD-10-CM | POA: Diagnosis not present

## 2020-11-12 DIAGNOSIS — L930 Discoid lupus erythematosus: Secondary | ICD-10-CM | POA: Diagnosis not present

## 2020-11-12 DIAGNOSIS — L089 Local infection of the skin and subcutaneous tissue, unspecified: Secondary | ICD-10-CM | POA: Diagnosis not present

## 2020-11-22 DIAGNOSIS — R3129 Other microscopic hematuria: Secondary | ICD-10-CM | POA: Diagnosis not present

## 2020-11-22 DIAGNOSIS — R809 Proteinuria, unspecified: Secondary | ICD-10-CM | POA: Diagnosis not present

## 2020-11-22 DIAGNOSIS — M329 Systemic lupus erythematosus, unspecified: Secondary | ICD-10-CM | POA: Diagnosis not present

## 2020-11-24 DIAGNOSIS — E669 Obesity, unspecified: Secondary | ICD-10-CM | POA: Diagnosis not present

## 2020-11-24 DIAGNOSIS — R3129 Other microscopic hematuria: Secondary | ICD-10-CM | POA: Diagnosis not present

## 2020-11-24 DIAGNOSIS — M329 Systemic lupus erythematosus, unspecified: Secondary | ICD-10-CM | POA: Diagnosis not present

## 2020-12-28 DIAGNOSIS — D509 Iron deficiency anemia, unspecified: Secondary | ICD-10-CM | POA: Diagnosis not present

## 2020-12-28 DIAGNOSIS — D709 Neutropenia, unspecified: Secondary | ICD-10-CM | POA: Diagnosis not present

## 2020-12-28 DIAGNOSIS — M329 Systemic lupus erythematosus, unspecified: Secondary | ICD-10-CM | POA: Diagnosis not present

## 2020-12-28 DIAGNOSIS — D508 Other iron deficiency anemias: Secondary | ICD-10-CM | POA: Diagnosis not present

## 2020-12-28 DIAGNOSIS — E538 Deficiency of other specified B group vitamins: Secondary | ICD-10-CM | POA: Diagnosis not present

## 2021-01-15 DIAGNOSIS — M7989 Other specified soft tissue disorders: Secondary | ICD-10-CM | POA: Diagnosis not present

## 2021-01-15 DIAGNOSIS — M25571 Pain in right ankle and joints of right foot: Secondary | ICD-10-CM | POA: Diagnosis not present

## 2021-01-15 DIAGNOSIS — S82841A Displaced bimalleolar fracture of right lower leg, initial encounter for closed fracture: Secondary | ICD-10-CM | POA: Diagnosis not present

## 2021-01-27 DIAGNOSIS — M25571 Pain in right ankle and joints of right foot: Secondary | ICD-10-CM | POA: Diagnosis not present

## 2021-01-27 DIAGNOSIS — S82841A Displaced bimalleolar fracture of right lower leg, initial encounter for closed fracture: Secondary | ICD-10-CM | POA: Diagnosis not present

## 2021-01-27 DIAGNOSIS — M25471 Effusion, right ankle: Secondary | ICD-10-CM | POA: Diagnosis not present

## 2021-02-24 DIAGNOSIS — M25471 Effusion, right ankle: Secondary | ICD-10-CM | POA: Diagnosis not present

## 2021-02-24 DIAGNOSIS — M25571 Pain in right ankle and joints of right foot: Secondary | ICD-10-CM | POA: Diagnosis not present

## 2021-03-02 DIAGNOSIS — L930 Discoid lupus erythematosus: Secondary | ICD-10-CM | POA: Diagnosis not present

## 2021-03-02 DIAGNOSIS — L089 Local infection of the skin and subcutaneous tissue, unspecified: Secondary | ICD-10-CM | POA: Diagnosis not present

## 2021-03-11 DIAGNOSIS — E042 Nontoxic multinodular goiter: Secondary | ICD-10-CM | POA: Diagnosis not present

## 2021-05-04 DIAGNOSIS — L930 Discoid lupus erythematosus: Secondary | ICD-10-CM | POA: Diagnosis not present

## 2021-07-08 ENCOUNTER — Other Ambulatory Visit (HOSPITAL_BASED_OUTPATIENT_CLINIC_OR_DEPARTMENT_OTHER): Payer: Self-pay | Admitting: Internal Medicine

## 2021-07-08 DIAGNOSIS — Z1231 Encounter for screening mammogram for malignant neoplasm of breast: Secondary | ICD-10-CM

## 2021-07-16 DIAGNOSIS — R350 Frequency of micturition: Secondary | ICD-10-CM | POA: Diagnosis not present

## 2021-08-07 IMAGING — MG DIGITAL SCREENING BILAT W/ TOMO W/ CAD
6 of 10 series · 6 of 30 positions shown · non-contrast
Comparison: None.

CLINICAL DATA: Screening.

EXAM:
DIGITAL SCREENING BILATERAL MAMMOGRAM WITH TOMO AND CAD

[L CC synth-2D]
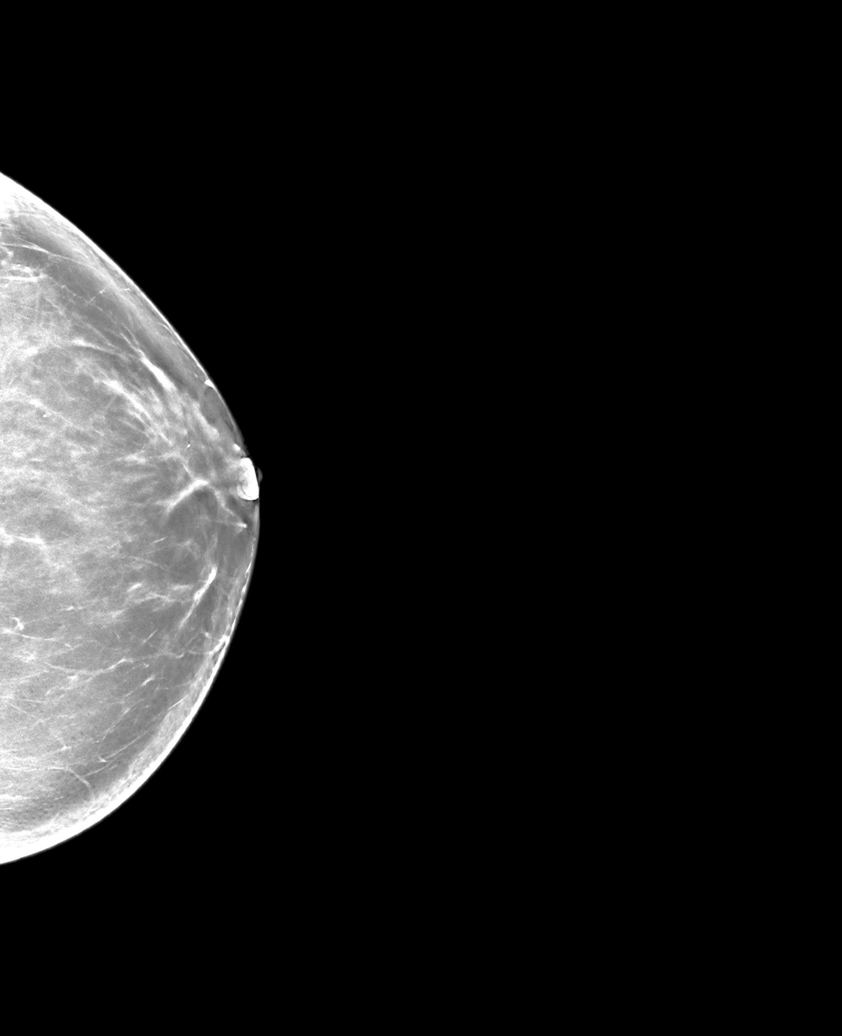

[R MLO synth-2D]
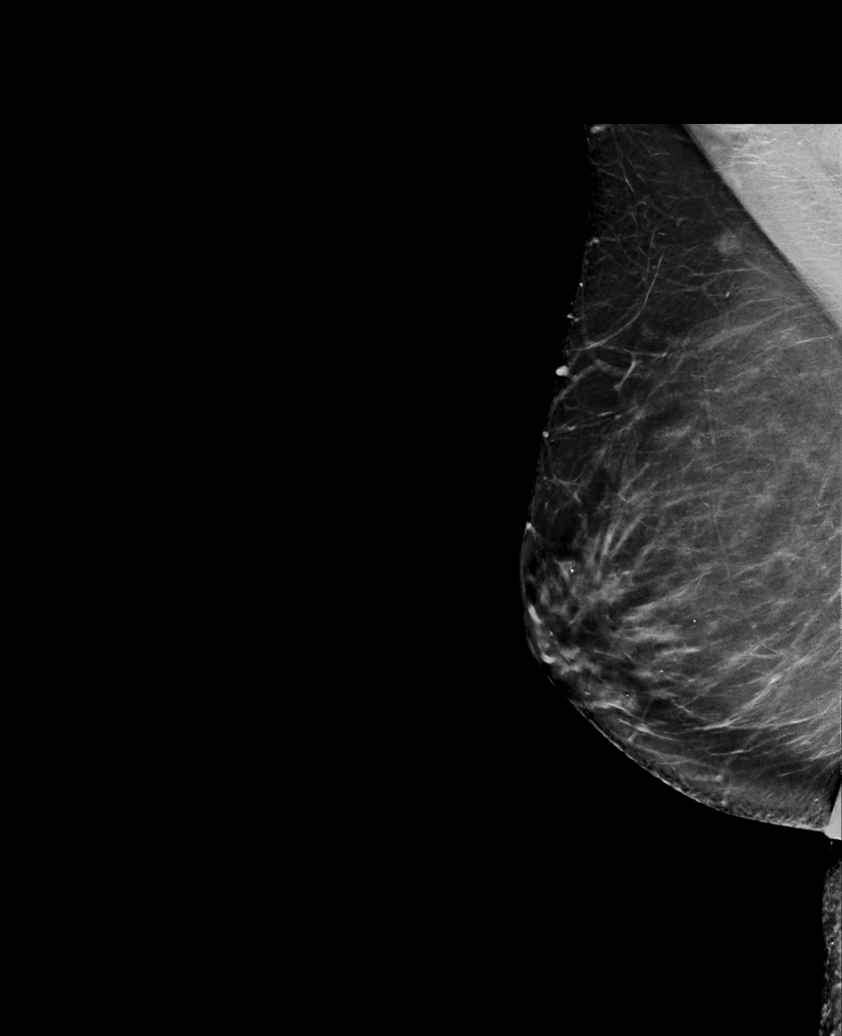

[L MLO synth-2D]
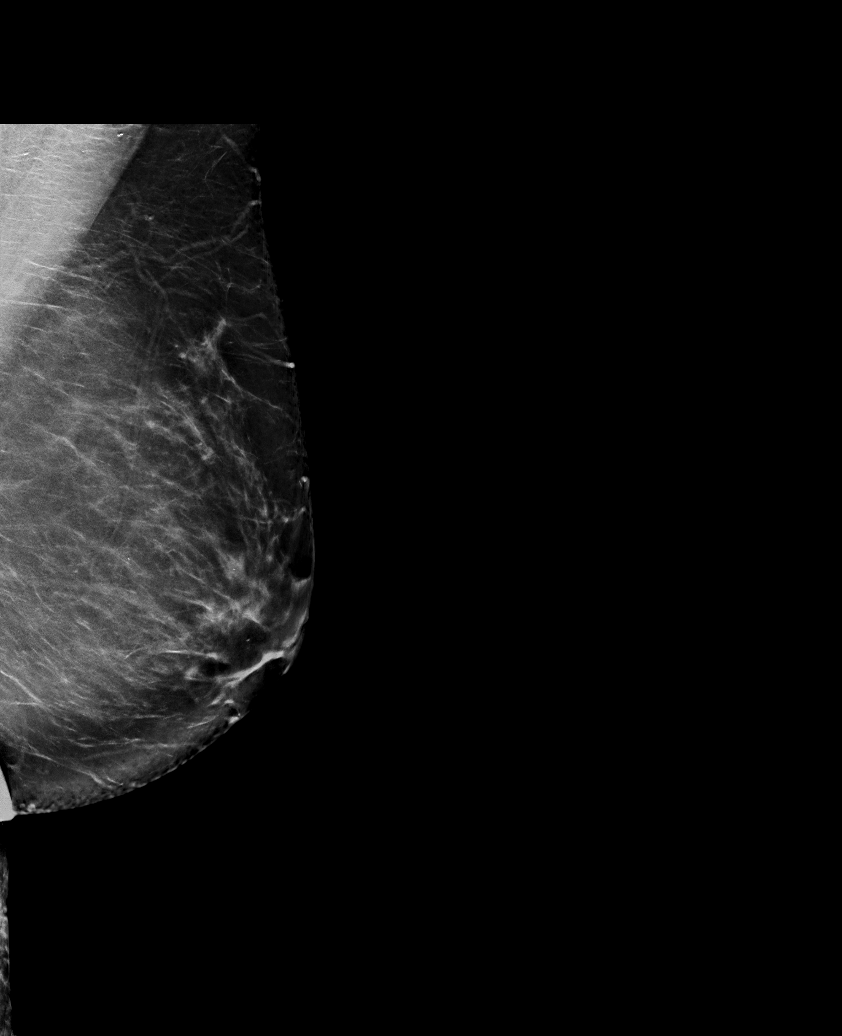

[R XCCL synth-2D]
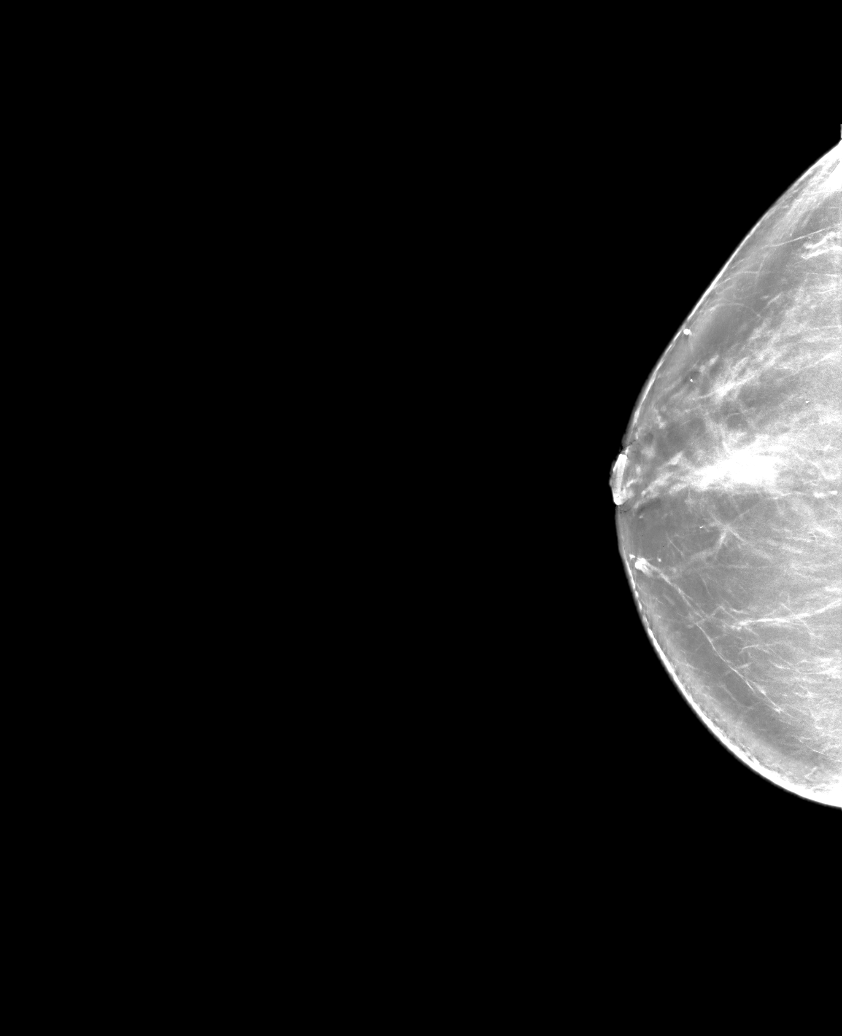

[R CC synth-2D]
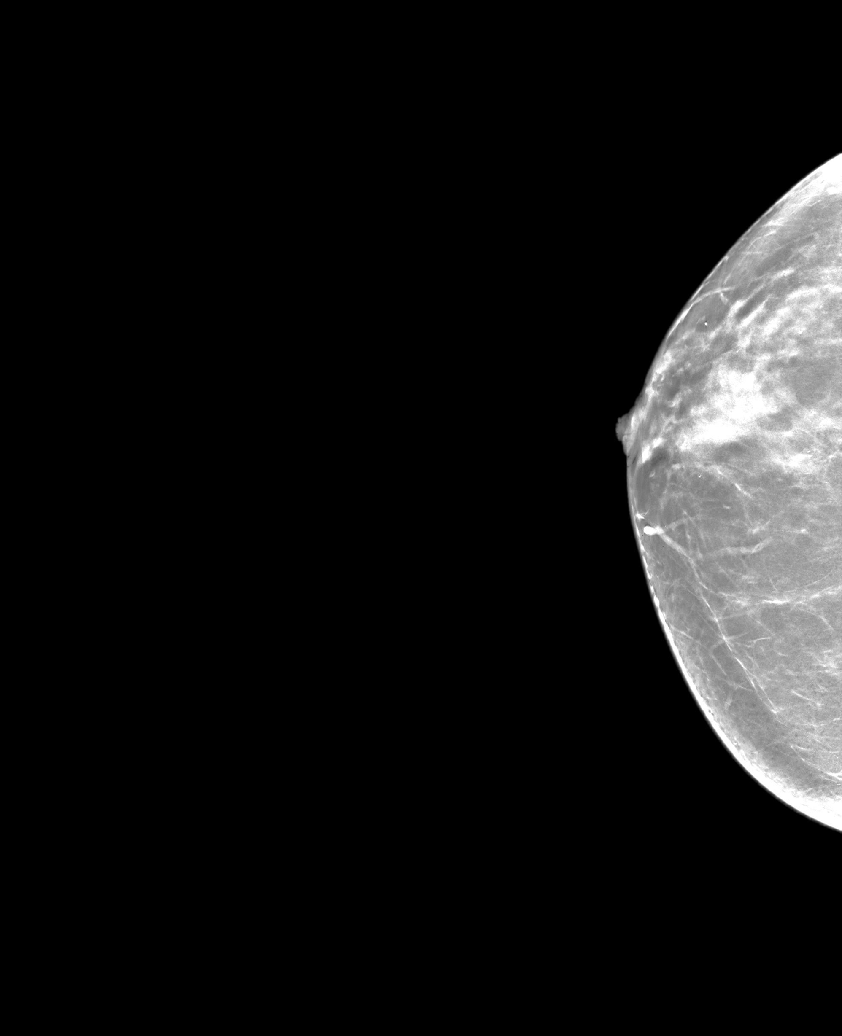

[R CC tomo · tomo slice 35/70.0]
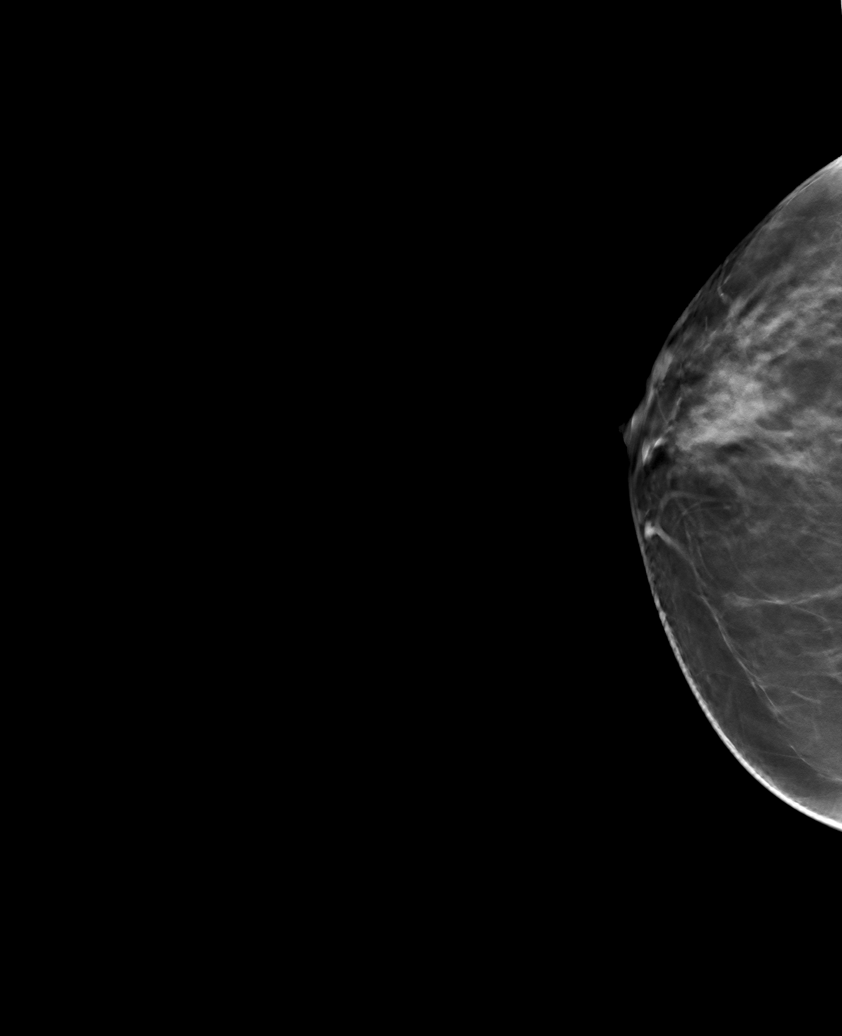

[6 of 30 positions shown; findings below may reference images not displayed]

ACR Breast Density Category b: There are scattered areas of
fibroglandular density.
FINDINGS: There are no findings suspicious for malignancy. Images were
processed with CAD.
IMPRESSION: No mammographic evidence of malignancy. A result letter of this
screening mammogram will be mailed directly to the patient.

RECOMMENDATION:
Screening mammogram in one year. (Code:Y5-G-EJ6)

BI-RADS CATEGORY  1: Negative.

## 2021-08-19 ENCOUNTER — Other Ambulatory Visit: Payer: Self-pay | Admitting: Internal Medicine

## 2021-08-19 DIAGNOSIS — Z1231 Encounter for screening mammogram for malignant neoplasm of breast: Secondary | ICD-10-CM

## 2021-08-22 ENCOUNTER — Ambulatory Visit (HOSPITAL_BASED_OUTPATIENT_CLINIC_OR_DEPARTMENT_OTHER): Payer: BC Managed Care – PPO

## 2021-08-26 ENCOUNTER — Ambulatory Visit: Payer: BC Managed Care – PPO

## 2021-08-30 ENCOUNTER — Telehealth (HOSPITAL_BASED_OUTPATIENT_CLINIC_OR_DEPARTMENT_OTHER): Payer: Self-pay

## 2021-09-02 DIAGNOSIS — E042 Nontoxic multinodular goiter: Secondary | ICD-10-CM | POA: Diagnosis not present

## 2021-09-06 ENCOUNTER — Ambulatory Visit (HOSPITAL_BASED_OUTPATIENT_CLINIC_OR_DEPARTMENT_OTHER)
Admission: RE | Admit: 2021-09-06 | Discharge: 2021-09-06 | Disposition: A | Discharge status: Home / Self Care | Payer: BC Managed Care – PPO | Source: Ambulatory Visit | Attending: Internal Medicine | Admitting: Internal Medicine

## 2021-09-06 ENCOUNTER — Other Ambulatory Visit: Payer: Self-pay

## 2021-09-06 DIAGNOSIS — Z1231 Encounter for screening mammogram for malignant neoplasm of breast: Secondary | ICD-10-CM | POA: Diagnosis not present

## 2021-09-16 DIAGNOSIS — M329 Systemic lupus erythematosus, unspecified: Secondary | ICD-10-CM | POA: Diagnosis not present

## 2021-09-16 DIAGNOSIS — D709 Neutropenia, unspecified: Secondary | ICD-10-CM | POA: Diagnosis not present

## 2021-09-16 DIAGNOSIS — Z79899 Other long term (current) drug therapy: Secondary | ICD-10-CM | POA: Diagnosis not present

## 2021-09-23 ENCOUNTER — Other Ambulatory Visit (HOSPITAL_COMMUNITY)
Admission: RE | Admit: 2021-09-23 | Discharge: 2021-09-23 | Disposition: A | Discharge status: Home / Self Care | Payer: Medicaid Other | Source: Ambulatory Visit | Attending: Family Medicine | Admitting: Family Medicine

## 2021-09-23 ENCOUNTER — Encounter: Payer: Self-pay | Admitting: Family Medicine

## 2021-09-23 ENCOUNTER — Other Ambulatory Visit: Payer: Self-pay

## 2021-09-23 ENCOUNTER — Ambulatory Visit: Payer: Medicaid Other | Admitting: Family Medicine

## 2021-09-23 VITALS — BP 116/76 | HR 73 | Ht 68.0 in | Wt 200.0 lb

## 2021-09-23 DIAGNOSIS — Z01419 Encounter for gynecological examination (general) (routine) without abnormal findings: Secondary | ICD-10-CM

## 2021-09-23 DIAGNOSIS — E042 Nontoxic multinodular goiter: Secondary | ICD-10-CM | POA: Diagnosis not present

## 2021-09-23 DIAGNOSIS — L732 Hidradenitis suppurativa: Secondary | ICD-10-CM | POA: Diagnosis not present

## 2021-09-23 DIAGNOSIS — R87810 Cervical high risk human papillomavirus (HPV) DNA test positive: Secondary | ICD-10-CM | POA: Diagnosis not present

## 2021-09-23 DIAGNOSIS — R87610 Atypical squamous cells of undetermined significance on cytologic smear of cervix (ASC-US): Secondary | ICD-10-CM | POA: Diagnosis not present

## 2021-09-23 MED ORDER — FLUCONAZOLE 150 MG PO TABS: 150.0000 mg | ORAL_TABLET | Freq: Once | ORAL | 3 refills | Status: AC

## 2021-09-23 MED ORDER — CLINDAMYCIN PHOSPHATE 1 % EX SOLN: Freq: Two times a day (BID) | CUTANEOUS | 6 refills | Status: DC

## 2021-09-23 MED ORDER — CEFADROXIL 500 MG PO CAPS: 500.0000 mg | ORAL_CAPSULE | Freq: Two times a day (BID) | ORAL | 0 refills | Status: DC

## 2021-09-23 NOTE — Progress Notes (Signed)
GYNECOLOGY ANNUAL PREVENTATIVE CARE ENCOUNTER NOTE  Subjective:   Brittany [name] is a 40 y.o. [phone] female here for a routine annual gynecologic exam.  Current complaints: none.   Denies abnormal vaginal bleeding, discharge, pelvic pain, problems with intercourse or other gynecologic concerns.    Gynecologic History Patient's last menstrual period was 09/07/2021. Patient is sexually active  Contraception: condoms Last Pap: 2021. Results were: ASCUS +HPV. Colpo normal Last mammogram: 2022. Results were: Birads 1 Colorectal Cancer Screening: n/a.  Obstetric History OB History  Gravida Para Term Preterm AB Living  5 2 1 1 3 3   SAB IAB Ectopic Multiple Live Births  2 1   1 3     # Outcome Date GA Lbr Len/2nd Weight Sex Delivery Anes PTL Lv  5A Preterm 04/30/19 [redacted]w[redacted]d 03:35 / 00:18 6 lb 2.4 oz (2.79 kg) M Vag-Spont EPI  LIV  5B Preterm 04/30/19 [redacted]w[redacted]d 03:35 / 00:33 5 lb 15.2 oz (2.7 kg) M Vag-Spont EPI  LIV  4 Term 2000 [redacted]w[redacted]d   F Vag-Spont EPI N LIV  3 SAB           2 SAB           1 IAB             Past Medical History:  Diagnosis Date   Anemia    Lupus (HCC)    Sarcoidosis    Urticaria     Past Surgical History:  Procedure Laterality Date   BIOPSY THYROID     DILATION AND CURETTAGE OF UTERUS     DILATION AND CURETTAGE, DIAGNOSTIC / THERAPEUTIC  01/04/2018    Current Outpatient Medications on File Prior to Visit  Medication Sig Dispense Refill   acetaminophen (TYLENOL) 325 MG tablet Take 2 tablets (650 mg total) by mouth every 4 (four) hours as needed (for pain scale < 4). 30 tablet 0   Cetirizine HCl (ZYRTEC PO) Take by mouth.     Cyanocobalamin (B-12 COMPLIANCE INJECTION IJ) Inject as directed.     hydroxychloroquine (PLAQUENIL) 200 MG tablet Take by mouth.     Multiple Vitamin (MULTIVITAMIN) capsule Take 1 capsule by mouth daily.     predniSONE (DELTASONE) 5 MG tablet 5 mg daily as needed.      No current facility-administered medications on file  prior to visit.    Allergies  Allergen Reactions   Latex Hives   Strawberry (Diagnostic) Rash    Social History   Socioeconomic History   Marital status: Married    Spouse name: Not on file   Number of children: Not on file   Years of education: Not on file   Highest education level: Not on file  Occupational History   Not on file  Tobacco Use   Smoking status: Never   Smokeless tobacco: Never  Vaping Use   Vaping Use: Never used  Substance and Sexual Activity   Alcohol use: Not Currently    Alcohol/week: 1.0 standard drink    Types: 1 Glasses of wine per week    Comment: social drinker   Drug use: Never   Sexual activity: Yes    Birth control/protection: None  Other Topics Concern   Not on file  Social History Narrative   Not on file   Social Determinants of Health   Financial Resource Strain: Not on file  Food Insecurity: Not on file  Transportation Needs: Not on file  Physical Activity: Not on file  Stress: Not on file  Social Connections: Not on file  Intimate Partner Violence: Not on file    Family History  Problem Relation Age of Onset   Hypertension Mother    Anemia Mother    Food Allergy Mother        red dye, tomato, strawberry   Allergic rhinitis Father    Prostate cancer Father    Heart disease Father    Allergy (severe) Father        insect bee stings   Allergic rhinitis Brother    Hypertension Brother    Heart disease Paternal Grandfather     The following portions of the patient's history were reviewed and updated as appropriate: allergies, current medications, past family history, past medical history, past social history, past surgical history and problem list.  Review of Systems Pertinent items are noted in HPI.   Objective:  BP 116/76   Pulse 73   Ht 5\' 8"  (1.727 m)   Wt 200 lb (90.7 kg)   LMP 09/07/2021   BMI 30.41 kg/m  Wt Readings from Last 3 Encounters:  09/23/21 200 lb (90.7 kg)  08/27/20 216 lb (98 kg)  08/05/20  213 lb (96.6 kg)     Chaperone present during exam  CONSTITUTIONAL: Well-developed, well-nourished female in no acute distress.  HENT:  Normocephalic, atraumatic, External right and left ear normal. Oropharynx is clear and moist EYES: Conjunctivae and EOM are normal. Pupils are equal, round, and reactive to light. No scleral icterus.  NECK: Normal range of motion, supple, no masses.  Normal thyroid.   CARDIOVASCULAR: Normal heart rate noted, regular rhythm RESPIRATORY: Clear to auscultation bilaterally. Effort and breath sounds normal, no problems with respiration noted. BREASTS: Symmetric in size. No masses, skin changes, nipple drainage, or lymphadenopathy. ABDOMEN: Soft, normal bowel sounds, no distention noted.  No tenderness, rebound or guarding.  PELVIC: Normal appearing external genitalia; normal appearing vaginal mucosa and cervix.  No abnormal discharge noted.  Extropion at 6 o'clock MUSCULOSKELETAL: Normal range of motion. No tenderness.  No cyanosis, clubbing, or edema.  2+ distal pulses. SKIN: Stage 1 axillary hidradinitis bilaterally. Has slightly inflamed area on left. Skin is warm and dry. No rash noted. Not diaphoretic. No erythema. No pallor. NEUROLOGIC: Alert and oriented to person, place, and time. Normal reflexes, muscle tone coordination. No cranial nerve deficit noted. PSYCHIATRIC: Normal mood and affect. Normal behavior. Normal judgment and thought content.  Assessment:  Annual gynecologic examination with pap smear   Plan:  1. Well Woman Exam Will follow up results of pap smear and manage accordingly. Mammogram reviewed STD testing discussed. Patient requested vaginal testing  - Cytology - PAP( [place])  2. ASCUS with positive high risk HPV cervical Repeat PAP - Cytology - PAP( [place])  3. Hydradenitis Duricef today. Clindamycin topically for prophylasis.   Routine preventative health maintenance measures emphasized. Please refer to After Visit  Summary for other counseling recommendations.    10/05/20, DO [facility]

## 2021-09-27 ENCOUNTER — Telehealth: Payer: Self-pay

## 2021-09-27 ENCOUNTER — Encounter: Payer: Self-pay | Admitting: Family Medicine

## 2021-09-27 LAB — CYTOLOGY - PAP
Chlamydia: NEGATIVE
Comment: NEGATIVE
Comment: NEGATIVE
Comment: NORMAL
Diagnosis: NEGATIVE
High risk HPV: POSITIVE — AB
Neisseria Gonorrhea: NEGATIVE

## 2021-09-27 NOTE — Telephone Encounter (Signed)
Called pt to discuss Pap smear results. Pt made aware that her Pap smear showed high risk HPV, but cells are normal. She will need to repeat her Pap in 1 year. Understanding was voiced. Jacob Cicero l Michayla Mcneil, CMA

## 2021-09-27 NOTE — Telephone Encounter (Signed)
-----   Message from Levie Heritage, DO sent at 09/27/2021  2:30 PM EDT ----- PAP showed HPV, but cells are normal. Needs to repeat PAP in 1 year

## 2021-12-02 DIAGNOSIS — L089 Local infection of the skin and subcutaneous tissue, unspecified: Secondary | ICD-10-CM | POA: Diagnosis not present

## 2021-12-02 DIAGNOSIS — Z79899 Other long term (current) drug therapy: Secondary | ICD-10-CM | POA: Diagnosis not present

## 2021-12-02 DIAGNOSIS — L930 Discoid lupus erythematosus: Secondary | ICD-10-CM | POA: Diagnosis not present

## 2021-12-23 DIAGNOSIS — D709 Neutropenia, unspecified: Secondary | ICD-10-CM | POA: Diagnosis not present

## 2021-12-23 DIAGNOSIS — E538 Deficiency of other specified B group vitamins: Secondary | ICD-10-CM | POA: Diagnosis not present

## 2021-12-23 DIAGNOSIS — D509 Iron deficiency anemia, unspecified: Secondary | ICD-10-CM | POA: Diagnosis not present

## 2021-12-23 DIAGNOSIS — D508 Other iron deficiency anemias: Secondary | ICD-10-CM | POA: Diagnosis not present

## 2021-12-23 DIAGNOSIS — D638 Anemia in other chronic diseases classified elsewhere: Secondary | ICD-10-CM | POA: Diagnosis not present

## 2022-01-06 ENCOUNTER — Other Ambulatory Visit: Payer: Self-pay

## 2022-01-06 ENCOUNTER — Other Ambulatory Visit (HOSPITAL_COMMUNITY)
Admission: RE | Admit: 2022-01-06 | Discharge: 2022-01-06 | Disposition: A | Discharge status: Home / Self Care | Payer: Medicaid Other | Source: Ambulatory Visit | Attending: Obstetrics and Gynecology | Admitting: Obstetrics and Gynecology

## 2022-01-06 ENCOUNTER — Ambulatory Visit: Payer: Medicaid Other | Admitting: Family Medicine

## 2022-01-06 ENCOUNTER — Encounter: Payer: Self-pay | Admitting: Family Medicine

## 2022-01-06 ENCOUNTER — Ambulatory Visit (HOSPITAL_BASED_OUTPATIENT_CLINIC_OR_DEPARTMENT_OTHER): Admission: RE | Admit: 2022-01-06 | Payer: Medicaid Other | Source: Ambulatory Visit

## 2022-01-06 VITALS — BP 109/65 | HR 73 | Ht 68.0 in | Wt 197.0 lb

## 2022-01-06 DIAGNOSIS — R1032 Left lower quadrant pain: Secondary | ICD-10-CM | POA: Diagnosis not present

## 2022-01-06 DIAGNOSIS — Z113 Encounter for screening for infections with a predominantly sexual mode of transmission: Secondary | ICD-10-CM

## 2022-01-06 NOTE — Progress Notes (Signed)
Patient presents for ongoing abdominal pain. Patient states that the pain started on Saturday Jan 28th. Armandina Stammer RN

## 2022-01-06 NOTE — Progress Notes (Signed)
° °  Subjective:    Patient ID: Brittany [name], female    DOB: 1980-12-15, 41 y.o.   MRN: [id number]  HPI  Patient seen for left-sided, nonradiating, sharp pelvic pain that started on Saturday night.  Pain is slightly increased with some movements, such as twisting.  Pain is fairly constant and slightly improved from Saturday.  Worst night was Sunday.  Patient just finished her menses on Saturday and had a little bit more bleeding on Sunday.  Her bleeding is now completely stopped.  Review of Systems     Objective:   Physical Exam Vitals and nursing note reviewed. Exam conducted with a chaperone present.  Constitutional:      Appearance: She is well-developed.  Cardiovascular:     Rate and Rhythm: Normal rate.  Pulmonary:     Effort: Pulmonary effort is normal.  Abdominal:     General: Abdomen is flat.     Tenderness: There is abdominal tenderness in the left lower quadrant. There is no right CVA tenderness, left CVA tenderness, guarding or rebound. Negative signs include Murphy's sign and McBurney's sign.     Hernia: No hernia is present. There is no hernia in the left inguinal area or right inguinal area.  Genitourinary:    Labia:        Right: No rash, tenderness or lesion.        Left: No rash, tenderness or lesion.      Vagina: No signs of injury. No vaginal discharge.     Cervix: Dilated. No cervical motion tenderness or friability.     Uterus: Not deviated, not enlarged and not tender.      Adnexa: Right adnexa normal.       Left: Tenderness and fullness present. No mass.       Rectum: Normal.  Lymphadenopathy:     Lower Body: No right inguinal adenopathy. No left inguinal adenopathy.  Skin:    General: Skin is warm and dry.  Neurological:     Mental Status: She is alert.       Assessment & Plan:  1. Left lower quadrant abdominal pain Possible cyst.  UPT negative.  Check ultrasound and cultures.  Recommended ibuprofen 600 mg every 6 hours.  Patient encouraged  to go to ER with sudden increase in pain or changes in symptoms. - Cervicovaginal ancillary only( [place]) - US Pelvis Complete; Future  2. Screening for STD (sexually transmitted disease) - Cervicovaginal ancillary only( [place])

## 2022-01-09 ENCOUNTER — Other Ambulatory Visit: Payer: Self-pay | Admitting: Family Medicine

## 2022-01-09 ENCOUNTER — Other Ambulatory Visit: Payer: Self-pay

## 2022-01-09 ENCOUNTER — Ambulatory Visit (HOSPITAL_BASED_OUTPATIENT_CLINIC_OR_DEPARTMENT_OTHER)
Admission: RE | Admit: 2022-01-09 | Discharge: 2022-01-09 | Disposition: A | Discharge status: Home / Self Care | Payer: Medicaid Other | Source: Ambulatory Visit | Attending: Family Medicine | Admitting: Family Medicine

## 2022-01-09 ENCOUNTER — Encounter: Payer: Self-pay | Admitting: Family Medicine

## 2022-01-09 DIAGNOSIS — R1032 Left lower quadrant pain: Secondary | ICD-10-CM | POA: Diagnosis not present

## 2022-01-10 LAB — CERVICOVAGINAL ANCILLARY ONLY
Bacterial Vaginitis (gardnerella): NEGATIVE
Candida Glabrata: NEGATIVE
Candida Vaginitis: POSITIVE — AB
Chlamydia: NEGATIVE
Comment: NEGATIVE
Comment: NEGATIVE
Comment: NEGATIVE
Comment: NEGATIVE
Comment: NEGATIVE
Comment: NORMAL
Neisseria Gonorrhea: NEGATIVE
Trichomonas: NEGATIVE

## 2022-01-11 ENCOUNTER — Telehealth: Payer: Self-pay

## 2022-01-11 DIAGNOSIS — B379 Candidiasis, unspecified: Secondary | ICD-10-CM

## 2022-01-11 MED ORDER — FLUCONAZOLE 150 MG PO TABS: ORAL_TABLET | ORAL | 1 refills | Status: DC

## 2022-01-11 NOTE — Telephone Encounter (Signed)
Called pt to inform her of positive yeast results.Diflucan 150 mg was sent to her pharmacy. Understanding was voiced. Brittany [name] l Nery Kalisz, CMA

## 2022-01-13 ENCOUNTER — Ambulatory Visit: Payer: Medicaid Other | Admitting: Obstetrics and Gynecology

## 2022-01-13 DIAGNOSIS — Z3202 Encounter for pregnancy test, result negative: Secondary | ICD-10-CM | POA: Diagnosis not present

## 2022-01-23 MED ORDER — FLUCONAZOLE 150 MG PO TABS: 150.0000 mg | ORAL_TABLET | Freq: Once | ORAL | 3 refills | Status: AC

## 2022-03-24 DIAGNOSIS — Z79899 Other long term (current) drug therapy: Secondary | ICD-10-CM | POA: Diagnosis not present

## 2022-03-24 DIAGNOSIS — M329 Systemic lupus erythematosus, unspecified: Secondary | ICD-10-CM | POA: Diagnosis not present

## 2022-03-24 DIAGNOSIS — D709 Neutropenia, unspecified: Secondary | ICD-10-CM | POA: Diagnosis not present

## 2022-04-20 DIAGNOSIS — M329 Systemic lupus erythematosus, unspecified: Secondary | ICD-10-CM | POA: Diagnosis not present

## 2022-04-20 DIAGNOSIS — Z79899 Other long term (current) drug therapy: Secondary | ICD-10-CM | POA: Diagnosis not present

## 2022-07-05 DIAGNOSIS — L089 Local infection of the skin and subcutaneous tissue, unspecified: Secondary | ICD-10-CM | POA: Diagnosis not present

## 2022-07-06 ENCOUNTER — Encounter: Payer: Self-pay | Admitting: General Practice

## 2022-08-18 DIAGNOSIS — Z79899 Other long term (current) drug therapy: Secondary | ICD-10-CM | POA: Diagnosis not present

## 2022-08-18 DIAGNOSIS — M329 Systemic lupus erythematosus, unspecified: Secondary | ICD-10-CM | POA: Diagnosis not present

## 2022-08-18 DIAGNOSIS — D508 Other iron deficiency anemias: Secondary | ICD-10-CM | POA: Diagnosis not present

## 2022-08-18 DIAGNOSIS — E611 Iron deficiency: Secondary | ICD-10-CM | POA: Diagnosis not present

## 2022-08-18 DIAGNOSIS — D72819 Decreased white blood cell count, unspecified: Secondary | ICD-10-CM | POA: Diagnosis not present

## 2022-08-18 DIAGNOSIS — D638 Anemia in other chronic diseases classified elsewhere: Secondary | ICD-10-CM | POA: Diagnosis not present

## 2022-08-18 DIAGNOSIS — D709 Neutropenia, unspecified: Secondary | ICD-10-CM | POA: Diagnosis not present

## 2022-08-18 DIAGNOSIS — D509 Iron deficiency anemia, unspecified: Secondary | ICD-10-CM | POA: Diagnosis not present

## 2022-08-18 DIAGNOSIS — E538 Deficiency of other specified B group vitamins: Secondary | ICD-10-CM | POA: Diagnosis not present

## 2022-09-06 ENCOUNTER — Other Ambulatory Visit (HOSPITAL_BASED_OUTPATIENT_CLINIC_OR_DEPARTMENT_OTHER): Payer: Self-pay | Admitting: Family Medicine

## 2022-09-06 DIAGNOSIS — Z1231 Encounter for screening mammogram for malignant neoplasm of breast: Secondary | ICD-10-CM

## 2022-09-11 ENCOUNTER — Encounter (HOSPITAL_BASED_OUTPATIENT_CLINIC_OR_DEPARTMENT_OTHER): Payer: Self-pay

## 2022-09-11 ENCOUNTER — Ambulatory Visit (HOSPITAL_BASED_OUTPATIENT_CLINIC_OR_DEPARTMENT_OTHER)
Admission: RE | Admit: 2022-09-11 | Discharge: 2022-09-11 | Disposition: A | Discharge status: Home / Self Care | Payer: Medicaid Other | Source: Ambulatory Visit | Attending: Family Medicine | Admitting: Family Medicine

## 2022-09-11 DIAGNOSIS — Z1231 Encounter for screening mammogram for malignant neoplasm of breast: Secondary | ICD-10-CM | POA: Diagnosis not present

## 2022-09-13 ENCOUNTER — Other Ambulatory Visit: Payer: Self-pay | Admitting: Family Medicine

## 2022-09-13 DIAGNOSIS — R928 Other abnormal and inconclusive findings on diagnostic imaging of breast: Secondary | ICD-10-CM

## 2022-09-27 ENCOUNTER — Ambulatory Visit
Admission: RE | Admit: 2022-09-27 | Discharge: 2022-09-27 | Disposition: A | Discharge status: Home / Self Care | Payer: Medicaid Other | Source: Ambulatory Visit | Attending: Family Medicine | Admitting: Family Medicine

## 2022-09-27 ENCOUNTER — Other Ambulatory Visit: Payer: Self-pay | Admitting: Family Medicine

## 2022-09-27 DIAGNOSIS — R921 Mammographic calcification found on diagnostic imaging of breast: Secondary | ICD-10-CM

## 2022-09-27 DIAGNOSIS — R928 Other abnormal and inconclusive findings on diagnostic imaging of breast: Secondary | ICD-10-CM

## 2022-10-02 DIAGNOSIS — M329 Systemic lupus erythematosus, unspecified: Secondary | ICD-10-CM | POA: Diagnosis not present

## 2022-10-02 DIAGNOSIS — D709 Neutropenia, unspecified: Secondary | ICD-10-CM | POA: Diagnosis not present

## 2022-10-02 DIAGNOSIS — Z79899 Other long term (current) drug therapy: Secondary | ICD-10-CM | POA: Diagnosis not present

## 2022-10-04 ENCOUNTER — Other Ambulatory Visit: Payer: Self-pay | Admitting: Family Medicine

## 2022-10-04 DIAGNOSIS — R921 Mammographic calcification found on diagnostic imaging of breast: Secondary | ICD-10-CM

## 2022-10-09 ENCOUNTER — Ambulatory Visit
Admission: RE | Admit: 2022-10-09 | Discharge: 2022-10-09 | Disposition: A | Discharge status: Home / Self Care | Payer: Medicaid Other | Source: Ambulatory Visit | Attending: Family Medicine | Admitting: Family Medicine

## 2022-10-09 DIAGNOSIS — R921 Mammographic calcification found on diagnostic imaging of breast: Secondary | ICD-10-CM

## 2022-10-09 DIAGNOSIS — N6021 Fibroadenosis of right breast: Secondary | ICD-10-CM | POA: Diagnosis not present

## 2022-10-09 HISTORY — PX: BREAST BIOPSY: SHX20

## 2022-10-10 ENCOUNTER — Other Ambulatory Visit: Payer: Self-pay | Admitting: Family Medicine

## 2022-10-10 DIAGNOSIS — R928 Other abnormal and inconclusive findings on diagnostic imaging of breast: Secondary | ICD-10-CM

## 2022-10-11 ENCOUNTER — Encounter: Payer: Self-pay | Admitting: General Practice

## 2022-10-18 ENCOUNTER — Ambulatory Visit (INDEPENDENT_AMBULATORY_CARE_PROVIDER_SITE_OTHER): Payer: Medicaid Other | Admitting: Family Medicine

## 2022-10-18 ENCOUNTER — Other Ambulatory Visit (HOSPITAL_COMMUNITY)
Admission: RE | Admit: 2022-10-18 | Discharge: 2022-10-18 | Disposition: A | Discharge status: Home / Self Care | Payer: Medicaid Other | Source: Ambulatory Visit | Attending: Family Medicine | Admitting: Family Medicine

## 2022-10-18 ENCOUNTER — Encounter: Payer: Self-pay | Admitting: Family Medicine

## 2022-10-18 VITALS — BP 115/70 | HR 67 | Ht 68.0 in | Wt 186.0 lb

## 2022-10-18 DIAGNOSIS — R87619 Unspecified abnormal cytological findings in specimens from cervix uteri: Secondary | ICD-10-CM | POA: Diagnosis not present

## 2022-10-18 DIAGNOSIS — Z01419 Encounter for gynecological examination (general) (routine) without abnormal findings: Secondary | ICD-10-CM | POA: Diagnosis not present

## 2022-10-18 DIAGNOSIS — L732 Hidradenitis suppurativa: Secondary | ICD-10-CM

## 2022-10-18 NOTE — Progress Notes (Signed)
   ANNUAL EXAM Patient name: Brittany [name] MRN [id number]  Date of birth: 1981-06-05 Chief Complaint:   Annual Exam  History of Present Illness:   Brittany [name] is a 41 y.o.  [phone]  female  being seen today for a routine annual exam.  Current complaints: perimenopausal symptoms - occasionally skips month. No contraceptive use - is okay with pregnancy  Patient's last menstrual period was 10/08/2022 (approximate).    Last pap 2021 ASCUS/+HPV, 2022 +HPV.  Last mammogram: 09/2022 - had biopsy which showed sclerosis adenosis. Has repeat biopsy 10/2021 Last colonoscopy: n/a      No data to display               No data to display           Review of Systems:   Pertinent items are noted in HPI Denies any headaches, blurred vision, fatigue, shortness of breath, chest pain, abdominal pain, abnormal vaginal discharge/itching/odor/irritation, problems with periods, bowel movements, urination, or intercourse unless otherwise stated above. Pertinent History Reviewed:  Reviewed past medical,surgical, social and family history.  Reviewed problem list, medications and allergies. Physical Assessment:   Vitals:   10/18/22 0916  BP: 115/70  Pulse: 67  Weight: 186 lb (84.4 kg)  Height: 5\' 8"  (1.727 m)  Body mass index is 28.28 kg/m.        Physical Examination:   General appearance - well appearing, and in no distress  Mental status - alert, oriented to person, place, and time  Psych:  She has a normal mood and affect  Skin - warm and dry, normal color, no suspicious lesions noted. Stage 2 hidradenitis in axilla bilaterally.  Chest - effort normal, all lung fields clear to auscultation bilaterally  Heart - normal rate and regular rhythm  Neck:  midline trachea, no thyromegaly or nodules  Breasts - breasts appear normal, no suspicious masses, no skin or nipple changes or axillary nodes  Abdomen - soft, nontender, nondistended, no masses or  organomegaly  Pelvic - VULVA: normal appearing vulva with no masses, tenderness or lesions  VAGINA: normal appearing vagina with normal color and discharge, no lesions  CERVIX: normal appearing cervix without discharge or lesions, no CMT  Thin prep pap is done with HR HPV cotesting  UTERUS: uterus is felt to be normal size, shape, consistency and nontender   ADNEXA: No adnexal masses or tenderness noted.  Extremities:  No swelling or varicosities noted  Chaperone present for exam  Assessment & Plan:  1. Well woman exam with routine gynecological exam - Cytology - PAP( [place]) - HIV antibody (with reflex) - RPR - Hepatitis C Antibody - Hepatitis B Surface AntiGEN  2. Hydradenitis  3. Abnormal cervical Papanicolaou smear, unspecified abnormal pap finding - Cytology - PAP( [place])   Labs/procedures today: PAP with STI screening.  No orders of the defined types were placed in this encounter.   Meds: No orders of the defined types were placed in this encounter.   Follow-up: No follow-ups on file.  , DO 10/18/2022 10:04 AM

## 2022-10-19 LAB — HIV ANTIBODY (ROUTINE TESTING W REFLEX): HIV Screen 4th Generation wRfx: NONREACTIVE

## 2022-10-19 LAB — HEPATITIS B SURFACE ANTIGEN: Hepatitis B Surface Ag: NEGATIVE

## 2022-10-19 LAB — RPR: RPR Ser Ql: NONREACTIVE

## 2022-10-19 LAB — HEPATITIS C ANTIBODY: Hep C Virus Ab: NONREACTIVE

## 2022-10-20 DIAGNOSIS — E042 Nontoxic multinodular goiter: Secondary | ICD-10-CM | POA: Diagnosis not present

## 2022-10-23 LAB — CYTOLOGY - PAP
Chlamydia: NEGATIVE
Comment: NEGATIVE
Comment: NEGATIVE
Comment: NEGATIVE
Comment: NORMAL
Diagnosis: NEGATIVE
High risk HPV: NEGATIVE
Neisseria Gonorrhea: NEGATIVE
Trichomonas: NEGATIVE

## 2022-10-25 ENCOUNTER — Ambulatory Visit
Admission: RE | Admit: 2022-10-25 | Discharge: 2022-10-25 | Disposition: A | Discharge status: Home / Self Care | Payer: Medicaid Other | Source: Ambulatory Visit | Attending: Family Medicine | Admitting: Family Medicine

## 2022-10-25 DIAGNOSIS — R928 Other abnormal and inconclusive findings on diagnostic imaging of breast: Secondary | ICD-10-CM

## 2022-10-25 DIAGNOSIS — R921 Mammographic calcification found on diagnostic imaging of breast: Secondary | ICD-10-CM | POA: Diagnosis not present

## 2022-10-25 DIAGNOSIS — N6011 Diffuse cystic mastopathy of right breast: Secondary | ICD-10-CM | POA: Diagnosis not present

## 2022-10-25 HISTORY — PX: BREAST BIOPSY: SHX20

## 2022-11-07 DIAGNOSIS — E042 Nontoxic multinodular goiter: Secondary | ICD-10-CM | POA: Diagnosis not present

## 2022-11-07 DIAGNOSIS — E041 Nontoxic single thyroid nodule: Secondary | ICD-10-CM | POA: Diagnosis not present

## 2022-11-29 DIAGNOSIS — D508 Other iron deficiency anemias: Secondary | ICD-10-CM | POA: Diagnosis not present

## 2022-12-30 IMAGING — US US PELVIS COMPLETE WITH TRANSVAGINAL
1 series · 14 of 25 positions shown · non-contrast
Comparison: None

CLINICAL DATA: Left lower quadrant pain



[Series 1: us pelvis complete with transvaginal · 14 of 79 slices shown]
[im 1/79]
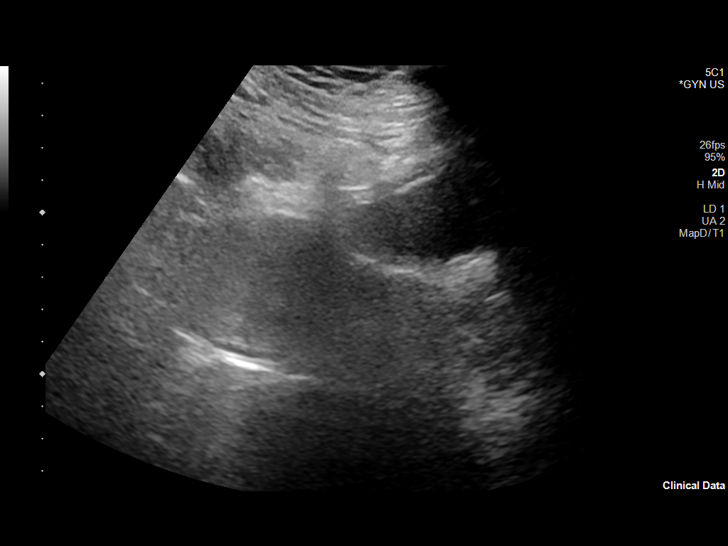
[im 7/79]
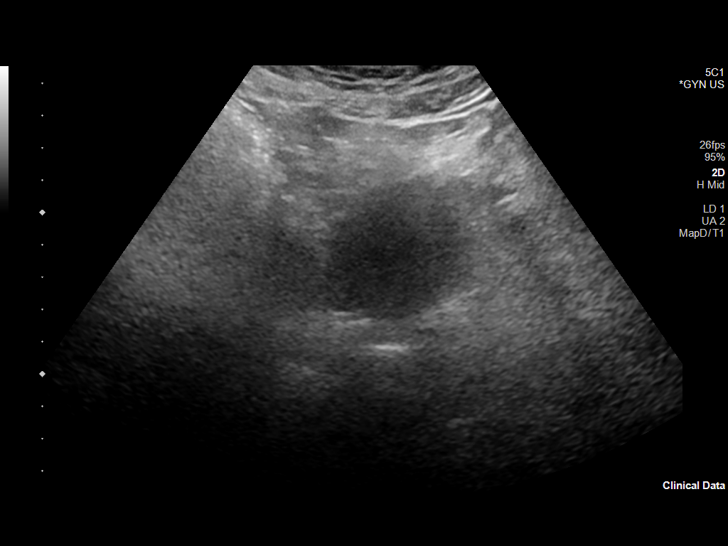
[im 14/79]
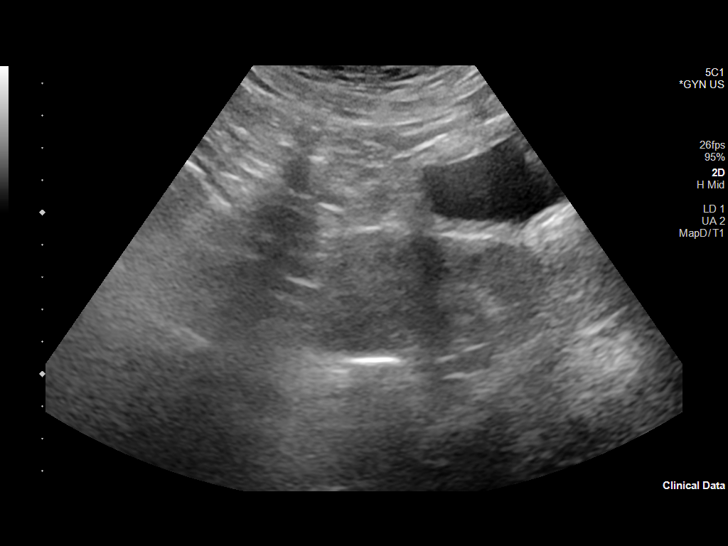
[im 20/79]
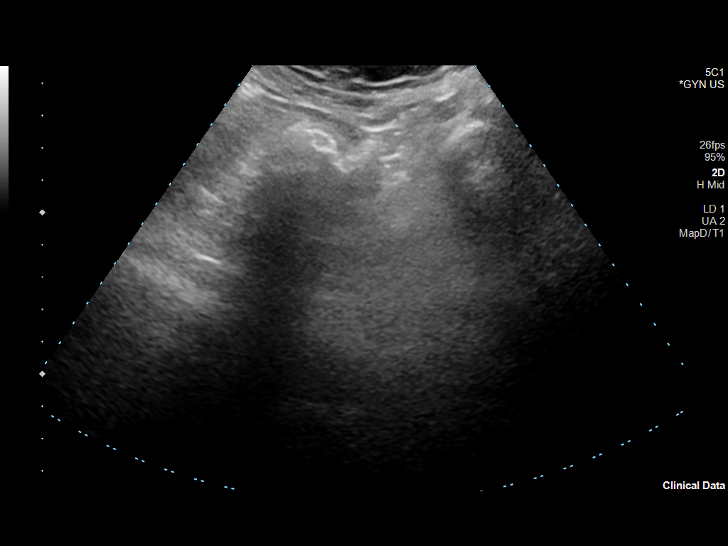
[im 27/79]
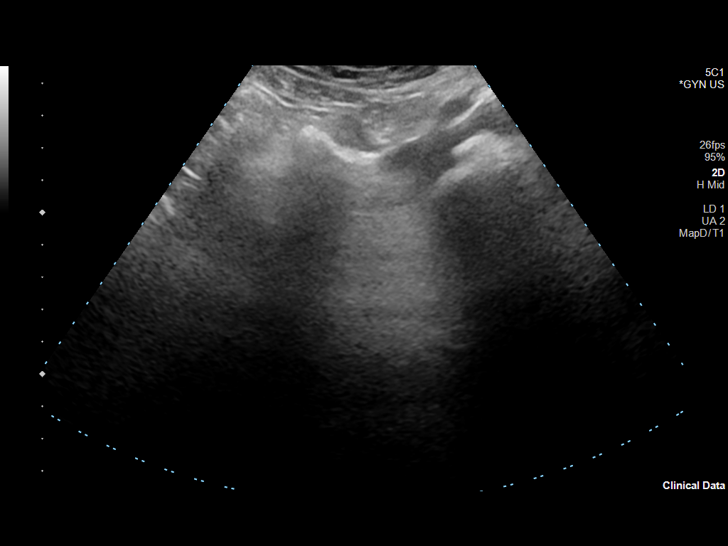
[im 30/79]
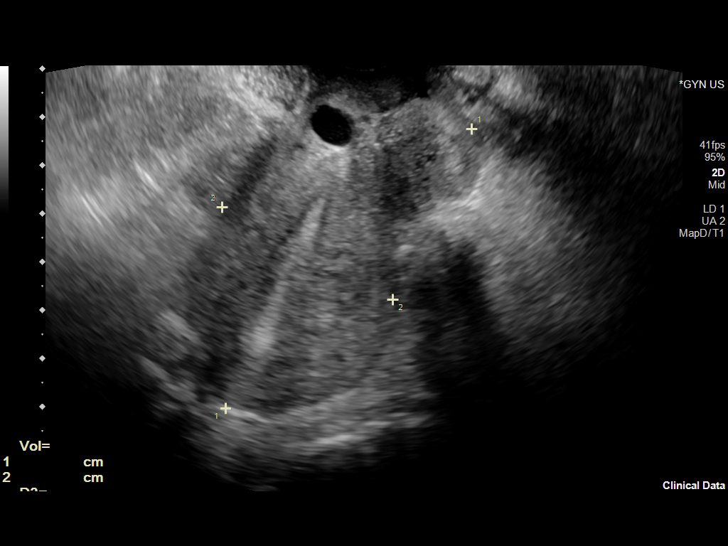
[im 36/79]
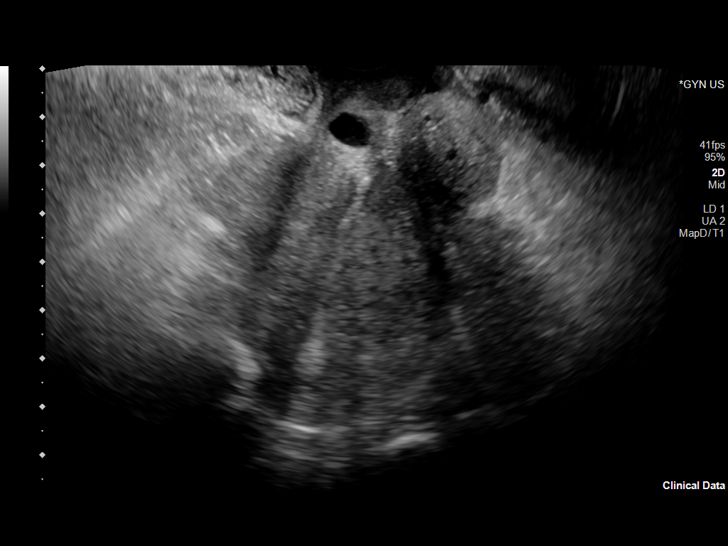
[im 43/79]
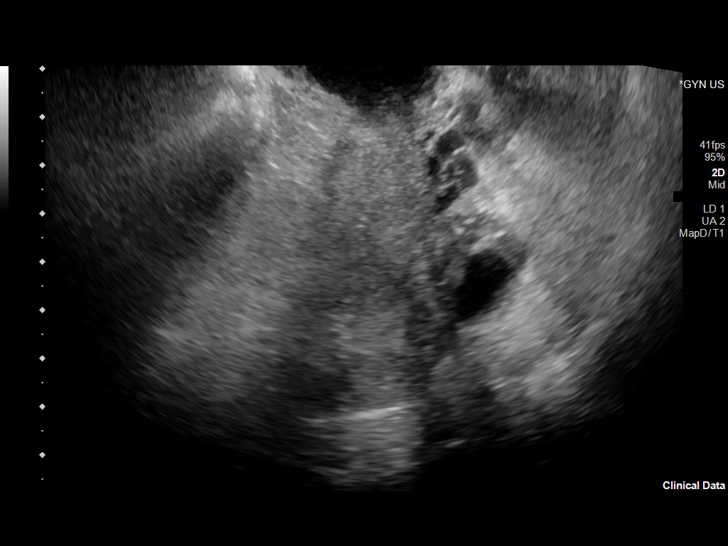
[im 49/79]
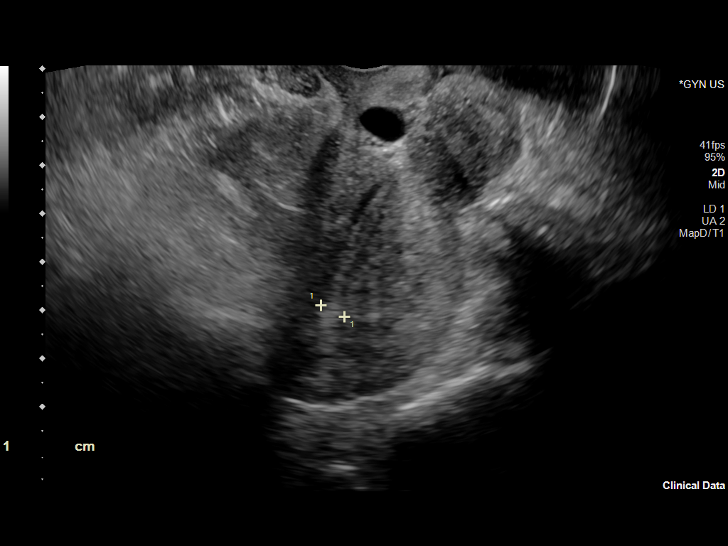
[im 53/79]
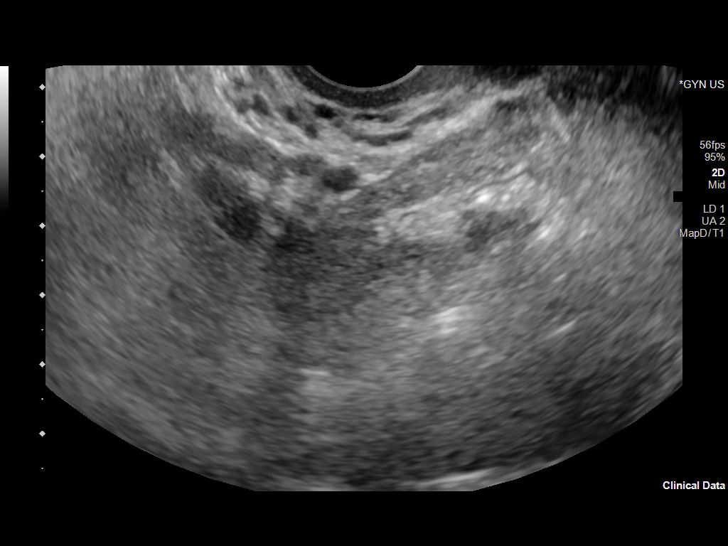
[im 59/79]
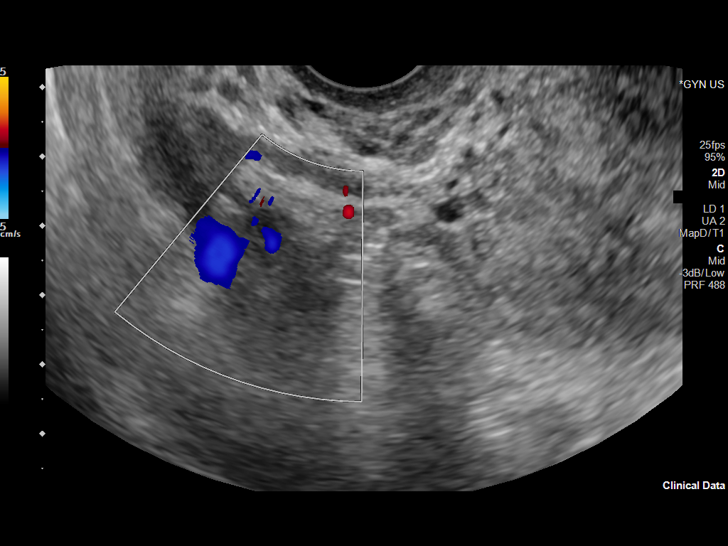
[im 66/79]
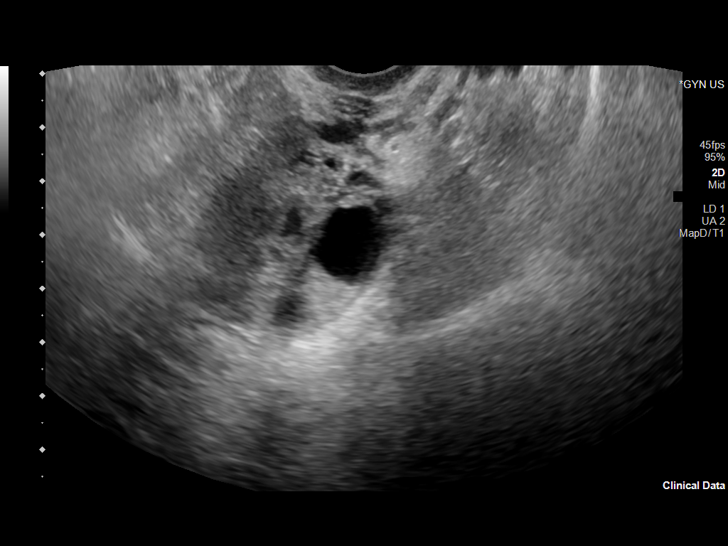
[im 72/79]
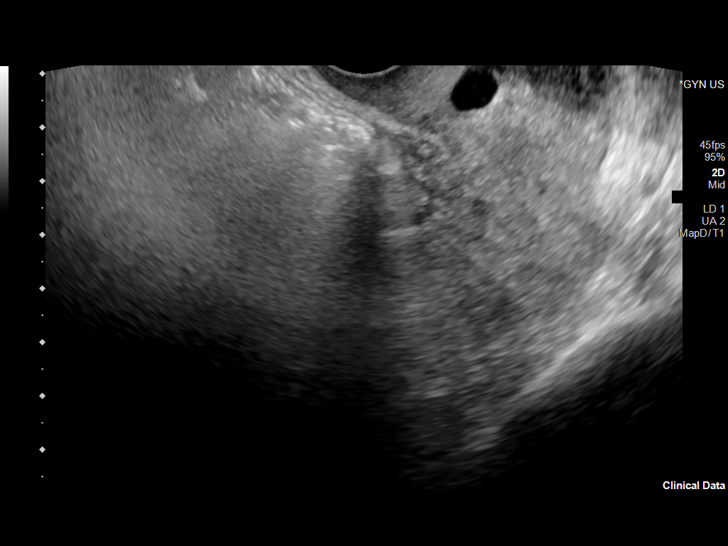
[im 79/79]
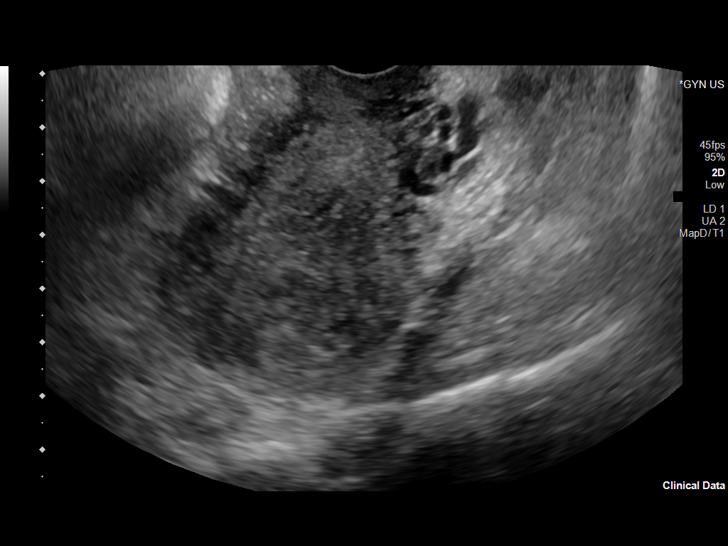

[14 of 25 positions shown; findings below may reference images not displayed]

FINDINGS: Uterus

Measurements: 7.7 x 4.0 x 5.0 cm = volume: 81.6 mL. No fibroids or
other mass visualized.

Endometrium

Thickness: 5 mm.  No focal abnormality visualized.

Right ovary

Measurements: 2.1 x 1.0 x 1.6 cm = volume: 1.8 mL. Normal
appearance.

Left ovary

Measurements: 3.3 x 2.0 x 1.7 cm = volume: 5.8 mL. Normal
appearance.

Other findings

No abnormal free fluid.
IMPRESSION: Normal sonographic evaluation of the uterus and ovaries.

## 2023-01-17 DIAGNOSIS — R0981 Nasal congestion: Secondary | ICD-10-CM | POA: Diagnosis not present

## 2023-01-17 DIAGNOSIS — R051 Acute cough: Secondary | ICD-10-CM | POA: Diagnosis not present

## 2023-01-17 DIAGNOSIS — U071 COVID-19: Secondary | ICD-10-CM | POA: Diagnosis not present

## 2023-02-19 DIAGNOSIS — D509 Iron deficiency anemia, unspecified: Secondary | ICD-10-CM | POA: Diagnosis not present

## 2023-05-25 DIAGNOSIS — D509 Iron deficiency anemia, unspecified: Secondary | ICD-10-CM | POA: Diagnosis not present

## 2023-07-23 DIAGNOSIS — D638 Anemia in other chronic diseases classified elsewhere: Secondary | ICD-10-CM | POA: Diagnosis not present

## 2023-07-23 DIAGNOSIS — D709 Neutropenia, unspecified: Secondary | ICD-10-CM | POA: Diagnosis not present

## 2023-07-23 DIAGNOSIS — M328 Other forms of systemic lupus erythematosus: Secondary | ICD-10-CM | POA: Diagnosis not present

## 2023-07-23 DIAGNOSIS — Z79899 Other long term (current) drug therapy: Secondary | ICD-10-CM | POA: Diagnosis not present

## 2023-08-02 DIAGNOSIS — M328 Other forms of systemic lupus erythematosus: Secondary | ICD-10-CM | POA: Diagnosis not present

## 2023-08-02 DIAGNOSIS — D638 Anemia in other chronic diseases classified elsewhere: Secondary | ICD-10-CM | POA: Diagnosis not present

## 2023-08-02 DIAGNOSIS — M329 Systemic lupus erythematosus, unspecified: Secondary | ICD-10-CM | POA: Diagnosis not present

## 2023-08-02 DIAGNOSIS — D709 Neutropenia, unspecified: Secondary | ICD-10-CM | POA: Diagnosis not present

## 2023-08-02 DIAGNOSIS — Z79899 Other long term (current) drug therapy: Secondary | ICD-10-CM | POA: Diagnosis not present

## 2023-09-10 DIAGNOSIS — E538 Deficiency of other specified B group vitamins: Secondary | ICD-10-CM | POA: Diagnosis not present

## 2023-09-10 DIAGNOSIS — D709 Neutropenia, unspecified: Secondary | ICD-10-CM | POA: Diagnosis not present

## 2023-09-10 DIAGNOSIS — D509 Iron deficiency anemia, unspecified: Secondary | ICD-10-CM | POA: Diagnosis not present

## 2023-09-10 DIAGNOSIS — D500 Iron deficiency anemia secondary to blood loss (chronic): Secondary | ICD-10-CM | POA: Diagnosis not present

## 2023-10-15 ENCOUNTER — Other Ambulatory Visit: Payer: Self-pay | Admitting: Family Medicine

## 2023-10-15 DIAGNOSIS — Z1231 Encounter for screening mammogram for malignant neoplasm of breast: Secondary | ICD-10-CM

## 2023-10-16 ENCOUNTER — Other Ambulatory Visit: Payer: Self-pay

## 2023-10-16 DIAGNOSIS — N898 Other specified noninflammatory disorders of vagina: Secondary | ICD-10-CM

## 2023-10-16 MED ORDER — METRONIDAZOLE 500 MG PO TABS: 500.0000 mg | ORAL_TABLET | Freq: Two times a day (BID) | ORAL | 0 refills | Status: AC

## 2023-10-16 NOTE — Progress Notes (Signed)
Patient called stating she is experiencing vaginal odor and discharge. Patient took an over the counter monistat but symptoms did not resolve. Informed patient, I can treat her per protocol with Flagyl but if symptoms do not resolve then patient will need to come in for a self swab. Patient voices understanding and rx sent.

## 2023-11-12 ENCOUNTER — Ambulatory Visit: Payer: Medicaid Other

## 2023-11-12 DIAGNOSIS — D709 Neutropenia, unspecified: Secondary | ICD-10-CM | POA: Diagnosis not present

## 2023-11-12 DIAGNOSIS — Z79899 Other long term (current) drug therapy: Secondary | ICD-10-CM | POA: Diagnosis not present

## 2023-11-12 DIAGNOSIS — M329 Systemic lupus erythematosus, unspecified: Secondary | ICD-10-CM | POA: Diagnosis not present

## 2023-11-15 ENCOUNTER — Ambulatory Visit (INDEPENDENT_AMBULATORY_CARE_PROVIDER_SITE_OTHER): Payer: Medicaid Other | Admitting: Family Medicine

## 2023-11-15 ENCOUNTER — Encounter: Payer: Self-pay | Admitting: Family Medicine

## 2023-11-15 ENCOUNTER — Other Ambulatory Visit (HOSPITAL_COMMUNITY)
Admission: RE | Admit: 2023-11-15 | Discharge: 2023-11-15 | Disposition: A | Discharge status: Home / Self Care | Payer: Medicaid Other | Source: Ambulatory Visit | Attending: Family Medicine | Admitting: Family Medicine

## 2023-11-15 VITALS — BP 127/73 | HR 86 | Ht 68.0 in | Wt 199.0 lb

## 2023-11-15 DIAGNOSIS — R87619 Unspecified abnormal cytological findings in specimens from cervix uteri: Secondary | ICD-10-CM

## 2023-11-15 DIAGNOSIS — N841 Polyp of cervix uteri: Secondary | ICD-10-CM | POA: Diagnosis not present

## 2023-11-15 DIAGNOSIS — Z01419 Encounter for gynecological examination (general) (routine) without abnormal findings: Secondary | ICD-10-CM | POA: Insufficient documentation

## 2023-11-15 DIAGNOSIS — Z124 Encounter for screening for malignant neoplasm of cervix: Secondary | ICD-10-CM | POA: Insufficient documentation

## 2023-11-15 DIAGNOSIS — Z113 Encounter for screening for infections with a predominantly sexual mode of transmission: Secondary | ICD-10-CM

## 2023-11-15 NOTE — Progress Notes (Signed)
ANNUAL EXAM Patient name: Brittany [name] MRN [id number]  Date of birth: 02-25-1981 Chief Complaint:   Annual Exam  History of Present Illness:   Brittany [name] is a 42 y.o.  [phone]  female  being seen today for a routine annual exam.  Current complaints: none, some vaginal dryness.  No LMP recorded (lmp unknown).    Last pap 2023. Results were: NILM w/ HRHPV negative. H/O abnormal pap: yes  2021 - ASCUS +HPV 2022 - Normal cytology, + HPV 2023 - normal, hpv neg Last mammogram: abnormal mammogram and [place], biopsy - fibrocystic change.      No data to display               No data to display           Review of Systems:   Pertinent items are noted in HPI Denies any headaches, blurred vision, fatigue, shortness of breath, chest pain, abdominal pain, abnormal vaginal discharge/itching/odor/irritation, problems with periods, bowel movements, urination, or intercourse unless otherwise stated above. Pertinent History Reviewed:  Reviewed past medical,surgical, social and family history.  Reviewed problem list, medications and allergies. Physical Assessment:   Vitals:   11/15/23 1611  BP: 127/73  Pulse: 86  Weight: 199 lb (90.3 kg)  Height: 5\' 8"  (1.727 m)  Body mass index is 30.26 kg/m.        Physical Examination:   General appearance - well appearing, and in no distress  Mental status - alert, oriented to person, place, and time  Psych:  She has a normal mood and affect  Skin - warm and dry, normal color, no suspicious lesions noted  Chest - effort normal, all lung fields clear to auscultation bilaterally  Heart - normal rate and regular rhythm  Neck:  midline trachea, no thyromegaly or nodules  Breasts - breasts appear normal, no suspicious masses, no skin or nipple changes or axillary nodes  Abdomen - soft, nontender, nondistended, no masses or organomegaly  Pelvic - VULVA: normal appearing vulva with no masses, tenderness or lesions   VAGINA: normal appearing vagina with normal color and discharge, no lesions  CERVIX: normal appearing cervix without discharge. 1cm cervical polyp seen, no CMT  Thin prep pap is done with HR HPV cotesting   UTERUS: uterus is felt to be normal size, shape, consistency and nontender   ADNEXA: No adnexal masses or tenderness noted.  Extremities:  No swelling or varicosities noted  Chaperone present for exam  Assessment & Plan:  1. Well woman exam with routine gynecological exam ([place]) - Cytology - PAP  2. Abnormal cervical Papanicolaou smear, unspecified abnormal pap finding PAP today  3. Cervical polyp CERVICAL POLYPECTOMY NOTE Risks of the biopsy including pain, bleeding, infection, inadequate specimen, and need for additional procedures were discussed. The patient stated understanding and agreed to undergo procedure today. Ring forceps were used to grasp the polypoid lesion and the polypoid lesions was removed by twisting it off its base.  Small bleeding was noted and hemostasis was achieved using silver nitrate sticks.  The patient tolerated the procedure well. Post-procedure instructions were given to the patient. The patient is to call with heavy bleeding, fever greater than 100.4, foul smelling vaginal discharge or other concerns. The patient will be return to clinic in two weeks for discussion of results.   4. Screening for STDs (sexually transmitted diseases) - Hepatitis B surface antigen; Future - Hepatitis C antibody; Future - HIV Antibody (routine testing w rflx); Future - RPR;  Future   Labs/procedures today:   No orders of the defined types were placed in this encounter.   Meds: No orders of the defined types were placed in this encounter.   Follow-up: No follow-ups on file.  Levie Heritage, DO 11/15/2023 4:54 PM

## 2023-11-19 ENCOUNTER — Ambulatory Visit: Payer: Medicaid Other

## 2023-11-19 LAB — SURGICAL PATHOLOGY

## 2023-11-22 LAB — CYTOLOGY - PAP
Chlamydia: NEGATIVE
Comment: NEGATIVE
Comment: NEGATIVE
Comment: NEGATIVE
Comment: NORMAL
Diagnosis: NEGATIVE
High risk HPV: NEGATIVE
Neisseria Gonorrhea: NEGATIVE
Trichomonas: NEGATIVE

## 2023-12-04 DIAGNOSIS — Z3201 Encounter for pregnancy test, result positive: Secondary | ICD-10-CM | POA: Diagnosis not present

## 2023-12-05 NOTE — L&D Delivery Note (Signed)
 OB/GYN Faculty Practice Delivery Note  Brittany [name] is a 43 y.o. [id number] s/p SVD at [redacted]w[redacted]d. She was admitted for IOL for PEC w SF (HA).   ROM: 5h 50m with clear fluid GBS Status: Negative Maximum Maternal Temperature: 98.3  Labor Progress: IOL started with dual cytotec followed by AROM and Pitocin  titration. Pt then progressed to complete with vaginal pressure.  Delivery Date/Time: 04/07/24 at 1142 Delivery: Called to room and patient was complete and ready to push. NICU team called to bedside for late preterm GA and magnesium infusion. Head delivered ROA. No nuchal cord present. Shoulder and body delivered in usual fashion. Infant with spontaneous cry, placed on mother's abdomen, dried and stimulated. Cord clamped x 2 after 1-minute delay, and cut by FOB then baby handed to NICU team. Cord blood drawn and cord sample collected. Placenta delivered spontaneously, intact, with 3-vessel cord. Fundus firm with massage and Pitocin  but did not remain firm, so TXA run. Swept LUS and gave 800mcg rectal cytotec, then fundus remained firm. Labia, perineum, vagina, and cervix inspected, no laceration found.  Placenta: intact, spontaneous, sent to L&D Complications: none Lacerations: none EBL: Analgesia: epidural  Postpartum Planning [x]  transfer orders to MB [x]  discharge summary started & shared [x]  message to sent to schedule follow-up  [x]  lists updated  Infant: Brittany [name])  APGARs 7/9  2370g  Lemuel Quaker, CNM, IBCLC Certified Nurse Midwife, [facility] for [employer], [facility] 04/07/2024, 12:46 PM

## 2023-12-06 ENCOUNTER — Other Ambulatory Visit: Payer: Self-pay

## 2023-12-06 DIAGNOSIS — Z3490 Encounter for supervision of normal pregnancy, unspecified, unspecified trimester: Secondary | ICD-10-CM

## 2023-12-07 ENCOUNTER — Telehealth: Payer: Self-pay

## 2023-12-07 NOTE — Telephone Encounter (Signed)
 Spoke with patient and scheduled her dating [place]. Patient informed that we will schedule new ob appointments once we know how far along she is.

## 2023-12-08 ENCOUNTER — Ambulatory Visit (HOSPITAL_BASED_OUTPATIENT_CLINIC_OR_DEPARTMENT_OTHER): Payer: Medicaid Other

## 2023-12-10 ENCOUNTER — Other Ambulatory Visit: Payer: Self-pay

## 2023-12-10 ENCOUNTER — Encounter (HOSPITAL_BASED_OUTPATIENT_CLINIC_OR_DEPARTMENT_OTHER): Payer: Self-pay | Admitting: Family Medicine

## 2023-12-10 ENCOUNTER — Other Ambulatory Visit: Payer: Self-pay | Admitting: Family Medicine

## 2023-12-10 DIAGNOSIS — Z3490 Encounter for supervision of normal pregnancy, unspecified, unspecified trimester: Secondary | ICD-10-CM

## 2023-12-11 ENCOUNTER — Other Ambulatory Visit: Payer: Self-pay

## 2023-12-12 ENCOUNTER — Ambulatory Visit: Payer: Medicaid Other

## 2023-12-14 ENCOUNTER — Encounter (HOSPITAL_BASED_OUTPATIENT_CLINIC_OR_DEPARTMENT_OTHER): Payer: Self-pay | Admitting: Radiology

## 2023-12-14 ENCOUNTER — Ambulatory Visit (HOSPITAL_BASED_OUTPATIENT_CLINIC_OR_DEPARTMENT_OTHER): Payer: Medicaid Other

## 2023-12-14 ENCOUNTER — Ambulatory Visit (HOSPITAL_BASED_OUTPATIENT_CLINIC_OR_DEPARTMENT_OTHER)
Admission: RE | Admit: 2023-12-14 | Discharge: 2023-12-14 | Disposition: A | Discharge status: Home / Self Care | Payer: Medicaid Other | Source: Ambulatory Visit | Attending: Family Medicine | Admitting: Family Medicine

## 2023-12-14 ENCOUNTER — Other Ambulatory Visit: Payer: Self-pay | Admitting: Family Medicine

## 2023-12-14 DIAGNOSIS — O321XX1 Maternal care for breech presentation, fetus 1: Secondary | ICD-10-CM | POA: Diagnosis not present

## 2023-12-14 DIAGNOSIS — Z3490 Encounter for supervision of normal pregnancy, unspecified, unspecified trimester: Secondary | ICD-10-CM

## 2023-12-14 DIAGNOSIS — O321XX0 Maternal care for breech presentation, not applicable or unspecified: Secondary | ICD-10-CM | POA: Insufficient documentation

## 2023-12-14 DIAGNOSIS — O09512 Supervision of elderly primigravida, second trimester: Secondary | ICD-10-CM | POA: Diagnosis not present

## 2023-12-14 DIAGNOSIS — Z3A19 19 weeks gestation of pregnancy: Secondary | ICD-10-CM | POA: Diagnosis not present

## 2023-12-15 ENCOUNTER — Ambulatory Visit (HOSPITAL_BASED_OUTPATIENT_CLINIC_OR_DEPARTMENT_OTHER): Admission: RE | Admit: 2023-12-15 | Payer: Medicaid Other | Source: Ambulatory Visit

## 2023-12-17 ENCOUNTER — Encounter: Payer: Self-pay | Admitting: Family Medicine

## 2023-12-18 ENCOUNTER — Telehealth: Payer: Medicaid Other

## 2023-12-18 DIAGNOSIS — Z3A19 19 weeks gestation of pregnancy: Secondary | ICD-10-CM | POA: Diagnosis not present

## 2023-12-18 DIAGNOSIS — O09892 Supervision of other high risk pregnancies, second trimester: Secondary | ICD-10-CM | POA: Diagnosis not present

## 2023-12-18 DIAGNOSIS — O09899 Supervision of other high risk pregnancies, unspecified trimester: Secondary | ICD-10-CM | POA: Insufficient documentation

## 2023-12-18 NOTE — Progress Notes (Addendum)
 New OB Intake  I connected with Brittany [name]  on 12/18/23 at  1:30 PM EST by MyChart Video Visit and verified that I am speaking with the correct person using two identifiers. Nurse is located at [facility] [employer] [facility] and pt is located at [address]  I discussed the limitations, risks, security and privacy concerns of performing an evaluation and management service by telephone and the availability of in person appointments. I also discussed with the patient that there may be a patient responsible charge related to this service. The patient expressed understanding and agreed to proceed.  I explained I am completing New OB Intake today. We discussed EDD of 05/07/2024, by Ultrasound. Pt is [id number]. I reviewed her allergies, medications and Medical/Surgical/OB history.    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 Discussed the nature of our practice with multiple providers including residents and students. Due to the size of the practice, the delivering provider may not be the same as those providing prenatal care.   Patient is not interested in water  birth. Offered upcoming OB visit with CNM to discuss further.  MyChart/Babyscripts MyChart access verified. I explained pt will have some visits in office and some virtually. Babyscripts instructions given and order placed. Patient verifies receipt of registration text/e-mail. Account successfully created and app downloaded. If patient is a candidate for Optimized scheduling, add to sticky note.   Blood Pressure Cuff/Weight Scale Patient has a BP cuff at home.Explained  after first prenatal appt pt will check weekly and document in Babyscripts.   Anatomy US  Explained first scheduled US  will be around 19 weeks. Anatomy US  will be scheduled at NOB visit.  Is patient a CenteringPregnancy candidate?  Declined Declined due to  say. Not a candidate due to high risk pregnancy. If accepted,    Is patient a Mom+Baby Combined Care candidate?  Accepted   If accepted, confirm patient does not intend to move from the area for at least 12 months, then notify Mom+Baby staff  Interested in [place]? If yes, send referral and doula dot phrase.   Is patient a candidate for Babyscripts Optimization? No, due to high risk pregnancy.   First visit review I reviewed new OB appt with patient. Explained pt will be seen by Dr.Stinson at first visit. Discussed [place] genetic screening with patient.  Panorama and Horizon.. Routine prenatal labs    Last Pap Diagnosis  Date Value Ref Range Status  11/15/2023   Final   - Negative for intraepithelial lesion or malignancy (NILM)    Brittany [name], CMA 12/18/2023  2:03 PM

## 2023-12-26 ENCOUNTER — Ambulatory Visit: Payer: Medicaid Other | Admitting: Family Medicine

## 2023-12-26 VITALS — BP 107/68 | HR 68 | Wt 197.0 lb

## 2023-12-26 DIAGNOSIS — M3219 Other organ or system involvement in systemic lupus erythematosus: Secondary | ICD-10-CM

## 2023-12-26 DIAGNOSIS — O09899 Supervision of other high risk pregnancies, unspecified trimester: Secondary | ICD-10-CM | POA: Diagnosis not present

## 2023-12-26 DIAGNOSIS — Z3402 Encounter for supervision of normal first pregnancy, second trimester: Secondary | ICD-10-CM

## 2023-12-26 DIAGNOSIS — Z3A21 21 weeks gestation of pregnancy: Secondary | ICD-10-CM

## 2023-12-26 NOTE — Progress Notes (Signed)
Subjective:  Brittany [name] is a [id number] [redacted]w[redacted]d being seen today for her first obstetrical visit.  Her obstetrical history is significant for  two prior vaginal deliveries - second delivery was twins. She does have lupus and is on plaquinel . Patient does intend to breast feed. Pregnancy history fully reviewed.  Patient reports no complaints.  BP 107/68   Pulse 68   Wt 197 lb (89.4 kg)   LMP  (LMP Unknown)   BMI 29.95 kg/m   HISTORY: OB History  Gravida Para Term Preterm AB Living  6 2 1 1 3 3   SAB IAB Ectopic Multiple Live Births  2 1  1 3     # Outcome Date GA Lbr Len/2nd Weight Sex Type Anes PTL Lv  6 Current           5A Preterm 04/30/19 [redacted]w[redacted]d 03:35 / 00:18 6 lb 2.4 oz (2.79 kg) M Vag-Spont EPI  LIV  5B Preterm 04/30/19 [redacted]w[redacted]d 03:35 / 00:33 5 lb 15.2 oz (2.7 kg) M Vag-Spont EPI  LIV  4 Term 2000 [redacted]w[redacted]d   F Vag-Spont EPI N LIV  3 SAB           2 SAB           1 IAB             Past Medical History:  Diagnosis Date   Anemia    Lupus    Sarcoidosis    Urticaria     Past Surgical History:  Procedure Laterality Date   BIOPSY THYROID     BREAST BIOPSY Right 10/09/2022   MM RT BREAST BX W LOC DEV 1ST LESION IMAGE BX SPEC STEREO GUIDE 10/09/2022 [facility]   BREAST BIOPSY Right 10/25/2022   MM RT BREAST BX W LOC DEV 1ST LESION IMAGE BX SPEC STEREO GUIDE 10/25/2022 [facility]   DILATION AND CURETTAGE OF UTERUS     DILATION AND CURETTAGE, DIAGNOSTIC / THERAPEUTIC  01/04/2018    Family History  Problem Relation Age of Onset   Hypertension Mother    Anemia Mother    Food Allergy Mother        red dye, tomato, strawberry   Allergic rhinitis Father    Prostate cancer Father    Heart disease Father    Allergy (severe) Father        insect bee stings   Allergic rhinitis Brother    Hypertension Brother    Heart disease Paternal Grandfather      Exam  BP 107/68   Pulse 68   Wt 197 lb (89.4 kg)   LMP  (LMP Unknown)   BMI 29.95 kg/m    Chaperone present during exam  CONSTITUTIONAL: Well-developed, well-nourished female in no acute distress.  HENT:  Normocephalic, atraumatic, External right and left ear normal. Oropharynx is clear and moist EYES: Conjunctivae and EOM are normal. Pupils are equal, round, and reactive to light. No scleral icterus.  NECK: Normal range of motion, supple, no masses.  Normal thyroid.  CARDIOVASCULAR: Normal heart rate noted, regular rhythm RESPIRATORY: Clear to auscultation bilaterally. Effort and breath sounds normal, no problems with respiration noted. BREASTS: Deferred - recent annual exam ABDOMEN: Soft, normal bowel sounds, no distention noted.  No tenderness, rebound or guarding.  PELVIC: recent annual exam MUSCULOSKELETAL: Normal range of motion. No tenderness.  No cyanosis, clubbing, or edema.  2+ distal pulses. SKIN: Skin is warm and dry. No rash noted. Not diaphoretic. No erythema. No pallor. NEUROLOGIC: Alert and  oriented to person, place, and time. Normal reflexes, muscle tone coordination. No cranial nerve deficit noted. PSYCHIATRIC: Normal mood and affect. Normal behavior. Normal judgment and thought content.    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 Encounter for supervision of normal first pregnancy in second trimester (Primary) - AFP TETRA - Hemoglobin A1c - CBC/D/Plt+RPR+Rh+ABO+RubIgG...  2. Supervision of other high risk pregnancy, antepartum Check HgA1c - Cardiolipin antibodies, IgM+IgG - Sjogrens syndrome-A extractable nuclear antibody - Sjogrens syndrome-B  extractable nuclear antibody - Beta-2 glycoprotein antibodies - Lupus anticoagulant panel - [place] MFM OB DETAIL +14 WK; Future - Genetic Screening - Urine Culture - Culture, OB Urine  3. Other systemic lupus erythematosus with other organ involvement (HCC) Continue plaquinel. ASA 162mg  Testing as below - Cardiolipin antibodies, IgM+IgG - Sjogrens syndrome-A extractable nuclear antibody - Sjogrens syndrome-B extractable nuclear antibody - Beta-2 glycoprotein antibodies - Lupus anticoagulant panel - Beta-2 Glycoprotein I Ab,G/M    Initial labs obtained Continue prenatal vitamins Reviewed n/v relief measures and warning s/s to report Reviewed recommended weight gain based on pre-gravid BMI Encouraged well-balanced diet Genetic & carrier screening discussed: requests Panorama, AFP, and Horizon ,  Ultrasound discussed; fetal survey: requested CCNC completed> form faxed if has or is planning to apply for medicaid The nature of [employer] for [employer] with multiple MDs and other Advanced Practice Providers was explained to patient; also emphasized that fellows, residents, and students are part of our team.    Problem list reviewed and updated. 75% of 30 min visit spent on counseling and coordination of care.     Levie Heritage 12/31/2023

## 2023-12-28 LAB — URINE CULTURE: Organism ID, Bacteria: NO GROWTH

## 2023-12-29 LAB — AFP TETRA
DIA Mom Value: 6.32
DIA Value (EIA): 1186.29 pg/mL
DSR (By Age)    1 IN: 27
DSR (Second Trimester) 1 IN: 10
Gestational Age: 21 wk
MSAFP Mom: 1.57
MSAFP: 84.6 ng/mL
MSHCG Mom: 8.5
MSHCG: 168134 m[IU]/mL
Maternal Age At EDD: 44.3 a
Osb Risk: 2270
T18 (By Age): 1:104 {titer}
Test Results:: POSITIVE — AB
Weight: 195 [lb_av]
uE3 Mom: 0.61
uE3 Value: 1.53 ng/mL

## 2023-12-29 LAB — CBC/D/PLT+RPR+RH+ABO+RUBIGG...
Antibody Screen: NEGATIVE
Basophils Absolute: 0 10*3/uL (ref 0.0–0.2)
Basos: 0 %
EOS (ABSOLUTE): 0 10*3/uL (ref 0.0–0.4)
Eos: 1 %
HCV Ab: NONREACTIVE
HIV Screen 4th Generation wRfx: NONREACTIVE
Hematocrit: 26.4 % — ABNORMAL LOW (ref 34.0–46.6)
Hemoglobin: 9.7 g/dL — ABNORMAL LOW (ref 11.1–15.9)
Hepatitis B Surface Ag: NEGATIVE
Immature Grans (Abs): 0 10*3/uL (ref 0.0–0.1)
Immature Granulocytes: 1 %
Lymphocytes Absolute: 0.7 10*3/uL (ref 0.7–3.1)
Lymphs: 26 %
MCH: 33.9 pg — ABNORMAL HIGH (ref 26.6–33.0)
MCHC: 36.7 g/dL — ABNORMAL HIGH (ref 31.5–35.7)
MCV: 92 fL (ref 79–97)
Monocytes Absolute: 0.2 10*3/uL (ref 0.1–0.9)
Monocytes: 8 %
Neutrophils Absolute: 1.9 10*3/uL (ref 1.4–7.0)
Neutrophils: 64 %
Platelets: 249 10*3/uL (ref 150–450)
RBC: 2.86 x10E6/uL — ABNORMAL LOW (ref 3.77–5.28)
RDW: 14.8 % (ref 11.7–15.4)
RPR Ser Ql: NONREACTIVE
Rh Factor: POSITIVE
Rubella Antibodies, IGG: 7.85 {index} (ref >0.99)
WBC: 2.9 10*3/uL — ABNORMAL LOW (ref 3.4–10.8)

## 2023-12-29 LAB — CARDIOLIPIN ANTIBODIES, IGM+IGG
Anticardiolipin IgG: <9 [GPL'U]/mL (ref 0–14)
Anticardiolipin IgM: 10 [MPL'U]/mL (ref 0–12)

## 2023-12-29 LAB — HEMOGLOBIN A1C
Est. average glucose Bld gHb Est-mCnc: 111 mg/dL
Hgb A1c MFr Bld: 5.5 % (ref 4.8–5.6)

## 2023-12-29 LAB — LUPUS ANTICOAGULANT PANEL
Dilute Viper Venom Time: 24.8 s (ref 0.0–47.0)
PTT Lupus Anticoagulant: 30 s (ref 0.0–43.5)

## 2023-12-29 LAB — SJOGRENS SYNDROME-A EXTRACTABLE NUCLEAR ANTIBODY: ENA SSA (RO) Ab: 5.3 AI — ABNORMAL HIGH (ref 0.0–0.9)

## 2023-12-29 LAB — HCV INTERPRETATION

## 2023-12-29 LAB — BETA-2 GLYCOPROTEIN I AB,G/M
Beta-2 Glyco 1 IgM: <9 GPI IgM units (ref 0–32)
Beta-2 Glyco I IgG: <9 GPI IgG units (ref 0–20)

## 2024-01-02 DIAGNOSIS — O280 Abnormal hematological finding on antenatal screening of mother: Secondary | ICD-10-CM | POA: Insufficient documentation

## 2024-01-07 ENCOUNTER — Ambulatory Visit: Payer: Medicaid Other

## 2024-01-10 ENCOUNTER — Ambulatory Visit: Payer: Medicaid Other

## 2024-01-10 ENCOUNTER — Encounter: Payer: Self-pay | Admitting: *Deleted

## 2024-01-10 ENCOUNTER — Ambulatory Visit: Payer: Medicaid Other | Attending: Family Medicine

## 2024-01-10 ENCOUNTER — Other Ambulatory Visit: Payer: Self-pay | Admitting: *Deleted

## 2024-01-10 ENCOUNTER — Ambulatory Visit: Payer: Medicaid Other | Admitting: *Deleted

## 2024-01-10 ENCOUNTER — Other Ambulatory Visit: Payer: Self-pay

## 2024-01-10 VITALS — BP 110/68 | HR 96

## 2024-01-10 DIAGNOSIS — Z3A23 23 weeks gestation of pregnancy: Secondary | ICD-10-CM | POA: Insufficient documentation

## 2024-01-10 DIAGNOSIS — O09899 Supervision of other high risk pregnancies, unspecified trimester: Secondary | ICD-10-CM | POA: Diagnosis not present

## 2024-01-10 DIAGNOSIS — O09522 Supervision of elderly multigravida, second trimester: Secondary | ICD-10-CM | POA: Insufficient documentation

## 2024-01-10 DIAGNOSIS — O280 Abnormal hematological finding on antenatal screening of mother: Secondary | ICD-10-CM

## 2024-01-10 DIAGNOSIS — O99891 Other specified diseases and conditions complicating pregnancy: Secondary | ICD-10-CM

## 2024-01-10 DIAGNOSIS — O09212 Supervision of pregnancy with history of pre-term labor, second trimester: Secondary | ICD-10-CM

## 2024-01-10 DIAGNOSIS — M329 Systemic lupus erythematosus, unspecified: Secondary | ICD-10-CM | POA: Insufficient documentation

## 2024-01-10 DIAGNOSIS — O26892 Other specified pregnancy related conditions, second trimester: Secondary | ICD-10-CM | POA: Diagnosis not present

## 2024-01-10 NOTE — Progress Notes (Signed)
 MFM Consult Note  Brittany [name] is currently at 23 weeks and 1 day.  She was seen due to advanced maternal age (43 years old) and history of lupus and Sjogren syndrome.  The patient reports that she was diagnosed with lupus and Sjogren syndrome about 10 to 15 years ago.  She is currently treated with daily Plaquenil  and prednisone  as needed for flares.  Her last lupus flare occurred about 3 months ago.  She has screened positive for SSA (anti-Ro) antibodies.  Her anticardiolipin antibodies, lupus anticoagulant, and antibeta-2 glycoprotein 1 antibodies were all negative.    She denies any history of a prior thromboembolic event.  She denies any problems in her current pregnancy.    Her quad screen indicated an elevated Down syndrome risk of 1 in 10.  However, her cell free DNA test indicated a low risk for trisomy 21, 18, and 13. A female fetus is predicted.   She was informed that the fetal growth and amniotic fluid level were appropriate for her gestational age.   There were no obvious fetal anomalies noted on today's ultrasound exam.  The limitations of ultrasound in the detection of all anomalies was discussed.    The following were discussed during today's consultation:  Lupus and Sjogren syndrome in pregnancy  The implications and management of lupus and Sjogren syndrome in pregnancy was discussed in detail with the patient today.    She was advised that women with lupus may be at increased risk for having additional flares during pregnancy.    She was advised that an acute lupus flare may be treated with steroids such as prednisone .  Prednisone  may be taken safely during pregnancy if necessary.    She should continue taking Plaquenil  as recommended by her rheumatologist for the duration of her pregnancy.   The increased risk of fetal growth restriction, an indicated preterm delivery, and early onset preeclampsia associated with lupus in pregnancy was discussed.   She  should continue taking a daily baby aspirin for preeclampsia prophylaxis.  We will continue to follow her with monthly growth ultrasounds.    Weekly fetal testing should be started at around 32 weeks.    She should continue close follow-up with her rheumatologist throughout her pregnancy.  The patient was reassured that as she does not have any long-term sequelae or end organ damage related to lupus, I anticipate that she will most likely have a good and successful pregnancy outcome.    Positive SSA antibodies and pregnancy   The increased risk of a fetal congenital heart block due to her positive SSA antibodies was discussed.    The patient was reassured that the fetal heart rate was within normal limits today and that there were no signs of a congenital heart block noted today.    There is some evidence to indicate that Plaquenil  may be protective against a congenital heart block in women who have the SSA antibody.   Due to her SSA antibodies, she was referred to [facility] pediatric cardiology for fetal echocardiogram.    Advanced maternal age and positive quad screen indicating a high risk for Down syndrome   The increased risk of fetal aneuploidy due to advanced maternal age and her positive quad screen was discussed.   She was offered and declined an amniocentesis today for definitive diagnosis of fetal aneuploidy.  She is comfortable with the low risk for Down syndrome indicated by her cell free DNA test.  She will return in 4 weeks for a  follow-up growth scan.  At the end of the consultation, the patient stated that all her questions had been answered to her complete satisfaction.  A total of 60 minutes was spent counseling and coordinating the care for this patient.  Greater than 50% of the time was spent in direct face-to-face contact.  Recommendations for her pregnancy: Continue close follow-up with her rheumatologist Continue Plaquenil  for management of lupus throughout her  pregnancy Continue daily baby aspirin (81 mg) for preeclampsia prophylaxis Monthly growth ultrasounds Weekly fetal testing starting at 32 weeks

## 2024-01-31 ENCOUNTER — Ambulatory Visit (INDEPENDENT_AMBULATORY_CARE_PROVIDER_SITE_OTHER): Payer: Medicaid Other | Admitting: Family Medicine

## 2024-01-31 ENCOUNTER — Ambulatory Visit: Payer: Medicaid Other | Attending: Obstetrics and Gynecology

## 2024-01-31 ENCOUNTER — Ambulatory Visit: Payer: Medicaid Other

## 2024-01-31 VITALS — BP 118/62 | HR 96 | Wt 196.0 lb

## 2024-01-31 VITALS — BP 127/74 | HR 101

## 2024-01-31 DIAGNOSIS — O09522 Supervision of elderly multigravida, second trimester: Secondary | ICD-10-CM | POA: Diagnosis not present

## 2024-01-31 DIAGNOSIS — Z3A26 26 weeks gestation of pregnancy: Secondary | ICD-10-CM

## 2024-01-31 DIAGNOSIS — O09899 Supervision of other high risk pregnancies, unspecified trimester: Secondary | ICD-10-CM

## 2024-01-31 DIAGNOSIS — O99891 Other specified diseases and conditions complicating pregnancy: Secondary | ICD-10-CM

## 2024-01-31 DIAGNOSIS — O09212 Supervision of pregnancy with history of pre-term labor, second trimester: Secondary | ICD-10-CM | POA: Diagnosis not present

## 2024-01-31 DIAGNOSIS — M329 Systemic lupus erythematosus, unspecified: Secondary | ICD-10-CM | POA: Insufficient documentation

## 2024-01-31 DIAGNOSIS — M3219 Other organ or system involvement in systemic lupus erythematosus: Secondary | ICD-10-CM | POA: Diagnosis not present

## 2024-01-31 NOTE — Addendum Note (Signed)
 Addended by: Jyl Heinz on: 01/31/2024 09:02 AM   Modules accepted: Orders

## 2024-01-31 NOTE — Progress Notes (Signed)
   PRENATAL VISIT NOTE  Subjective:  Brittany [name] is a 43 y.o. [id number] at [redacted]w[redacted]d being seen today for ongoing prenatal care.  She is currently monitored for the following issues for this high-risk pregnancy and has Systemic lupus erythematosus (HCC); Sarcoidosis; Supervision of other high risk pregnancy, antepartum; and Abnormal maternal serum screening test-AFP Tetra Pos  DSR 1:27 on their problem list.  Patient reports no bleeding, no contractions, no cramping, and no leaking.  Contractions: Irregular. Vag. Bleeding: None.  Movement: Present. Denies leaking of fluid.   The following portions of the patient's history were reviewed and updated as appropriate: allergies, current medications, past family history, past medical history, past social history, past surgical history and problem list.   Objective:   Vitals:   01/31/24 0835  BP: 118/62  Pulse: 96  Weight: 196 lb (88.9 kg)    Fetal Status: Fetal Heart Rate (bpm): 152   Movement: Present     General:  Alert, oriented and cooperative. Patient is in no acute distress.  Skin: Skin is warm and dry. No rash noted.   Cardiovascular: Normal heart rate noted  Respiratory: Normal respiratory effort, no problems with respiration noted  Abdomen: Soft, gravid, appropriate for gestational age.  Pain/Pressure: Present     Pelvic: Cervical exam deferred        Extremities: Normal range of motion.  Edema: Trace varicose veins appreciated on right lower extremity  Mental Status: Normal mood and affect. Normal behavior. Normal judgment and thought content.   Assessment and Plan:  Pregnancy: [id number] at [redacted]w[redacted]d 1. Supervision of other high risk pregnancy, antepartum FHR and BP appropriate today  2. Other systemic lupus erythematosus with other organ involvement (HCC) Currently on Plaquenil and ASA. Following with rheumatology and MFM.  Seeing MFM today. Continue monitoring  3. [redacted] weeks gestation of pregnancy (Primary) 28-week labs  being collected today  Preterm labor symptoms and general obstetric precautions including but not limited to vaginal bleeding, contractions, leaking of fluid and fetal movement were reviewed in detail with the patient. Please refer to After Visit Summary for other counseling recommendations.   No follow-ups on file.  Future Appointments  Date Time Provider Department Center  01/31/2024 11:15 AM [facility] NURSE [facility]  01/31/2024 11:30 AM [facility] US4 [facility]  02/28/2024  8:15 AM Levie Heritage, DO [facility] None  03/10/2024 10:15 AM Shea Evans, Gillian Scarce, MD [facility] None  03/24/2024  8:15 AM Lorriane Shire, MD [facility] None    Celedonio Savage, MD

## 2024-01-31 NOTE — Progress Notes (Signed)
 ROB: Knots on bilateral legs

## 2024-02-02 LAB — CBC
Hematocrit: 24.5 % — ABNORMAL LOW (ref 34.0–46.6)
Hemoglobin: 8.9 g/dL — ABNORMAL LOW (ref 11.1–15.9)
MCH: 34 pg — ABNORMAL HIGH (ref 26.6–33.0)
MCHC: 36.3 g/dL — ABNORMAL HIGH (ref 31.5–35.7)
MCV: 94 fL (ref 79–97)
Platelets: 229 10*3/uL (ref 150–450)
RBC: 2.62 x10E6/uL — CL (ref 3.77–5.28)
RDW: 14.5 % (ref 11.7–15.4)
WBC: 3 10*3/uL — ABNORMAL LOW (ref 3.4–10.8)

## 2024-02-02 LAB — RPR: RPR Ser Ql: NONREACTIVE

## 2024-02-02 LAB — HIV ANTIBODY (ROUTINE TESTING W REFLEX): HIV Screen 4th Generation wRfx: NONREACTIVE

## 2024-02-04 ENCOUNTER — Other Ambulatory Visit: Payer: Self-pay | Admitting: Obstetrics and Gynecology

## 2024-02-04 ENCOUNTER — Other Ambulatory Visit: Payer: Medicaid Other

## 2024-02-04 DIAGNOSIS — Z3A28 28 weeks gestation of pregnancy: Secondary | ICD-10-CM | POA: Diagnosis not present

## 2024-02-04 DIAGNOSIS — O09523 Supervision of elderly multigravida, third trimester: Secondary | ICD-10-CM | POA: Diagnosis not present

## 2024-02-04 DIAGNOSIS — Z3483 Encounter for supervision of other normal pregnancy, third trimester: Secondary | ICD-10-CM

## 2024-02-04 NOTE — Addendum Note (Signed)
 Addended by: Billey Chang A on: 02/04/2024 08:43 AM   Modules accepted: Orders

## 2024-02-04 NOTE — Progress Notes (Signed)
 Patient here for 2 hr gtt.  Sent to lab. Brittany [name] [employer]

## 2024-02-05 ENCOUNTER — Encounter: Payer: Self-pay | Admitting: Obstetrics and Gynecology

## 2024-02-05 LAB — GLUCOSE TOLERANCE, 2 HOURS W/ 1HR
Glucose, 1 hour: 145 mg/dL (ref 70–179)
Glucose, 2 hour: 105 mg/dL (ref 70–152)
Glucose, Fasting: 91 mg/dL (ref 70–91)

## 2024-02-06 ENCOUNTER — Other Ambulatory Visit: Payer: Self-pay

## 2024-02-06 DIAGNOSIS — O09522 Supervision of elderly multigravida, second trimester: Secondary | ICD-10-CM

## 2024-02-06 DIAGNOSIS — D6862 Lupus anticoagulant syndrome: Secondary | ICD-10-CM | POA: Diagnosis not present

## 2024-02-06 DIAGNOSIS — M329 Systemic lupus erythematosus, unspecified: Secondary | ICD-10-CM

## 2024-02-11 DIAGNOSIS — D709 Neutropenia, unspecified: Secondary | ICD-10-CM | POA: Diagnosis not present

## 2024-02-11 DIAGNOSIS — Z3A29 29 weeks gestation of pregnancy: Secondary | ICD-10-CM | POA: Diagnosis not present

## 2024-02-11 DIAGNOSIS — M329 Systemic lupus erythematosus, unspecified: Secondary | ICD-10-CM | POA: Diagnosis not present

## 2024-02-11 DIAGNOSIS — Z79899 Other long term (current) drug therapy: Secondary | ICD-10-CM | POA: Diagnosis not present

## 2024-02-11 DIAGNOSIS — O99891 Other specified diseases and conditions complicating pregnancy: Secondary | ICD-10-CM | POA: Diagnosis not present

## 2024-02-14 ENCOUNTER — Encounter: Payer: Medicaid Other | Admitting: Family Medicine

## 2024-02-28 ENCOUNTER — Ambulatory Visit: Payer: Medicaid Other | Attending: Obstetrics and Gynecology

## 2024-02-28 ENCOUNTER — Inpatient Hospital Stay (HOSPITAL_COMMUNITY)

## 2024-02-28 ENCOUNTER — Inpatient Hospital Stay (HOSPITAL_COMMUNITY)
Admission: RE | Admit: 2024-02-28 | Discharge: 2024-03-01 | DRG: 832 | Disposition: A | Discharge status: Home / Self Care | Attending: Obstetrics & Gynecology | Admitting: Obstetrics & Gynecology

## 2024-02-28 ENCOUNTER — Ambulatory Visit: Payer: Medicaid Other | Admitting: Family Medicine

## 2024-02-28 ENCOUNTER — Other Ambulatory Visit: Payer: Self-pay

## 2024-02-28 ENCOUNTER — Encounter (HOSPITAL_COMMUNITY): Payer: Self-pay | Admitting: Family Medicine

## 2024-02-28 VITALS — BP 108/69 | HR 101 | Wt 195.0 lb

## 2024-02-28 DIAGNOSIS — R071 Chest pain on breathing: Secondary | ICD-10-CM | POA: Diagnosis not present

## 2024-02-28 DIAGNOSIS — O0993 Supervision of high risk pregnancy, unspecified, third trimester: Secondary | ICD-10-CM

## 2024-02-28 DIAGNOSIS — D6862 Lupus anticoagulant syndrome: Secondary | ICD-10-CM | POA: Diagnosis not present

## 2024-02-28 DIAGNOSIS — O99119 Other diseases of the blood and blood-forming organs and certain disorders involving the immune mechanism complicating pregnancy, unspecified trimester: Principal | ICD-10-CM | POA: Diagnosis present

## 2024-02-28 DIAGNOSIS — Z3A30 30 weeks gestation of pregnancy: Secondary | ICD-10-CM | POA: Diagnosis not present

## 2024-02-28 DIAGNOSIS — O09523 Supervision of elderly multigravida, third trimester: Secondary | ICD-10-CM | POA: Diagnosis not present

## 2024-02-28 DIAGNOSIS — D869 Sarcoidosis, unspecified: Secondary | ICD-10-CM | POA: Diagnosis present

## 2024-02-28 DIAGNOSIS — O09899 Supervision of other high risk pregnancies, unspecified trimester: Secondary | ICD-10-CM | POA: Diagnosis not present

## 2024-02-28 DIAGNOSIS — R0609 Other forms of dyspnea: Secondary | ICD-10-CM

## 2024-02-28 DIAGNOSIS — Z7982 Long term (current) use of aspirin: Secondary | ICD-10-CM | POA: Diagnosis not present

## 2024-02-28 DIAGNOSIS — O99891 Other specified diseases and conditions complicating pregnancy: Secondary | ICD-10-CM

## 2024-02-28 DIAGNOSIS — Z8249 Family history of ischemic heart disease and other diseases of the circulatory system: Secondary | ICD-10-CM | POA: Diagnosis not present

## 2024-02-28 DIAGNOSIS — R0689 Other abnormalities of breathing: Secondary | ICD-10-CM | POA: Diagnosis not present

## 2024-02-28 DIAGNOSIS — Z7952 Long term (current) use of systemic steroids: Secondary | ICD-10-CM

## 2024-02-28 DIAGNOSIS — M329 Systemic lupus erythematosus, unspecified: Secondary | ICD-10-CM | POA: Diagnosis not present

## 2024-02-28 DIAGNOSIS — R079 Chest pain, unspecified: Secondary | ICD-10-CM | POA: Diagnosis present

## 2024-02-28 DIAGNOSIS — M3219 Other organ or system involvement in systemic lupus erythematosus: Secondary | ICD-10-CM | POA: Diagnosis not present

## 2024-02-28 DIAGNOSIS — R21 Rash and other nonspecific skin eruption: Secondary | ICD-10-CM | POA: Diagnosis not present

## 2024-02-28 DIAGNOSIS — O280 Abnormal hematological finding on antenatal screening of mother: Secondary | ICD-10-CM | POA: Diagnosis not present

## 2024-02-28 DIAGNOSIS — O99113 Other diseases of the blood and blood-forming organs and certain disorders involving the immune mechanism complicating pregnancy, third trimester: Secondary | ICD-10-CM | POA: Diagnosis not present

## 2024-02-28 DIAGNOSIS — Z91040 Latex allergy status: Secondary | ICD-10-CM

## 2024-02-28 LAB — URINALYSIS, ROUTINE W REFLEX MICROSCOPIC
Bilirubin Urine: NEGATIVE
Glucose, UA: NEGATIVE mg/dL
Hgb urine dipstick: NEGATIVE
Ketones, ur: NEGATIVE mg/dL
Nitrite: NEGATIVE
Protein, ur: 100 mg/dL — AB
Specific Gravity, Urine: 1.01 (ref 1.005–1.030)
pH: 6 (ref 5.0–8.0)

## 2024-02-28 LAB — COMPREHENSIVE METABOLIC PANEL WITH GFR
ALT: 12 U/L (ref 0–44)
AST: 19 U/L (ref 15–41)
Albumin: 2.2 g/dL — ABNORMAL LOW (ref 3.5–5.0)
Alkaline Phosphatase: 78 U/L (ref 38–126)
Anion gap: 10 (ref 5–15)
BUN: 5 mg/dL — ABNORMAL LOW (ref 6–20)
CO2: 19 mmol/L — ABNORMAL LOW (ref 22–32)
Calcium: 9.1 mg/dL (ref 8.9–10.3)
Chloride: 106 mmol/L (ref 98–111)
Creatinine, Ser: 0.45 mg/dL (ref 0.44–1.00)
GFR, Estimated: >60 mL/min (ref >60)
Glucose, Bld: 83 mg/dL (ref 70–99)
Potassium: 3.6 mmol/L (ref 3.5–5.1)
Sodium: 135 mmol/L (ref 135–145)
Total Bilirubin: 0.6 mg/dL (ref 0.0–1.2)
Total Protein: 6.6 g/dL (ref 6.5–8.1)

## 2024-02-28 LAB — CBC
HCT: 28.6 % — ABNORMAL LOW (ref 36.0–46.0)
Hemoglobin: 10 g/dL — ABNORMAL LOW (ref 12.0–15.0)
MCH: 32.8 pg (ref 26.0–34.0)
MCHC: 35 g/dL (ref 30.0–36.0)
MCV: 93.8 fL (ref 80.0–100.0)
Platelets: 213 10*3/uL (ref 150–400)
RBC: 3.05 MIL/uL — ABNORMAL LOW (ref 3.87–5.11)
RDW: 14.9 % (ref 11.5–15.5)
WBC: 3.9 10*3/uL — ABNORMAL LOW (ref 4.0–10.5)
nRBC: 0 % (ref 0.0–0.2)

## 2024-02-28 LAB — PROTEIN / CREATININE RATIO, URINE
Creatinine, Urine: 70 mg/dL
Protein Creatinine Ratio: 1.44 mg/mg{creat} — ABNORMAL HIGH (ref 0.00–0.15)
Total Protein, Urine: 101 mg/dL

## 2024-02-28 LAB — D-DIMER, QUANTITATIVE: D-Dimer, Quant: 9.56 ug{FEU}/mL — ABNORMAL HIGH (ref 0.00–0.50)

## 2024-02-28 LAB — TYPE AND SCREEN
ABO/RH(D): A POS
Antibody Screen: NEGATIVE

## 2024-02-28 MED ORDER — HYDROXYCHLOROQUINE SULFATE 200 MG PO TABS
200.0000 mg | ORAL_TABLET | Freq: Every day | ORAL | Status: DC
  Administered 2024-02-28 – 2024-03-01 (×3): 200 mg via ORAL
  Filled 2024-02-28 (×3): qty 1

## 2024-02-28 MED ORDER — FAMOTIDINE 20 MG PO TABS
40.0000 mg | ORAL_TABLET | Freq: Every day | ORAL | Status: DC
  Administered 2024-02-28 – 2024-03-01 (×3): 40 mg via ORAL
  Filled 2024-02-28 (×3): qty 2

## 2024-02-28 MED ORDER — DOCUSATE SODIUM 100 MG PO CAPS
100.0000 mg | ORAL_CAPSULE | Freq: Every day | ORAL | Status: DC
  Administered 2024-02-28 – 2024-03-01 (×3): 100 mg via ORAL
  Filled 2024-02-28 (×3): qty 1

## 2024-02-28 MED ORDER — METHYLPREDNISOLONE SODIUM SUCC 125 MG IJ SOLR
60.0000 mg | Freq: Two times a day (BID) | INTRAMUSCULAR | Status: AC
  Administered 2024-02-28 – 2024-02-29 (×4): 60 mg via INTRAVENOUS
  Filled 2024-02-28 (×4): qty 2

## 2024-02-28 MED ORDER — LORATADINE 10 MG PO TABS
10.0000 mg | ORAL_TABLET | Freq: Every day | ORAL | Status: DC
  Administered 2024-02-28 – 2024-03-01 (×3): 10 mg via ORAL
  Filled 2024-02-28 (×3): qty 1

## 2024-02-28 MED ORDER — ACETAMINOPHEN 325 MG PO TABS
650.0000 mg | ORAL_TABLET | ORAL | Status: DC | PRN
  Administered 2024-02-28 – 2024-03-01 (×4): 650 mg via ORAL
  Filled 2024-02-28 (×4): qty 2

## 2024-02-28 MED ORDER — CYCLOBENZAPRINE HCL 10 MG PO TABS
10.0000 mg | ORAL_TABLET | Freq: Once | ORAL | Status: AC | PRN
  Administered 2024-02-28: 10 mg via ORAL
  Filled 2024-02-28: qty 1

## 2024-02-28 MED ORDER — CALCIUM CARBONATE ANTACID 500 MG PO CHEW: 2.0000 | CHEWABLE_TABLET | ORAL | Status: DC | PRN

## 2024-02-28 MED ORDER — PRENATAL MULTIVITAMIN CH
1.0000 | ORAL_TABLET | Freq: Every day | ORAL | Status: DC
  Administered 2024-02-29 – 2024-03-01 (×2): 1 via ORAL
  Filled 2024-02-28 (×2): qty 1

## 2024-02-28 MED ORDER — ONDANSETRON 4 MG PO TBDP
4.0000 mg | ORAL_TABLET | Freq: Three times a day (TID) | ORAL | Status: DC | PRN
  Administered 2024-02-28: 4 mg via ORAL
  Filled 2024-02-28: qty 1

## 2024-02-28 MED ORDER — LACTATED RINGERS IV SOLN: 125.0000 mL/h | INTRAVENOUS | Status: AC

## 2024-02-28 MED ORDER — IOHEXOL 350 MG/ML SOLN
75.0000 mL | Freq: Once | INTRAVENOUS | Status: AC | PRN
  Administered 2024-02-28: 75 mL via INTRAVENOUS

## 2024-02-28 NOTE — H&P (Signed)
 FACULTY PRACTICE ANTEPARTUM ADMISSION HISTORY AND PHYSICAL NOTE   History of Present Illness: Brittany [name] is a 43 y.o. [id number] at [redacted]w[redacted]d admitted for lupus flare. Patient was seen in the office today where she states that she overall has not been doing great for the past few weeks.  She notes fatigue weakness dizziness and worsening vertigo.  Additionally over the past 3 weeks she has struggled with shortness of breath, she can barely walk a few feet before she needs to sit down.  Sometimes it does improve with rest.  She also notes a burning ache in her chest.  She rates this pain about a 5 out of 10.  Sometimes when she takes Tums it does seem to improve.  She notes some congestion and headache.  Pregnancy complicated by:  1) lupus and sarcoidosis with +anti-Ro AB -on Prednisone 20mg  daily and Plaquenil -followed by rheumatology  2) Elevated risk of T21 Normal anatomy scan, normal NIPs  Patient reports the fetal movement as active. Patient reports uterine contraction  activity as none. Patient reports  vaginal bleeding as none. Patient describes fluid per vagina as None. Fetal presentation is cephalic.  Patient Active Problem List   Diagnosis Date Noted   Abnormal maternal serum screening test-AFP Tetra Pos  DSR 1:27 01/02/2024   Supervision of other high risk pregnancy, antepartum 12/18/2023   Sarcoidosis 10/22/2018   Systemic lupus erythematosus (HCC) 05/23/2016    Past Medical History:  Diagnosis Date   Abnormal Pap smear of cervix 08/27/2020   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   Anemia    Anemia, unspecified 02/04/2018   Lupus    Sarcoidosis    Seasonal and perennial allergic rhinitis 02/28/2018   Urticaria     Past Surgical History:  Procedure Laterality Date   BIOPSY THYROID      BREAST BIOPSY Right 10/09/2022   MM RT BREAST BX W LOC DEV 1ST LESION IMAGE BX SPEC STEREO GUIDE 10/09/2022 [facility]   BREAST BIOPSY Right 10/25/2022   MM RT BREAST BX W LOC DEV 1ST LESION IMAGE BX SPEC STEREO GUIDE 10/25/2022 [facility]   DILATION AND CURETTAGE OF UTERUS     DILATION AND CURETTAGE, DIAGNOSTIC / THERAPEUTIC  01/04/2018    OB History  Gravida Para Term Preterm AB Living  6 2 1 1 3 3   SAB IAB Ectopic Multiple Live Births  2 1  1 3     # Outcome Date GA Lbr Len/2nd Weight Sex Type Anes PTL Lv  6 Current           5A Preterm 04/30/19 [redacted]w[redacted]d 03:35 / 00:18 2790 g M Vag-Spont EPI  LIV  5B Preterm 04/30/19 [redacted]w[redacted]d 03:35 / 00:33 2700 g M Vag-Spont EPI  LIV  4 Term 2000 [redacted]w[redacted]d   F Vag-Spont EPI N LIV  3 SAB           2 SAB           1 IAB             Social History   Socioeconomic History   Marital status: Married    Spouse name: Not on file   Number of children: Not on file   Years of education: Not on file   Highest education level: Not  on file  Occupational History   Not on file  Tobacco Use   Smoking status: Never   Smokeless tobacco: Never  Vaping Use   Vaping status: Never Used  Substance and Sexual Activity   Alcohol use: Not Currently    Alcohol/week: 1.0 standard drink of alcohol    Types: 1 Glasses of wine per week    Comment: social drinker   Drug use: Never   Sexual activity: Yes    Birth control/protection: None  Other Topics Concern   Not on file  Social History Narrative   Not on file   Social Drivers of Health   Financial Resource Strain: Not on file  Food Insecurity: Not on file  Transportation Needs: Not on file  Physical Activity: Not on file  Stress: Not on file  Social Connections: Not on file    Family History  Problem Relation Age of Onset   Hypertension Mother    Anemia Mother    Food Allergy Mother        red dye, tomato, strawberry   Allergic rhinitis Father    Prostate cancer Father    Heart disease  Father    Allergy (severe) Father        insect bee stings   Allergic rhinitis Brother    Hypertension Brother    Heart disease Paternal Grandfather     Allergies  Allergen Reactions   Latex Hives   Strawberry (Diagnostic) Rash    Medications Prior to Admission  Medication Sig Dispense Refill Last Dose/Taking   acetaminophen (TYLENOL) 325 MG tablet Take 2 tablets (650 mg total) by mouth every 4 (four) hours as needed (for pain scale < 4). 30 tablet 0    aspirin EC 81 MG tablet Take 81 mg by mouth daily. Swallow whole.      Cyanocobalamin (B-12 COMPLIANCE INJECTION IJ) Inject as directed. (Patient not taking: Reported on 02/28/2024)      hydroxychloroquine (PLAQUENIL) 200 MG tablet Take by mouth.      predniSONE (DELTASONE) 20 MG tablet Take 20 mg by mouth daily with breakfast.      Prenatal Vit-Fe Fumarate-FA (MULTIVITAMIN-PRENATAL) 27-0.8 MG TABS tablet Take 1 tablet by mouth daily at 12 noon.       Review of Systems -  see above  Vitals:  BP 126/84 (BP Location: Right Arm)   Pulse 95   Temp 98.2 F (36.8 C) (Oral)   Resp 17   LMP  (LMP Unknown)   SpO2 100%  Physical Examination: CONSTITUTIONAL: No acute distress.  Appears fatigued FACE: Malar rash noted EYES: Conjunctivae and EOM are normal. Pupils are equal, round, and reactive to light. No scleral icterus.  NECK: Normal range of motion, supple, no masses SKIN: Pt starting to have hair loss with discoloration of scalp.  Skin is warm and dry. Erythematous macules noted on hands NEUROLGIC: Alert and oriented to person, place, and time. Normal reflexes, muscle tone coordination. No cranial nerve deficit noted. PSYCHIATRIC: Normal mood and affect. Normal behavior. Normal judgment and thought content. CARDIOVASCULAR: Normal heart rate noted, regular rhythm RESPIRATORY: Effort and breath sounds normal, no problems with respiration noted.  CTAB ABDOMEN: Gravid, Soft, nontender, nondistended. GU: Deferred MUSCULOSKELETAL:  Minimal edema, no calf tenderness bilaterally   Labs:  pending  Imaging Studies: [place] MFM OB FOLLOW UP Result Date: 01/31/2024 ----------------------------------------------------------------------  OBSTETRICS REPORT                       (Signed  Final 01/31/2024 12:34 pm) ---------------------------------------------------------------------- Patient Info  ID #:       [id number]                          D.O.B.:  11/02/1981 (43 yrs)(F)  Name:       CHEREE FOWLES [facility]                Visit Date: 01/31/2024 11:26 am              BROWNLEE ---------------------------------------------------------------------- Performed By  Attending:        Noralee Space MD        Ref. Address:     [address]                                                             [place], [place]                                                             [address]  Performed By:     Eden Lathe BS      [facility]  Referred By:      Levie Heritage                    DO ---------------------------------------------------------------------- Orders  #  Description                           Code        Ordered By  1  [place] MFM OB FOLLOW UP                   19147.82    Rosana Hoes ----------------------------------------------------------------------  #  Order #                     Accession #                Episode #  1  [id number]                   [id number]                 [id number] ---------------------------------------------------------------------- Indications  Advanced maternal age multigravida 40+,        O23.522  second trimester (43 yrs)  Systemic lupus complicating pregnancy,         O26.892, M32.9  second trimester (Plaquenil)  [redacted] weeks gestation of pregnancy                Z3A.26  Antenatal follow-up for  nonvisualized fetal    Z36.2  anatomy  Poor obstetric history: Previous preterm       O09.219  delivery, antepartum (36w twins)  LR NIPS - Female ---------------------------------------------------------------------- Vital Signs  BP:          127/74 ---------------------------------------------------------------------- Fetal Evaluation  Num Of Fetuses:         1  Fetal Heart Rate(bpm):  155  Cardiac Activity:       Observed  Presentation:           Cephalic  Placenta:               Fundal  P. Cord Insertion:      Previously seen  Amniotic Fluid  AFI FV:      Within normal limits                              Largest Pocket(cm)                              3.18 ---------------------------------------------------------------------- Biometry  BPD:        62  mm     G. Age:  25w 1d         12  %    CI:        73.99   %    70 - 86                                                          FL/HC:      22.1   %    18.6 - 20.4  HC:      228.9  mm     G. Age:  24w 6d        3.1  %    HC/AC:      1.08        1.04 - 1.22  AC:      211.4  mm     G. Age:  25w 5d         27  %    FL/BPD:     81.6   %    71 - 87  FL:       50.6  mm     G. Age:  27w 1d         67  %    FL/AC:      23.9   %    20 - 24  LV:        3.1  mm  Est. FW:     891  gm  1 lb 15 oz      36  % ---------------------------------------------------------------------- OB History  Gravidity:    6         Term:   1        Prem:   1        SAB:   2  TOP:          1       Ectopic:  0        Living: 3 ---------------------------------------------------------------------- Gestational Age  U/S Today:     25w 5d                                        EDD:   05/10/24  Best:          26w 1d     Det. By:  Previous Ultrasound      EDD:   05/07/24                                      (12/14/23) ---------------------------------------------------------------------- Anatomy  Cranium:               Appears normal         Aortic Arch:            Previously seen  Cavum:                  Appears normal         Ductal Arch:            Appears normal  Ventricles:            Appears normal         Diaphragm:              Appears normal  Choroid Plexus:        Previously seen        Stomach:                Appears normal, left                                                                        sided  Cerebellum:            Previously seen        Abdomen:                Previously seen  Posterior Fossa:       Previously seen        Abdominal Wall:         Previously seen  Face:                  Appears normal         Cord Vessels:           Previously seen                         (orbits and profile)  Lips:  Appears normal         Kidneys:                Appear normal  Thoracic:              Previously seen        Bladder:                Appears normal  Heart:                 Appears normal         Spine:                  Previously seen                         (4CH, axis, and                         situs)  RVOT:                  Appears normal         Upper Extremities:      Previously seen  LVOT:                  Appears normal         Lower Extremities:      Previously seen  Other:  Fetal anatomic survey complete. ---------------------------------------------------------------------- Cervix Uterus Adnexa  Cervix  Not visualized (advanced GA >24wks)  Uterus  No abnormality visualized.  Right Ovary  Not visualized.  Left Ovary  Not visualized.  Cul De Sac  No free fluid seen.  Adnexa  No abnormality visualized ---------------------------------------------------------------------- Impression  Fetal growth is appropriate for gestational age .Amniotic fluid  is normal and good fetal activity is seen . Ductal arch appears  normal.  SLE on plaquenil. Stable. ---------------------------------------------------------------------- Recommendations  -An appointment was made for her to return in 4 weeks for  fetal growth assessment.  -Weekly BPP from 32 weeks' gestation till delivery.  ----------------------------------------------------------------------                 Noralee Space, MD Electronically Signed Final Report   01/31/2024 12:34 pm ----------------------------------------------------------------------     Assessment and Plan: Patient Active Problem List   Diagnosis Date Noted   Abnormal maternal serum screening test-AFP Tetra Pos  DSR 1:27 01/02/2024   Supervision of other high risk pregnancy, antepartum 12/18/2023   Sarcoidosis 10/22/2018   Systemic lupus erythematosus (HCC) 05/23/2016   -Admit to Antenatal -CBC, CMP, UA, ds DNA, C3/C4 complement -CXR, CTA and ECHO ordered -IV steroids ordered -will review management with MFM -NST twice daily   Myna Hidalgo, DO Attending Obstetrician & Gynecologist, [facility] [employer], [facility]

## 2024-02-28 NOTE — Progress Notes (Signed)
   PRENATAL VISIT NOTE  Subjective:  Brittany [name] is a 43 y.o. [id number] at [redacted]w[redacted]d being seen today for ongoing prenatal care.  She is currently monitored for the following issues for this high-risk pregnancy and has Systemic lupus erythematosus (HCC); Sarcoidosis; Supervision of other high risk pregnancy, antepartum; and Abnormal maternal serum screening test-AFP Tetra Pos  DSR 1:27 on their problem list.  Patient reports  having a lot of joint pain, SOB, nausea, vomiting. Having lupus rash on her face, hands, etc. Saw rheumatologist on 3/10 and had her steroids increased to 20mg  daily, but doesn't feel that this has been helpful. In addition, she has been compliant with her plaquenil .  Contractions: Not present. Vag. Bleeding: None.  Movement: Present. Denies leaking of fluid.   The following portions of the patient's history were reviewed and updated as appropriate: allergies, current medications, past family history, past medical history, past social history, past surgical history and problem list.   Objective:   Vitals:   02/28/24 0812  BP: 108/69  Pulse: (!) 101  Weight: 195 lb (88.5 kg)    Fetal Status: Fetal Heart Rate (bpm): 136   Movement: Present     General:  Alert, oriented and cooperative. Patient is in no acute distress.  Skin: Skin is warm and dry. No rash noted.   Cardiovascular: Normal heart rate noted  Respiratory: Normal respiratory effort, no problems with respiration noted  Abdomen: Soft, gravid, appropriate for gestational age.  Pain/Pressure: Present     Pelvic: Cervical exam deferred        Extremities: Normal range of motion.  Edema: Trace  Mental Status: Normal mood and affect. Normal behavior. Normal judgment and thought content.   Assessment and Plan:  Pregnancy: [id number] at [redacted]w[redacted]d 1. [redacted] weeks gestation of pregnancy (Primary)  2. Supervision of other high risk pregnancy, antepartum FHT normal  3. Other systemic lupus erythematosus with other  organ involvement (HCC) I think she is having a major lupus flareup. Will send her to the hospital for admission. I spoke with Dr Artis Flock with [facility]: check CBC, CMP, C3/C4 levels, DS DNA, UA, DDimer. Possibly check CTA for PE or pleural or cardiac effusion, echo.    Will increase steroids to solumedrol 60mg  BID.  4. Abnormal maternal serum screening test-AFP Tetra Pos  DSR 1:27 Followed by MFM. Normal spine ultrasound.  5. Sarcoidosis   Preterm labor symptoms and general obstetric precautions including but not limited to vaginal bleeding, contractions, leaking of fluid and fetal movement were reviewed in detail with the patient. Please refer to After Visit Summary for other counseling recommendations.   No follow-ups on file.  Future Appointments  Date Time Provider Department Center  02/28/2024  1:30 PM [facility] US6 [facility]  03/10/2024 10:15 AM Wanita Chamberlain, MD [facility] None  03/13/2024  8:30 AM [facility] US2 [facility]  03/20/2024  8:30 AM [facility] US2 [facility]  03/24/2024  8:15 AM Lorriane Shire, MD [facility] None  03/27/2024  8:30 AM [facility] US2 [facility]  04/07/2024  9:55 AM Anyanwu, Jethro Bastos, MD [facility] None  04/14/2024  9:55 AM Shea Evans, Gillian Scarce, MD [facility] None  04/21/2024  9:55 AM Anyanwu, Jethro Bastos, MD [facility] None  04/29/2024  9:55 AM Sue Lush, FNP [facility] None    Levie Heritage, DO

## 2024-02-29 ENCOUNTER — Inpatient Hospital Stay (HOSPITAL_COMMUNITY)

## 2024-02-29 DIAGNOSIS — O09523 Supervision of elderly multigravida, third trimester: Secondary | ICD-10-CM | POA: Diagnosis not present

## 2024-02-29 DIAGNOSIS — D6862 Lupus anticoagulant syndrome: Secondary | ICD-10-CM | POA: Diagnosis not present

## 2024-02-29 DIAGNOSIS — Z3A30 30 weeks gestation of pregnancy: Secondary | ICD-10-CM

## 2024-02-29 DIAGNOSIS — M329 Systemic lupus erythematosus, unspecified: Secondary | ICD-10-CM

## 2024-02-29 DIAGNOSIS — O0993 Supervision of high risk pregnancy, unspecified, third trimester: Secondary | ICD-10-CM

## 2024-02-29 DIAGNOSIS — R079 Chest pain, unspecified: Secondary | ICD-10-CM

## 2024-02-29 DIAGNOSIS — O99113 Other diseases of the blood and blood-forming organs and certain disorders involving the immune mechanism complicating pregnancy, third trimester: Secondary | ICD-10-CM | POA: Diagnosis not present

## 2024-02-29 DIAGNOSIS — O26893 Other specified pregnancy related conditions, third trimester: Secondary | ICD-10-CM

## 2024-02-29 LAB — ECHOCARDIOGRAM COMPLETE
AR max vel: 2.65 cm2
AV Peak grad: 10 mmHg
Ao pk vel: 1.58 m/s
Area-P 1/2: 3.85 cm2
Height: 68 in
MV M vel: 4.76 m/s
MV Peak grad: 90.8 mmHg
S' Lateral: 2.4 cm
Weight: 3120 [oz_av]

## 2024-02-29 LAB — ANTI-DNA ANTIBODY, DOUBLE-STRANDED: ds DNA Ab: 180 [IU]/mL — ABNORMAL HIGH (ref 0–9)

## 2024-02-29 MED ORDER — PREDNISONE 20 MG PO TABS
20.0000 mg | ORAL_TABLET | Freq: Every day | ORAL | Status: DC
  Administered 2024-03-01: 20 mg via ORAL
  Filled 2024-02-29: qty 1

## 2024-02-29 MED ORDER — HYDROXYZINE HCL 50 MG PO TABS
25.0000 mg | ORAL_TABLET | Freq: Three times a day (TID) | ORAL | Status: DC | PRN
  Administered 2024-02-29 (×2): 25 mg via ORAL
  Filled 2024-02-29 (×2): qty 1

## 2024-02-29 NOTE — Progress Notes (Addendum)
 FACULTY PRACTICE ANTEPARTUM(COMPREHENSIVE) NOTE  Brittany [name] is a 43 y.o. [id number] with Estimated Date of Delivery: 05/07/24   By  midtrimester ultrasound [redacted]w[redacted]d  who is admitted for lupus flare.    Fetal presentation is cephalic. Length of Stay:  1  Days  Date of admission:02/28/2024  Subjective: Doing much better today- yesterday rated her symptoms 8/10 of pain/discomfort.  Today she notes her symptoms are about a 3/10.  Denies chest pain or pressure.  Still some mild SOB with exertion, but overall better.  Tolerating po, improved nausea.  No vomiting.  No acute complaints overnight. Patient reports the fetal movement as active. Patient reports uterine contraction  activity as none. Patient reports  vaginal bleeding as none. Patient describes fluid per vagina as None.  Vitals:  Blood pressure 128/73, pulse 96, temperature 98.2 F (36.8 C), temperature source Oral, resp. rate 18, height 5\' 8"  (1.727 m), weight 88.5 kg, SpO2 98%. Vitals:   02/28/24 1606 02/28/24 1950 02/28/24 2348 02/29/24 0903  BP: 118/72 (!) 116/58 119/68 128/73  Pulse: 96 88 91 96  Resp: 16 16 17 18   Temp: 98.2 F (36.8 C) 98.3 F (36.8 C) 98.4 F (36.9 C) 98.2 F (36.8 C)  TempSrc: Oral Oral Oral Oral  SpO2: 99%  98% 98%  Weight:      Height:       Physical Examination:  General appearance - alert, well appearing, and in no distress Mental status - normal mood, behavior, speech, dress, motor activity, and thought processes Chest - CTAB Heart - normal rate and regular rhythm Abdomen - gravid, soft and non-tender, no rebound, no guarding Musculoskeletal - improved movement, no calf tenderness bilaterally Extremities - no edema Skin - warm and dry, malar rash present, no change in prior eyrthematous changes   Fetal Monitoring:  Baseline: 120 bpm, Variability: moderate, Accelerations: +15x15 accels, and Decelerations: Absent   reactive  Labs:  Results for orders placed or performed during the  hospital encounter of 02/28/24 (from the past 24 hours)  Urinalysis, Routine w reflex microscopic -Urine, Clean Catch   Collection Time: 02/28/24 11:30 AM  Result Value Ref Range   Color, Urine YELLOW YELLOW   APPearance CLOUDY (A) CLEAR   Specific Gravity, Urine 1.010 1.005 - 1.030   pH 6.0 5.0 - 8.0   Glucose, UA NEGATIVE NEGATIVE mg/dL   Hgb urine dipstick NEGATIVE NEGATIVE   Bilirubin Urine NEGATIVE NEGATIVE   Ketones, ur NEGATIVE NEGATIVE mg/dL   Protein, ur 454 (A) NEGATIVE mg/dL   Nitrite NEGATIVE NEGATIVE   Leukocytes,Ua SMALL (A) NEGATIVE   RBC / HPF 0-5 0 - 5 RBC/hpf   WBC, UA 11-20 0 - 5 WBC/hpf   Bacteria, UA FEW (A) NONE SEEN   Squamous Epithelial / HPF 21-50 0 - 5 /HPF   Mucus PRESENT    Hyaline Casts, UA PRESENT   Protein / creatinine ratio, urine   Collection Time: 02/28/24 11:30 AM  Result Value Ref Range   Creatinine, Urine 70 mg/dL   Total Protein, Urine 101 mg/dL   Protein Creatinine Ratio 1.44 (H) 0.00 - 0.15 mg/mg[Cre]  Type and screen [facility]   Collection Time: 02/28/24 12:34 PM  Result Value Ref Range   ABO/RH(D) A POS    Antibody Screen NEG    Sample Expiration      03/02/2024,2359 Performed at [facility], [address] [place], [place] [address]   CBC   Collection Time: 02/28/24 12:38 PM  Result Value Ref Range   WBC 3.9 (L) 4.0 - 10.5 K/uL   RBC 3.05 (L) 3.87 - 5.11 MIL/uL   Hemoglobin 10.0 (L) 12.0 - 15.0 g/dL   HCT 02.7 (L) 25.3 - 66.4 %   MCV 93.8 80.0 - 100.0 fL   MCH 32.8 26.0 - 34.0 pg   MCHC 35.0 30.0 - 36.0 g/dL   RDW 40.3 47.4 - 25.9 %   Platelets 213 150 - 400 K/uL   nRBC 0.0 0.0 - 0.2 %  Comprehensive metabolic panel   Collection Time: 02/28/24 12:38 PM  Result Value Ref Range   Sodium 135 135 - 145 mmol/L   Potassium 3.6 3.5 - 5.1 mmol/L   Chloride 106 98 - 111 mmol/L   CO2 19 (L) 22 - 32 mmol/L   Glucose, Bld 83 70 - 99 mg/dL   BUN 5 (L) 6 - 20 mg/dL   Creatinine, Ser 5.63 0.44 - 1.00  mg/dL   Calcium 9.1 8.9 - 87.5 mg/dL   Total Protein 6.6 6.5 - 8.1 g/dL   Albumin 2.2 (L) 3.5 - 5.0 g/dL   AST 19 15 - 41 U/L   ALT 12 0 - 44 U/L   Alkaline Phosphatase 78 38 - 126 U/L   Total Bilirubin 0.6 0.0 - 1.2 mg/dL   GFR, Estimated >64 >33 mL/min   Anion gap 10 5 - 15  D-dimer, quantitative   Collection Time: 02/28/24 12:38 PM  Result Value Ref Range   D-Dimer, Quant 9.56 (H) 0.00 - 0.50 ug/mL-FEU  ECHOCARDIOGRAM COMPLETE   Collection Time: 02/29/24  8:54 AM  Result Value Ref Range   Weight 3,120 oz   Height 68 in   BP 119/68 mmHg    Imaging Studies:    CT Angio Chest Pulmonary Embolism (PE) W or WO Contrast Result Date: 02/28/2024 CLINICAL DATA:  Difficulty breathing EXAM: CT ANGIOGRAPHY CHEST WITH CONTRAST TECHNIQUE: Multidetector CT imaging of the chest was performed using the standard protocol during bolus administration of intravenous contrast. Multiplanar CT image reconstructions and MIPs were obtained to evaluate the vascular anatomy. RADIATION DOSE REDUCTION: This exam was performed according to the departmental dose-optimization program which includes automated exposure control, adjustment of the mA and/or kV according to patient size and/or use of iterative reconstruction technique. CONTRAST:  75mL OMNIPAQUE IOHEXOL 350 MG/ML SOLN COMPARISON:  02/28/2024 FINDINGS: Cardiovascular: Satisfactory opacification of the pulmonary arteries to the segmental level. No evidence of pulmonary embolism. Normal heart size. No pericardial effusion. Nonaneurysmal aorta. No dissection is seen. Mediastinum/Nodes: No enlarged mediastinal, hilar, or axillary lymph nodes. Thyroid gland, trachea, and esophagus demonstrate no significant findings. Lungs/Pleura: Lungs are clear. No pleural effusion or pneumothorax. Upper Abdomen: No acute abnormality. Musculoskeletal: No chest wall abnormality. No acute or significant osseous findings. Review of the MIP images confirms the above findings.  IMPRESSION: Negative. No CT evidence for acute pulmonary embolus. Clear lung fields. Electronically Signed   By: Jasmine Pang M.D.   On: 02/28/2024 17:09   DG Chest 2 View Result Date: 02/28/2024 CLINICAL DATA:  Difficulty breathing EXAM: CHEST - 2 VIEW COMPARISON:  None Available. FINDINGS: The heart size and mediastinal contours are within normal limits. Both lungs are clear. The visualized skeletal structures are unremarkable. IMPRESSION: No active cardiopulmonary disease. Electronically Signed   By: Jasmine Pang M.D.   On: 02/28/2024 16:41     Medications:  Scheduled  docusate sodium  100 mg Oral Daily   famotidine  40 mg Oral Daily  hydroxychloroquine  200 mg Oral Daily   loratadine  10 mg Oral Daily   methylPREDNISolone (SOLU-MEDROL) injection  60 mg Intravenous BID   prenatal multivitamin  1 tablet Oral Q1200   I have reviewed the patient's current medications.  ASSESSMENT: [id number] [redacted]w[redacted]d Estimated Date of Delivery: 05/07/24  Supervision of high risk pregnancy AMA Lupus flare with known SLE and sarcoidosis   PLAN: 1) Lupus flare -currently on IV Solumedrol 60mg  BID -continue Plaquenil 200mg  daily -significant improvement noted -plan to transition back to home medication tomorrow  2) Chest pain- improved -CTA and CXR normal -ECHO pending  3) Fetal well being -reactive NST, cat I tracing  4) Maternal care -routine OB care -continue with claritin, pepcid and zofran for discharge -tylenol and flexeril prn   DISPO: Care as outlined above, if pt continues to clinically improve, plan for discharge home tomorrow.  Rheum appt scheduled for Tuesday  Myna Hidalgo, DO Attending Obstetrician & Gynecologist, [facility] for [facility], [facility]

## 2024-02-29 NOTE — Progress Notes (Signed)
 Echocardiogram 2D Echocardiogram has been performed.  Brittany [name] 02/29/2024, 9:11 AM

## 2024-02-29 NOTE — Consult Note (Addendum)
 MFM Consultation   Brittany [name] is a 43 yo G6P3 who is at 30w 1 d with and EDD of 05/07/24. She is dated by a second trimester [place] 53 w 2 d.   She is seen in the hospital at the request of Dr. Myna Hidalgo for new exacerbation of Lupus.   Brittany [name] was lying in bed and conveyed that her symptoms of shortness of breath, joint ache and pain were improving. She reports that this is the worst flare that she has had since a pregnancy 6 years ago when she had a similar occurrence. Today she notes joint pain, and a significant malar rash that also extends to her neck and back.   She was previously counseled by MFM please see Dr. Parke Poisson on 2/6- where she has been compliant on Plaquenil. She has known + Ro antibodies but does not have antiphospholipid antibody syndrome.  She has been started on a Methylprednisolone 60 IV BID, and is scheduled for CXR and CTA to assess her heart and lungs.  Her labs are overall stable and blood pressure is normal Her UPC is 1.44. She has a mild anemia, C3 C4 and Anti-DNA pending with an elevated Dimer.  She has seen her rheumatologist on 3/10 and is on 20 mg steroid daily but had not improved her symptoms.  Her pregnancy is complicated by an abnormal MSAFP-Normal anatomy exam was observed.      02/29/2024    9:03 AM 02/28/2024   11:48 PM 02/28/2024    7:50 PM  Vitals with BMI  Systolic 128 119 657  Diastolic 73 68 58  Pulse 96 91 88      Latest Ref Rng & Units 02/28/2024   12:38 PM 01/31/2024   10:40 AM 12/26/2023   10:50 AM  CBC  WBC 4.0 - 10.5 K/uL 3.9  3.0  2.9   Hemoglobin 12.0 - 15.0 g/dL 84.6  8.9  9.7   Hematocrit 36.0 - 46.0 % 28.6  24.5  26.4   Platelets 150 - 400 K/uL 213  229  249       Latest Ref Rng & Units 02/28/2024   12:38 PM 05/02/2019    8:05 PM 10/25/2018    4:01 PM  CMP  Glucose 70 - 99 mg/dL 83  85  87   BUN 6 - 20 mg/dL 5  9  5    Creatinine 0.44 - 1.00 mg/dL 9.62  9.52  8.41   Sodium 135 - 145 mmol/L 135  131  133    Potassium 3.5 - 5.1 mmol/L 3.6  3.9  3.3   Chloride 98 - 111 mmol/L 106  100  103   CO2 22 - 32 mmol/L 19  22  21    Calcium 8.9 - 10.3 mg/dL 9.1  9.0  9.4   Total Protein 6.5 - 8.1 g/dL 6.6  5.9  8.6   Total Bilirubin 0.0 - 1.2 mg/dL 0.6  0.7  0.5   Alkaline Phos 38 - 126 U/L 78  179  33   AST 15 - 41 U/L 19  30  18    ALT 0 - 44 U/L 12  19  11      OB History  Gravida Para Term Preterm AB Living  6 2 1 1 3 3   SAB IAB Ectopic Multiple Live Births  2 1 0 1 3    # Outcome Date GA Lbr Len/2nd Weight Sex Type Anes PTL Lv  6 Current  5A Preterm 04/30/19 [redacted]w[redacted]d 03:35 / 00:18 2790 g M Vag-Spont EPI  LIV     Name: MURPH-BROWNLEE,BOYA Sherrine     Apgar1: 9  Apgar5: 9  5B Preterm 04/30/19 [redacted]w[redacted]d 03:35 / 00:33 2700 g M Vag-Spont EPI  LIV     Name: MURPH-BROWNLEE,BOYB Kerie     Apgar1: 9  Apgar5: 9  4 Term 2000 [redacted]w[redacted]d   F Vag-Spont EPI N LIV  3 SAB           2 SAB           1 IAB            Past Medical History:  Diagnosis Date   Abnormal Pap smear of cervix 08/27/2020   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   Anemia    Anemia, unspecified 02/04/2018   Lupus    Sarcoidosis    Seasonal and perennial allergic rhinitis 02/28/2018   Urticaria    Past Surgical History:  Procedure Laterality Date   BIOPSY THYROID     BREAST BIOPSY Right 10/09/2022   MM RT BREAST BX W LOC DEV 1ST LESION IMAGE BX SPEC STEREO GUIDE 10/09/2022 [facility]   BREAST BIOPSY Right 10/25/2022   MM RT BREAST BX W LOC DEV 1ST LESION IMAGE BX SPEC STEREO GUIDE 10/25/2022 [facility]   DILATION AND CURETTAGE OF UTERUS     DILATION AND CURETTAGE, DIAGNOSTIC / THERAPEUTIC  01/04/2018   Social History   Socioeconomic History   Marital status: Married    Spouse name: Not on file   Number of children: Not on file   Years of  education: Not on file   Highest education level: Not on file  Occupational History   Not on file  Tobacco Use   Smoking status: Never   Smokeless tobacco: Never  Vaping Use   Vaping status: Never Used  Substance and Sexual Activity   Alcohol use: Not Currently    Alcohol/week: 1.0 standard drink of alcohol    Types: 1 Glasses of wine per week    Comment: social drinker   Drug use: Never   Sexual activity: Yes    Birth control/protection: None  Other Topics Concern   Not on file  Social History Narrative   Not on file   Social Drivers of Health   Financial Resource Strain: Not on file  Food Insecurity: No Food Insecurity (02/28/2024)   Hunger Vital Sign    Worried About Running Out of Food in the Last Year: Never true    Ran Out of Food in the Last Year: Never true  Transportation Needs: No Transportation Needs (02/28/2024)   PRAPARE - Transportation    Lack of Transportation (Medical): No    Lack of Transportation (Non-Medical): No  Physical Activity: Not on file  Stress: Not on file  Social Connections: Not on file  Intimate Partner Violence: Not At Risk (02/28/2024)   Humiliation, Afraid, Rape, and Kick questionnaire    Fear of Current or Ex-Partner: No    Emotionally Abused: No    Physically Abused: No    Sexually Abused: No    Current Facility-Administered Medications (Endocrine & Metabolic):    methylPREDNISolone sodium succinate (SOLU-MEDROL) 125 mg/2 mL injection 60 mg   [START ON 03/01/2024] predniSONE (DELTASONE) tablet  20 mg     Current Facility-Administered Medications (Respiratory):    loratadine (CLARITIN) tablet 10 mg   Current Facility-Administered Medications (Analgesics):    acetaminophen (TYLENOL) tablet 650 mg     Current Facility-Administered Medications (Other):    calcium carbonate (TUMS - dosed in mg elemental calcium) chewable tablet 400 mg of elemental calcium   docusate sodium (COLACE) capsule 100 mg   famotidine (PEPCID) tablet  40 mg   hydroxychloroquine (PLAQUENIL) tablet 200 mg   lactated ringers infusion   ondansetron (ZOFRAN-ODT) disintegrating tablet 4 mg   prenatal multivitamin tablet 1 tablet  No current outpatient medications on file. Allergies  Allergen Reactions   Latex Hives   Strawberry (Diagnostic) Rash    Impression/counseling:  Systemic Lupus Erythematous Flare:  I discussed with Brittany [name] that diagnosis and management of SLE flare in pregnancy,  One of the concerns in pregnancy is differentiating a flare vs preeclampsia especially in a woman with hypertension and proteinuria. Fortunately, Brittany [name] does not have hypertension and her creatinine is normal although her UPC is elevated. Her Urine analysis does have cast  but no hematuria suggestive that she does not have lupus nephritis at this time or if she does it is mild.   The mainstay of treatment is to pursue disease quiescence via high dose steroids, which appears that she is beginning to experience some benefit.   Secondly, we discussed the increased risk to neonatal lupus with fetal heart block given that she does have positive RO antibodies, fetal growth restriction and preeclampsia. The risk is low for these issues at this time.   Most recent fetal growth on 2/27 was normal at 36th% with an AC of th 27th%.  - Repeat growth is recommended within the next week or so.   She should continue serial growth exams and initiate weekly antenatal testing at 32 weeks. If hypertension is discovered consider preeclampsia until proven otherwise given that hypertension has not been a characteristic of her SLE activity previously.   Delivery between 37-39 weeks pending maternal fetal status. Postpartum monitoring for severe disease activity is recommended.  It may be of value to increase her plaquenil to 300-400 mg BID as it has been shown to reduce the frequency of flares and improve maternal symptoms. She is seeing her Rheumatologist in  early next week.   Lastly, given that she has been on 20 mg of oral prednisone she will likely warrant stress dose steroids at delivery.  Discharge when stable.   All questions answered  Brittany Olive, MD  I spent 60 minute with > 50% in face to face consultation, care coordination and medical record review.

## 2024-02-29 NOTE — Plan of Care (Signed)

## 2024-02-29 NOTE — Plan of Care (Signed)

## 2024-03-01 ENCOUNTER — Other Ambulatory Visit (HOSPITAL_COMMUNITY): Payer: Self-pay

## 2024-03-01 DIAGNOSIS — D6862 Lupus anticoagulant syndrome: Secondary | ICD-10-CM | POA: Diagnosis not present

## 2024-03-01 DIAGNOSIS — O99113 Other diseases of the blood and blood-forming organs and certain disorders involving the immune mechanism complicating pregnancy, third trimester: Secondary | ICD-10-CM | POA: Diagnosis not present

## 2024-03-01 DIAGNOSIS — Z3A30 30 weeks gestation of pregnancy: Secondary | ICD-10-CM | POA: Diagnosis not present

## 2024-03-01 LAB — C4 COMPLEMENT: Complement C4, Body Fluid: 3 mg/dL — ABNORMAL LOW (ref 12–38)

## 2024-03-01 LAB — C3 COMPLEMENT: C3 Complement: 76 mg/dL — ABNORMAL LOW (ref 82–167)

## 2024-03-01 MED ORDER — CYCLOBENZAPRINE HCL 5 MG PO TABS
5.0000 mg | ORAL_TABLET | Freq: Three times a day (TID) | ORAL | 0 refills | Status: DC | PRN
  Filled 2024-03-01: qty 20, 4d supply, fill #0

## 2024-03-01 MED ORDER — HYDROXYZINE HCL 25 MG PO TABS
25.0000 mg | ORAL_TABLET | Freq: Three times a day (TID) | ORAL | 0 refills | Status: DC | PRN
  Filled 2024-03-01: qty 30, 10d supply, fill #0

## 2024-03-01 MED ORDER — FAMOTIDINE 40 MG PO TABS
40.0000 mg | ORAL_TABLET | Freq: Every day | ORAL | 0 refills | Status: DC
  Filled 2024-03-01: qty 30, 30d supply, fill #0

## 2024-03-01 MED ORDER — LORATADINE 10 MG PO TABS
10.0000 mg | ORAL_TABLET | Freq: Every day | ORAL | 0 refills | Status: DC
  Filled 2024-03-01: qty 30, 30d supply, fill #0

## 2024-03-01 MED ORDER — ONDANSETRON 4 MG PO TBDP
4.0000 mg | ORAL_TABLET | Freq: Three times a day (TID) | ORAL | 0 refills | Status: DC | PRN
  Filled 2024-03-01: qty 20, 7d supply, fill #0

## 2024-03-01 NOTE — Discharge Summary (Signed)
 Physician Discharge Summary  Patient ID: Brittany [name] MRN: [id number] DOB/AGE: 12/07/1980 43 y.o.  Admit date: 02/28/2024 Discharge date: 03/01/2024  Admission Diagnoses: Lupus flare with SLE and sarcoidosis, AMA, High risk pregnancy in third trimester, Chest pain  Discharge Diagnoses:  Principal Problem:   Lupus anticoagulant complicating pregnancy, antepartum (HCC) Active Problems:   Multigravida of advanced maternal age in third trimester   Supervision of high risk pregnancy in third trimester   Discharged Condition: stable  Hospital Course: 43 year old G6 P1-1-3-3 at [redacted]w[redacted]d admitted due to lupus flare with significant chest pain.  Patient with past medical history significant for lupus and sarcoidosis.  Patient was treated with IV steroids, Solu-Medrol 60 Mg twice daily.  Due to her chest pain she was ruled out for cardiac abnormalities or PE.  All imaging including chest x-ray, CTA and echo were normal.  Patient noted significant improvement with steroids as well as supportive care.  She was discharged home in stable condition on hospital day #2 with plans for close outpatient follow-up  Consults:  MFM  Significant Diagnostic Studies:  Results for orders placed or performed during the hospital encounter of 02/28/24 (from the past 48 hours)  Type and screen [facility]     Status: None   Collection Time: 02/28/24 12:34 PM  Result Value Ref Range   ABO/RH(D) A POS    Antibody Screen NEG    Sample Expiration      03/02/2024,2359 Performed at [facility], [address], [place], [place] [address]   CBC     Status: Abnormal   Collection Time: 02/28/24 12:38 PM  Result Value Ref Range   WBC 3.9 (L) 4.0 - 10.5 K/uL   RBC 3.05 (L) 3.87 - 5.11 MIL/uL   Hemoglobin 10.0 (L) 12.0 - 15.0 g/dL   HCT 19.1 (L) 47.8 - 29.5 %   MCV 93.8 80.0 - 100.0 fL   MCH 32.8 26.0 - 34.0 pg   MCHC 35.0 30.0 - 36.0 g/dL   RDW 62.1 30.8 - 65.7 %   Platelets 213 150 -  400 K/uL   nRBC 0.0 0.0 - 0.2 %    Comment: Performed at [facility], [address] [place], [place] [address]  Comprehensive metabolic panel     Status: Abnormal   Collection Time: 02/28/24 12:38 PM  Result Value Ref Range   Sodium 135 135 - 145 mmol/L   Potassium 3.6 3.5 - 5.1 mmol/L   Chloride 106 98 - 111 mmol/L   CO2 19 (L) 22 - 32 mmol/L   Glucose, Bld 83 70 - 99 mg/dL    Comment: Glucose reference range applies only to samples taken after fasting for at least 8 hours.   BUN 5 (L) 6 - 20 mg/dL   Creatinine, Ser 2.95 0.44 - 1.00 mg/dL   Calcium 9.1 8.9 - 28.4 mg/dL   Total Protein 6.6 6.5 - 8.1 g/dL   Albumin 2.2 (L) 3.5 - 5.0 g/dL   AST 19 15 - 41 U/L   ALT 12 0 - 44 U/L   Alkaline Phosphatase 78 38 - 126 U/L   Total Bilirubin 0.6 0.0 - 1.2 mg/dL   GFR, Estimated >13 >24 mL/min    Comment: (NOTE) Calculated using the CKD-EPI Creatinine Equation (2021)    Anion gap 10 5 - 15    Comment: Performed at [facility], [address], [place], [place] [address]  D-dimer, quantitative  Status: Abnormal   Collection Time: 02/28/24 12:38 PM  Result Value Ref Range   D-Dimer, Quant 9.56 (H) 0.00 - 0.50 ug/mL-FEU    Comment: (NOTE) At the manufacturer cut-off value of 0.5 g/mL FEU, this assay has a negative predictive value of 95-100%.This assay is intended for use in conjunction with a clinical pretest probability (PTP) assessment model to exclude pulmonary embolism (PE) and deep venous thrombosis (DVT) in outpatients suspected of PE or DVT. Results should be correlated with clinical presentation. Performed at [facility], [address] [place], [place] [address]   C3 complement     Status: Abnormal   Collection Time: 02/28/24 12:38 PM  Result Value Ref Range   C3 Complement 76 (L) 82 - 167 mg/dL    Comment: (NOTE) Performed At: [facility] [address] [place], [place] [id number] Jolene Schimke MD MV:[id number]   C4 complement      Status: Abnormal   Collection Time: 02/28/24 12:38 PM  Result Value Ref Range   Complement C4, Body Fluid 3 (L) 12 - 38 mg/dL    Comment: (NOTE) Performed At: [facility] [address] [place], [place] [id number] Jolene Schimke MD UU:[id number]   Anti-DNA antibody, double-stranded     Status: Abnormal   Collection Time: 02/28/24 12:38 PM  Result Value Ref Range   ds DNA Ab 180 (H) 0 - 9 IU/mL    Comment: (NOTE)                                   Negative      <5                                   Equivocal  5 - 9                                   Positive      >9 Performed At: [facility] [address] [place], [place] [id number] Jolene Schimke MD OV:[id number]      Treatments: IV hydration and steroids: solu-medrol  Discharge Exam: Blood pressure 125/63, pulse 100, temperature 98.1 F (36.7 C), temperature source Oral, resp. rate 17, height 5\' 8"  (1.727 m), weight 88.5 kg, SpO2 100%. General appearance: alert and no distress Lungs: CTAB Cardio: regular rate and rhythm GI: gravid, soft and non-tender, no rebound, no guarding Extremities: extremities normal, atraumatic, no cyanosis or edema Skin: warm and dry- prior rashes noted- no change Neurologic: Alert and oriented X 3, normal strength and tone. Normal symmetric reflexes. Normal coordination and gait  Disposition:    Allergies as of 03/01/2024       Reactions   Latex Hives   Strawberry (diagnostic) Rash        Medication List     TAKE these medications    acetaminophen 325 MG tablet Commonly known as: Tylenol Take 2 tablets (650 mg total) by mouth every 4 (four) hours as needed (for pain scale < 4).   aspirin EC 81 MG tablet Take 81 mg by mouth daily. Swallow whole.   B-12 COMPLIANCE INJECTION IJ Inject as directed.   cyclobenzaprine 5 MG tablet Commonly known as: FLEXERIL Take 1-2 tablets (5-10 mg total) by mouth 3 (three) times daily as needed for muscle spasms.  famotidine  40 MG tablet Commonly known as: PEPCID Take 1 tablet (40 mg total) by mouth daily.   hydroxychloroquine 200 MG tablet Commonly known as: PLAQUENIL Take by mouth.   hydrOXYzine 25 MG tablet Commonly known as: ATARAX Take 1 tablet (25 mg total) by mouth 3 (three) times daily as needed for itching.   loratadine 10 MG tablet Commonly known as: CLARITIN Take 1 tablet (10 mg total) by mouth daily.   multivitamin-prenatal 27-0.8 MG Tabs tablet Take 1 tablet by mouth daily at 12 noon.   ondansetron 4 MG disintegrating tablet Commonly known as: ZOFRAN-ODT Dissolve 1 tablet (4 mg total) on tongue by mouth every 8 (eight) hours as needed for nausea or vomiting. (Take 1 tablet (4 mg total) by mouth every 8 (eight) hours as needed for nausea or vomiting.)   predniSONE 20 MG tablet Commonly known as: DELTASONE Take 20 mg by mouth daily with breakfast.        Follow-up Information     [facility] Follow up.   Specialty: Obstetrics and Gynecology Why: Follow up as scheduled Contact information: [address] [place] [address] [phone]                Signed: Sharon Seller 03/01/2024, 10:29 AM

## 2024-03-01 NOTE — Progress Notes (Signed)
 FACULTY PRACTICE ANTEPARTUM(COMPREHENSIVE) NOTE  Elnita A [name] is a 43 y.o. [id number] with Estimated Date of Delivery: 05/07/24   By  midtrimester ultrasound 303d  who is admitted for lupus flare.    Fetal presentation is cephalic. Length of Stay:  2  Days  Date of admission:02/28/2024  Subjective:  Today she notes that she is doing about the same, maybe a bit better.  Still rates her symptoms about a 3 out of 10, which is likely to be her baseline.  She denies chest pain or shortness of breath.  Still having some fatigue, but again better than where she has been.  Reports no acute complaints or concerns overnight.  She is feeling that she could go home later today   Patient reports the fetal movement as active. Patient reports uterine contraction  activity as none. Patient reports  vaginal bleeding as none. Patient describes fluid per vagina as None.  Vitals:  Blood pressure 125/63, pulse 100, temperature 98.1 F (36.7 C), temperature source Oral, resp. rate 17, height 5\' 8"  (1.727 m), weight 88.5 kg, SpO2 100%. Vitals:   02/29/24 1543 02/29/24 2040 02/29/24 2305 03/01/24 0806  BP: 139/74 (!) 140/79 132/70 125/63  Pulse: 89 (!) 110 99 100  Resp:  17 19 17   Temp:  98.3 F (36.8 C) 98.3 F (36.8 C) 98.1 F (36.7 C)  TempSrc:  Oral Oral Oral  SpO2:  100% 98% 100%  Weight:      Height:       Physical Examination:  General appearance - alert, well appearing, and in no distress Mental status - normal mood, behavior, speech, dress, motor activity, and thought processes Chest - CTAB Heart - normal rate and regular rhythm Abdomen - gravid, soft and non-tender, no rebound, no guarding Musculoskeletal - improved movement, no calf tenderness bilaterally Extremities - no edema Skin - warm and dry, malar rash present, no change in prior eyrthematous changes   Fetal Monitoring:  FHT 135bpm, moderate variablity, +accels, no decels Reactive NST  Labs:  No results found for this  or any previous visit (from the past 24 hours).   Imaging Studies:    ECHOCARDIOGRAM COMPLETE Result Date: 02/29/2024    ECHOCARDIOGRAM REPORT   Patient Name:   Brittany [name] Date of Exam: 02/29/2024 Medical Rec #:  [id number]                Height:       68.0 in Accession #:    [id number]               Weight:       195.0 lb Date of Birth:  1981/08/12                 BSA:          2.022 m Patient Age:    43 years                 BP:           119/68 mmHg Patient Gender: F                        HR:           88 bpm. Exam Location:  Inpatient Procedure: 2D Echo, 3D Echo, Cardiac Doppler, Color Doppler and Strain Analysis            (Both Spectral and Color Flow Doppler were utilized during  procedure). Indications:    Chest Pain R07.9  History:        Patient has no prior history of Echocardiogram examinations.  Sonographer:    Lucendia Herrlich RCS Referring Phys: [id number] Myna Hidalgo IMPRESSIONS  1. Left ventricular ejection fraction, by estimation, is 60 to 65%. The left ventricle has normal function. The left ventricle has no regional wall motion abnormalities. Left ventricular diastolic parameters were normal. The average left ventricular global longitudinal strain is -18.0 %. The global longitudinal strain is normal.  2. Right ventricular systolic function is normal. The right ventricular size is normal. There is mildly elevated pulmonary artery systolic pressure. The estimated right ventricular systolic pressure is 40.7 mmHg.  3. A small pericardial effusion is present. The pericardial effusion is posterior to the left ventricle.  4. The mitral valve is normal in structure. Mild mitral valve regurgitation. No evidence of mitral stenosis.  5. The aortic valve is tricuspid. Aortic valve regurgitation is not visualized. No aortic stenosis is present.  6. The inferior vena cava is dilated in size with >50% respiratory variability, suggesting right atrial pressure of 8 mmHg.  7. Cannot  exclude a small PFO. Comparison(s): No prior Echocardiogram. Conclusion(s)/Recommendation(s): May consider limited echocardiogram with bubble study if clinically indicated. FINDINGS  Left Ventricle: Left ventricular ejection fraction, by estimation, is 60 to 65%. The left ventricle has normal function. The left ventricle has no regional wall motion abnormalities. The average left ventricular global longitudinal strain is -18.0 %. Strain was performed and the global longitudinal strain is normal. 3D ejection fraction reviewed and evaluated as part of the interpretation. Alternate measurement of EF is felt to be most reflective of LV function. The left ventricular internal cavity size was normal in size. There is no left ventricular hypertrophy. Left ventricular diastolic parameters were normal. Right Ventricle: The right ventricular size is normal. No increase in right ventricular wall thickness. Right ventricular systolic function is normal. There is mildly elevated pulmonary artery systolic pressure. The tricuspid regurgitant velocity is 2.86  m/s, and with an assumed right atrial pressure of 8 mmHg, the estimated right ventricular systolic pressure is 40.7 mmHg. Left Atrium: Left atrial size was normal in size. Right Atrium: Right atrial size was normal in size. Pericardium: A small pericardial effusion is present. The pericardial effusion is posterior to the left ventricle. Mitral Valve: The mitral valve is normal in structure. Mild mitral valve regurgitation. No evidence of mitral valve stenosis. Tricuspid Valve: The tricuspid valve is normal in structure. Tricuspid valve regurgitation is mild . No evidence of tricuspid stenosis. Aortic Valve: The aortic valve is tricuspid. Aortic valve regurgitation is not visualized. No aortic stenosis is present. Aortic valve peak gradient measures 10.0 mmHg. Pulmonic Valve: The pulmonic valve was grossly normal. Pulmonic valve regurgitation is mild. No evidence of pulmonic  stenosis. Aorta: The aortic root and ascending aorta are structurally normal, with no evidence of dilitation. Venous: The inferior vena cava is dilated in size with greater than 50% respiratory variability, suggesting right atrial pressure of 8 mmHg. IAS/Shunts: The interatrial septum appears to be lipomatous. Cannot exclude a small PFO.  LEFT VENTRICLE PLAX 2D LVIDd:         4.40 cm   Diastology LVIDs:         2.40 cm   LV e' medial:    10.40 cm/s LV PW:         1.00 cm   LV E/e' medial:  8.8 LV IVS:  1.00 cm   LV e' lateral:   13.10 cm/s LVOT diam:     2.10 cm   LV E/e' lateral: 7.0 LV SV:         77 LV SV Index:   38        2D Longitudinal Strain LVOT Area:     3.46 cm  2D Strain GLS Avg:     -18.0 %                           3D Volume EF:                          3D EF:        75 %                          LV EDV:       213 ml                          LV ESV:       54 ml                          LV SV:        159 ml RIGHT VENTRICLE             IVC RV S prime:     18.10 cm/s  IVC diam: 2.60 cm TAPSE (M-mode): 2.5 cm LEFT ATRIUM             Index        RIGHT ATRIUM           Index LA diam:        4.30 cm 2.13 cm/m   RA Area:     14.30 cm LA Vol (A2C):   63.0 ml 31.16 ml/m  RA Volume:   38.00 ml  18.79 ml/m LA Vol (A4C):   69.9 ml 34.57 ml/m LA Biplane Vol: 70.3 ml 34.77 ml/m  AORTIC VALVE AV Area (Vmax): 2.65 cm AV Vmax:        158.00 cm/s AV Peak Grad:   10.0 mmHg LVOT Vmax:      121.00 cm/s LVOT Vmean:     82.100 cm/s LVOT VTI:       0.222 m  AORTA Ao Root diam: 3.00 cm Ao Asc diam:  3.00 cm MITRAL VALVE               TRICUSPID VALVE MV Area (PHT): 3.85 cm    TR Peak grad:   32.7 mmHg MV Decel Time: 197 msec    TR Vmax:        286.00 cm/s MR Peak grad: 90.8 mmHg MR Vmax:      476.33 cm/s  SHUNTS MV E velocity: 91.80 cm/s  Systemic VTI:  0.22 m MV A velocity: 59.00 cm/s  Systemic Diam: 2.10 cm MV E/A ratio:  1.56 Sunit Tolia Electronically signed by Tessa Lerner Signature Date/Time:  02/29/2024/5:02:58 PM    Final    CT Angio Chest Pulmonary Embolism (PE) W or WO Contrast Result Date: 02/28/2024 CLINICAL DATA:  Difficulty breathing EXAM: CT ANGIOGRAPHY CHEST WITH CONTRAST TECHNIQUE: Multidetector CT imaging of the chest was performed using the standard protocol during bolus administration of intravenous contrast. Multiplanar CT image reconstructions and MIPs were obtained to evaluate  the vascular anatomy. RADIATION DOSE REDUCTION: This exam was performed according to the departmental dose-optimization program which includes automated exposure control, adjustment of the mA and/or kV according to patient size and/or use of iterative reconstruction technique. CONTRAST:  75mL OMNIPAQUE IOHEXOL 350 MG/ML SOLN COMPARISON:  02/28/2024 FINDINGS: Cardiovascular: Satisfactory opacification of the pulmonary arteries to the segmental level. No evidence of pulmonary embolism. Normal heart size. No pericardial effusion. Nonaneurysmal aorta. No dissection is seen. Mediastinum/Nodes: No enlarged mediastinal, hilar, or axillary lymph nodes. Thyroid gland, trachea, and esophagus demonstrate no significant findings. Lungs/Pleura: Lungs are clear. No pleural effusion or pneumothorax. Upper Abdomen: No acute abnormality. Musculoskeletal: No chest wall abnormality. No acute or significant osseous findings. Review of the MIP images confirms the above findings. IMPRESSION: Negative. No CT evidence for acute pulmonary embolus. Clear lung fields. Electronically Signed   By: Jasmine Pang M.D.   On: 02/28/2024 17:09   DG Chest 2 View Result Date: 02/28/2024 CLINICAL DATA:  Difficulty breathing EXAM: CHEST - 2 VIEW COMPARISON:  None Available. FINDINGS: The heart size and mediastinal contours are within normal limits. Both lungs are clear. The visualized skeletal structures are unremarkable. IMPRESSION: No active cardiopulmonary disease. Electronically Signed   By: Jasmine Pang M.D.   On: 02/28/2024 16:41      Medications:  Scheduled  docusate sodium  100 mg Oral Daily   famotidine  40 mg Oral Daily   hydroxychloroquine  200 mg Oral Daily   loratadine  10 mg Oral Daily   predniSONE  20 mg Oral Q breakfast   prenatal multivitamin  1 tablet Oral Q1200   I have reviewed the patient's current medications.  ASSESSMENT: [id number] [redacted]w[redacted]d Estimated Date of Delivery: 05/07/24  Supervision of high risk pregnancy AMA Lupus flare with known SLE and sarcoidosis   PLAN: 1) Lupus flare -Status post IV Solu-Medrol 60mg  twice daily, patient transitioned back to home regimen of prednisone -Continue Plaquenil -Will continue Tylenol and Flexeril as needed -Also continue hydroxyzine for itching and irritation as needed  2) Chest pain- improved -CTA and CXR normal -ECHO completed, normal EF 60-65%  3) Fetal well being -NST reactive today and continues to be appropriate for current gestational age  24) Maternal care -routine OB care -continue with claritin, pepcid and zofran for discharge -tylenol and flexeril prn   DISPO: Plan for discharge home later today if she remains feeling well.  Patient to follow-up as scheduled with both OB and rheumatology  Myna Hidalgo, DO Attending Obstetrician & Gynecologist, [facility] for [facility], [facility]

## 2024-03-04 DIAGNOSIS — Z79899 Other long term (current) drug therapy: Secondary | ICD-10-CM | POA: Diagnosis not present

## 2024-03-04 DIAGNOSIS — Z3A31 31 weeks gestation of pregnancy: Secondary | ICD-10-CM | POA: Diagnosis not present

## 2024-03-04 DIAGNOSIS — O99891 Other specified diseases and conditions complicating pregnancy: Secondary | ICD-10-CM | POA: Diagnosis not present

## 2024-03-04 DIAGNOSIS — M329 Systemic lupus erythematosus, unspecified: Secondary | ICD-10-CM | POA: Diagnosis not present

## 2024-03-04 DIAGNOSIS — D709 Neutropenia, unspecified: Secondary | ICD-10-CM | POA: Diagnosis not present

## 2024-03-10 ENCOUNTER — Encounter: Payer: Medicaid Other | Admitting: Obstetrics and Gynecology

## 2024-03-10 NOTE — Progress Notes (Deleted)
 PRENATAL VISIT NOTE  Subjective:  Brittany [name] is a 43 y.o. [id number] at [redacted]w[redacted]d being seen today for ongoing prenatal care.  She is currently monitored for the following issues for this high-risk pregnancy and has Systemic lupus erythematosus (HCC); Sarcoidosis; Supervision of other high risk pregnancy, antepartum; Abnormal maternal serum screening test-AFP Tetra Pos  DSR 1:27; Lupus anticoagulant complicating pregnancy, antepartum (HCC); Multigravida of advanced maternal age in third trimester; and Supervision of high risk pregnancy in third trimester on their problem list.  S/p recent hospitalization for lupus flare. Improved s/p steroids. Last growth 3/29: EFW 36% Patient with plan to continue daily prednisone as prescribed, continue serial growth, start twice weekly antenatal testing at 32 wks  Discharge summary: "Hospital Course: 44 year old G6 P1-1-3-3 at [redacted]w[redacted]d admitted due to lupus flare with significant chest pain.  Patient with past medical history significant for lupus and sarcoidosis.  Patient was treated with IV steroids, Solu-Medrol 60 Mg twice daily.  Due to her chest pain she was ruled out for cardiac abnormalities or PE.  All imaging including chest x-ray, CTA and echo were normal.  Patient noted significant improvement with steroids as well as supportive care.  She was discharged home in stable condition on hospital day #2 with plans for close outpatient follow-up"  Patient reports {sx:14538}.   .  .   . Denies leaking of fluid.   The following portions of the patient's history were reviewed and updated as appropriate: allergies, current medications, past family history, past medical history, past social history, past surgical history and problem list.   Objective:  There were no vitals filed for this visit.  Fetal Status:           General:  Alert, oriented and cooperative. Patient is in no acute distress.  Skin: Skin is warm and dry. No rash noted.    Cardiovascular: Normal heart rate noted  Respiratory: Normal respiratory effort, no problems with respiration noted  Abdomen: Soft, gravid, appropriate for gestational age.        Pelvic: {Blank single:19197::"Cervical exam performed in the presence of a chaperone","Cervical exam deferred"}        Extremities: Normal range of motion.     Mental Status: Normal mood and affect. Normal behavior. Normal judgment and thought content.   Assessment and Plan:  Pregnancy: A5W0981 at [redacted]w[redacted]d 1. Supervision of other high risk pregnancy, antepartum (Primary) - recent growth within normal limits - BPP scheduled for 4/10 with MFM, will help arrange twice weekly antenatal testing moving forward - routine labs up to date ***  2. Lupus anticoagulant complicating pregnancy, antepartum (HCC) - continue daily prednisone and plaquenil - f/u with rheum - f/u with MFM as above ***  3. Sarcoidosis - as above ***  4. Abnormal maternal serum screening test-AFP Tetra Pos  DSR 1:27 - normal NIPT, declined amnio, followed by MFM ***  5. Anemia: - iron encouraged . {Blank single:19197::"Term","Preterm"} labor symptoms and general obstetric precautions including but not limited to vaginal bleeding, contractions, leaking of fluid and fetal movement were reviewed in detail with the patient. Please refer to After Visit Summary for other counseling recommendations.   No follow-ups on file.  Future Appointments  Date Time Provider Department Center  03/10/2024 10:15 AM Wanita Chamberlain, MD [facility] None  03/13/2024  8:30 AM [facility] US2 [facility]  03/20/2024  8:30 AM [facility] US2 [facility]  03/24/2024  8:15 AM Lorriane Shire, MD [facility] None  03/27/2024  8:30 AM [facility] US2 [facility]  [facility]  04/07/2024  9:55 AM Anyanwu, Jethro Bastos, MD [facility] None  04/14/2024  9:55 AM Shea Evans, Gillian Scarce, MD [facility] None  04/21/2024  9:55 AM Anyanwu, Jethro Bastos, MD [facility] None  04/29/2024  9:55 AM Sue Lush, FNP  [facility] None    Wanita Chamberlain, MD, FACOG Obstetrician & Gynecologist, [facility] for [facility], [facility]

## 2024-03-11 ENCOUNTER — Ambulatory Visit (INDEPENDENT_AMBULATORY_CARE_PROVIDER_SITE_OTHER)

## 2024-03-11 VITALS — BP 121/74 | HR 99 | Wt 195.0 lb

## 2024-03-11 DIAGNOSIS — M329 Systemic lupus erythematosus, unspecified: Secondary | ICD-10-CM

## 2024-03-11 DIAGNOSIS — Z3A31 31 weeks gestation of pregnancy: Secondary | ICD-10-CM | POA: Diagnosis not present

## 2024-03-11 DIAGNOSIS — O09899 Supervision of other high risk pregnancies, unspecified trimester: Secondary | ICD-10-CM

## 2024-03-11 NOTE — Progress Notes (Signed)
   HIGH-RISK PREGNANCY OFFICE VISIT  Patient name: Brittany Brittany [name] MRN [id number]  Date of birth: 18-Jul-1981 Chief Complaint:   Routine Prenatal Visit  Subjective:   Brittany Brittany [name] is a 43 y.o. [id number] female at [redacted]w[redacted]d with an Estimated Date of Delivery: 05/07/24 being seen today for ongoing management of a high-risk pregnancy aeb has Systemic lupus erythematosus (HCC); Sarcoidosis; Supervision of other high risk pregnancy, antepartum; Abnormal maternal serum screening test-AFP Tetra Pos  DSR 1:27; Lupus anticoagulant complicating pregnancy, antepartum (HCC); Multigravida of advanced maternal age in third trimester; and Supervision of high risk pregnancy in third trimester on their problem list.  Patient presents today, with mother, with Brittany [name] complaints.  However, patient with questions regarding BTL and IOL. Patient endorses fetal movement. Patient denies abdominal cramping or contractions.  Patient denies vaginal concerns including abnormal discharge, leaking of fluid, and bleeding. Brittany [name] issues with urination, constipation, or diarrhea.    Contractions: Not present. Vag. Bleeding: None.  Movement: Present.  Reviewed past medical,surgical, social, obstetrical and family history as well as problem list, medications and allergies.  Objective   Vitals:   03/11/24 1441  BP: 121/74  Pulse: 99  Weight: 195 lb (88.5 kg)  Body mass index is 29.65 kg/m.  Total Weight Gain:37 lb (16.8 kg)         Physical Examination:   General appearance: Well appearing, and in Brittany [name] distress  Mental status: Alert, oriented to person, place, and time  Skin: Warm & dry  Cardiovascular: Normal heart rate noted  Respiratory: Normal respiratory effort, Brittany [name] distress  Abdomen: Soft, gravid, nontender, AGA with Fundal Height: 33 cm  Pelvic: Cervical exam deferred           Extremities: Edema: Trace  Fetal Status: Fetal Heart Rate (bpm): 150  Movement: Present   Brittany [name] results found for this or any previous  visit (from the past 24 hours).  Assessment & Plan:  High-risk pregnancy of a 43 y.o., [id number] at [redacted]w[redacted]d with an Estimated Date of Delivery: 05/07/24   1. Supervision of other high risk pregnancy, antepartum -Anticipatory guidance for upcoming appts. -Patient to schedule next appt in 2 weeks for an in-person visit.  2. [redacted] weeks gestation of pregnancy -Doing well. -Briefly discussed BTL including timing and recovery.  -Instructed to sign BTL papers today and to discuss r/b of procedure with MD at next visit. -Further informed that signing papers does not obligate patient to procedure, but she is definitive in desire to avoid future conception!  3. Systemic lupus erythematosus, unspecified SLE type, unspecified organ involvement status (HCC) -Informed that SLE and maternal age makes patient candidate for IOL at or after 39 weeks. -Informed that MFM will decide if IOL is appropriate before this time.      Meds: Brittany [name] orders of the defined types were placed in this encounter.  Labs/procedures today:  Lab Orders  Brittany [name] laboratory test(s) ordered today     Reviewed: Preterm labor symptoms and general obstetric precautions including but not limited to vaginal bleeding, contractions, leaking of fluid and fetal movement were reviewed in detail with the patient.  All questions were answered.  Follow-up: Brittany [name] follow-ups on file.  Brittany [name] orders of the defined types were placed in this encounter.  Cherre Robins MSN, CNM 03/11/2024

## 2024-03-13 ENCOUNTER — Other Ambulatory Visit: Payer: Self-pay | Admitting: *Deleted

## 2024-03-13 ENCOUNTER — Ambulatory Visit: Payer: Medicaid Other | Attending: Obstetrics and Gynecology

## 2024-03-13 ENCOUNTER — Ambulatory Visit: Attending: Obstetrics and Gynecology | Admitting: Obstetrics and Gynecology

## 2024-03-13 VITALS — BP 120/78

## 2024-03-13 DIAGNOSIS — E669 Obesity, unspecified: Secondary | ICD-10-CM | POA: Diagnosis not present

## 2024-03-13 DIAGNOSIS — D6862 Lupus anticoagulant syndrome: Secondary | ICD-10-CM | POA: Diagnosis not present

## 2024-03-13 DIAGNOSIS — Z3A32 32 weeks gestation of pregnancy: Secondary | ICD-10-CM | POA: Diagnosis not present

## 2024-03-13 DIAGNOSIS — O99213 Obesity complicating pregnancy, third trimester: Secondary | ICD-10-CM

## 2024-03-13 DIAGNOSIS — O09522 Supervision of elderly multigravida, second trimester: Secondary | ICD-10-CM | POA: Insufficient documentation

## 2024-03-13 DIAGNOSIS — O99119 Other diseases of the blood and blood-forming organs and certain disorders involving the immune mechanism complicating pregnancy, unspecified trimester: Secondary | ICD-10-CM | POA: Insufficient documentation

## 2024-03-13 DIAGNOSIS — O09523 Supervision of elderly multigravida, third trimester: Secondary | ICD-10-CM | POA: Diagnosis not present

## 2024-03-13 DIAGNOSIS — O99891 Other specified diseases and conditions complicating pregnancy: Secondary | ICD-10-CM | POA: Diagnosis not present

## 2024-03-13 DIAGNOSIS — M329 Systemic lupus erythematosus, unspecified: Secondary | ICD-10-CM | POA: Diagnosis not present

## 2024-03-13 DIAGNOSIS — M3219 Other organ or system involvement in systemic lupus erythematosus: Secondary | ICD-10-CM

## 2024-03-13 DIAGNOSIS — O09899 Supervision of other high risk pregnancies, unspecified trimester: Secondary | ICD-10-CM | POA: Diagnosis not present

## 2024-03-13 DIAGNOSIS — O280 Abnormal hematological finding on antenatal screening of mother: Secondary | ICD-10-CM | POA: Insufficient documentation

## 2024-03-13 NOTE — Progress Notes (Signed)
  Maternal-Fetal Medicine Consultation Name: Brittany [name] MRN: [id number]  G6 P1133 at 32w 3d gestation.  Patient is systemic lupus erythematosus with acute flares.  She was admitted 10 days ago and was discharged 2 days later.  Patient was complaining of fatigue, weakness and dizziness.  Past medical history is also significant for sarcoidosis.  On 03/04/2024, patient was seen by her rheumatologist, Dr. Hilda Lias.  Complements C3 and C4 levels are significantly reduced.  Urinalysis showed microalbuminuria.  Serum creatinine levels are within normal range. Patient had echocardiography that showed normal ejection fraction and mild pericardial effusion.   She takes prednisone 20 mg daily, Plaquenil and, recently, azathioprine 50 mg was added by her rheumatologist. Patient is also being followed by her nephrologist and hematologist.  Patient still feels slightly unwell but has no shortness of breath.  Blood pressure today at our office is 121/74 mmHg. She had fetal echocardiography that was reported as normal.  Ultrasound Amniotic fluid is normal and good fetal activity seen.  Transverse lie and head to maternal left.  Antenatal testing is reassuring.  BPP 8/8.  I reassured the patient of ultrasound findings. Patient does acute flares, and I encouraged her to take prednisone regularly since azathioprine has a delayed onset of action.  Azathioprine can be safely given in pregnancy.  I encouraged her to come to the hospital if she has shortness of breath or if her flare worsens.  Inpatient management may be considered till delivery or until she becomes well.  We discussed timing of delivery.  It is reasonable to deliver at [redacted] weeks gestation.  Patient agrees with my recommendation to deliver at [redacted] weeks gestation.  Earlier delivery may be considered if she has significant hypertension.  Patient does not seem to have lupus nephritis.  Recommendations -Continue weekly antenatal  testing. -Consider delivery at [redacted] weeks gestation. Consultation including face-to-face (more than 50%) counseling 20 minutes.

## 2024-03-20 ENCOUNTER — Ambulatory Visit: Attending: Obstetrics and Gynecology | Admitting: Maternal & Fetal Medicine

## 2024-03-20 ENCOUNTER — Other Ambulatory Visit

## 2024-03-20 ENCOUNTER — Ambulatory Visit (HOSPITAL_BASED_OUTPATIENT_CLINIC_OR_DEPARTMENT_OTHER)

## 2024-03-20 ENCOUNTER — Ambulatory Visit: Payer: Medicaid Other

## 2024-03-20 DIAGNOSIS — O09213 Supervision of pregnancy with history of pre-term labor, third trimester: Secondary | ICD-10-CM | POA: Diagnosis not present

## 2024-03-20 DIAGNOSIS — Z3A33 33 weeks gestation of pregnancy: Secondary | ICD-10-CM

## 2024-03-20 DIAGNOSIS — O280 Abnormal hematological finding on antenatal screening of mother: Secondary | ICD-10-CM | POA: Diagnosis not present

## 2024-03-20 DIAGNOSIS — M329 Systemic lupus erythematosus, unspecified: Secondary | ICD-10-CM | POA: Diagnosis not present

## 2024-03-20 DIAGNOSIS — O99891 Other specified diseases and conditions complicating pregnancy: Secondary | ICD-10-CM | POA: Insufficient documentation

## 2024-03-20 DIAGNOSIS — O09523 Supervision of elderly multigravida, third trimester: Secondary | ICD-10-CM

## 2024-03-20 DIAGNOSIS — Z3689 Encounter for other specified antenatal screening: Secondary | ICD-10-CM | POA: Insufficient documentation

## 2024-03-20 DIAGNOSIS — O09522 Supervision of elderly multigravida, second trimester: Secondary | ICD-10-CM

## 2024-03-20 NOTE — Progress Notes (Signed)
 Patient information  Patient Name: Brittany [name]  Patient MRN:   [id number]  Referring practice: MFM Referring Provider: [facility] ([facility])  MFM CONSULT  Brittany [name] is a 43 y.o. [id number] at [redacted]w[redacted]d here for ultrasound and consultation. Patient Active Problem List   Diagnosis Date Noted   Multigravida of advanced maternal age in third trimester 02/29/2024   Supervision of high risk pregnancy in third trimester 02/29/2024   Lupus anticoagulant complicating pregnancy, antepartum (HCC) 02/28/2024   Abnormal maternal serum screening test-AFP Tetra Pos  DSR 1:27 01/02/2024   Supervision of other high risk pregnancy, antepartum 12/18/2023   Sarcoidosis 10/22/2018   Systemic lupus erythematosus (HCC) 05/23/2016    Brittany [name] is doing well today with no acute concerns.  RE SLE: Patient is doing well on Plaquenil.  She occasionally has dizzy spells or lightheadedness.  She denies chest pain or shortness of breath.  She is going to take a detailed log to current try to eat and drink more consistently.  I encouraged her to report this to her OB provider or go to the MAU with any urgent concerns.  We discussed that delivery timing would likely be around 37 weeks or sooner if indicated.  Sonographic findings Single intrauterine pregnancy. Fetal cardiac activity: Observed. Presentation: Cephalic. Interval fetal anatomy appears normal. Amniotic fluid: Within normal limits.  MVP: 5.38 cm. Placenta: Fundal. BPP: 8/8.   There are limitations of prenatal ultrasound such as the inability to detect certain abnormalities due to poor visualization. Various factors such as fetal position, gestational age and maternal body habitus may increase the difficulty in visualizing the fetal anatomy.    Recommendations -Serial growth ultrasounds every 4 weeks until delivery -Continue weekly antenatal testing -Delivery around [redacted] weeks gestation -Precautions given  regarding lightheadedness.  Consider a neurology referral if this continues  Review of Systems: A review of systems was performed and was negative except per HPI   Vitals and Physical Exam    03/20/2024    8:44 AM 03/13/2024    8:31 AM 03/11/2024    2:41 PM  Vitals with BMI  Weight   195 lbs  BMI   29.66  Systolic 121 120 865  Diastolic 73 78 74  Pulse 97  99    Sitting comfortably on the sonogram table Nonlabored breathing Normal rate and rhythm Abdomen is nontender  Past pregnancies OB History  Gravida Para Term Preterm AB Living  6 2 1 1 3 3   SAB IAB Ectopic Multiple Live Births  2 1  1 3     # Outcome Date GA Lbr Len/2nd Weight Sex Type Anes PTL Lv  6 Current           5A Preterm 04/30/19 [redacted]w[redacted]d 03:35 / 00:18 6 lb 2.4 oz (2.79 kg) M Vag-Spont EPI  LIV  5B Preterm 04/30/19 [redacted]w[redacted]d 03:35 / 00:33 5 lb 15.2 oz (2.7 kg) M Vag-Spont EPI  LIV  4 Term 2000 [redacted]w[redacted]d   F Vag-Spont EPI N LIV  3 SAB           2 SAB           1 IAB              I spent 20 minutes reviewing the patients chart, including labs and images as well as counseling the patient about her medical conditions. Greater than 50% of the time was spent in direct face-to-face patient counseling.  [name]  MFM, [facility]   03/20/2024  10:26 AM

## 2024-03-24 ENCOUNTER — Ambulatory Visit (INDEPENDENT_AMBULATORY_CARE_PROVIDER_SITE_OTHER): Payer: Medicaid Other | Admitting: Obstetrics and Gynecology

## 2024-03-24 VITALS — BP 131/80 | HR 112 | Wt 197.0 lb

## 2024-03-24 DIAGNOSIS — D869 Sarcoidosis, unspecified: Secondary | ICD-10-CM

## 2024-03-24 DIAGNOSIS — O09899 Supervision of other high risk pregnancies, unspecified trimester: Secondary | ICD-10-CM | POA: Diagnosis not present

## 2024-03-24 DIAGNOSIS — M329 Systemic lupus erythematosus, unspecified: Secondary | ICD-10-CM | POA: Diagnosis not present

## 2024-03-24 DIAGNOSIS — Z23 Encounter for immunization: Secondary | ICD-10-CM | POA: Diagnosis not present

## 2024-03-24 DIAGNOSIS — Z3A33 33 weeks gestation of pregnancy: Secondary | ICD-10-CM

## 2024-03-24 NOTE — Progress Notes (Signed)
   PRENATAL VISIT NOTE  Subjective:  Brittany [name] is a 43 y.o. [id number] at [redacted]w[redacted]d being seen today for ongoing prenatal care.  She is currently monitored for the following issues for this high-risk pregnancy and has Systemic lupus erythematosus (HCC); Sarcoidosis; Supervision of other high risk pregnancy, antepartum; Abnormal maternal serum screening test-AFP Tetra Pos  DSR 1:27; Lupus anticoagulant complicating pregnancy, antepartum (HCC); Multigravida of advanced maternal age in third trimester; and Supervision of high risk pregnancy in third trimester on their problem list.  Patient reports fatigue and feeling like baby is dropping .  Contractions: Not present. Vag. Bleeding: None.  Movement: Present. Denies leaking of fluid.  Question regarding if preterm contractions were to start, would her labor be stopped given prior pregnancy with contractions starting at 34 weeks (with twins) and given betamethasone  and meds to slow down labor.  Currently on prednisone  for sarcoid and lupus Requesting TDaP today  Ok w/ scheduling IOL for 37 weeks    The following portions of the patient's history were reviewed and updated as appropriate: allergies, current medications, past family history, past medical history, past social history, past surgical history and problem list.   Objective:   Vitals:   03/24/24 0819  BP: 131/80  Pulse: (!) 112  Weight: 197 lb (89.4 kg)    Fetal Status: Fetal Heart Rate (bpm): 154   Movement: Present     General:  Alert, oriented and cooperative. Patient is in no acute distress.  Skin: Skin is warm and dry. No rash noted.   Cardiovascular: Normal heart rate noted  Respiratory: Normal respiratory effort, no problems with respiration noted  Abdomen: Soft, gravid, appropriate for gestational age.  Pain/Pressure: Present     Pelvic: Cervical exam deferred        Extremities: Normal range of motion.  Edema: Trace  Mental Status: Normal mood and affect. Normal  behavior. Normal judgment and thought content.   Assessment and Plan:  Pregnancy: [id number] at [redacted]w[redacted]d 1. Supervision of other high risk pregnancy, antepartum (Primary) FHR wnl 3/28 US  36%ile - Tdap vaccine greater than or equal to 7yo IM  2. Systemic lupus erythematosus, unspecified SLE type, unspecified organ involvement status (HCC) 3. Sarcoidosis Followed by MFM Plaquenil  and prednisone   Growths every 4 weeks, weekly testing and visits - last BPP 8/8  4. [redacted] weeks gestation of pregnancy Will scheduled IOL for 37 weeks per MFM - understands if there is development of HTN or other symptoms, may be induces sooner  Preterm labor symptoms and general obstetric precautions including but not limited to vaginal bleeding, contractions, leaking of fluid and fetal movement were reviewed in detail with the patient. Please refer to After Visit Summary for other counseling recommendations.   No follow-ups on file.  Future Appointments  Date Time Provider Department Center  03/27/2024  8:00 AM [facility] PROVIDER 1 [facility]  03/27/2024  8:30 AM [facility] US2 [facility]  04/03/2024  2:00 PM [facility] PROVIDER 1 [facility]  04/03/2024  2:30 PM [facility] US1 [facility]  04/07/2024  9:55 AM Anyanwu, Kathrine Paris, MD [facility] None  04/10/2024  9:00 AM [facility] PROVIDER 1 [facility]  04/10/2024  9:30 AM [facility] US5 [facility]  04/14/2024  9:55 AM Dunn, Jodelle Mungo, MD [facility] None  04/21/2024  9:55 AM Anyanwu, Kathrine Paris, MD [facility] None  04/29/2024  9:55 AM Zelma Hidden, FNP [facility] None    Kiki Pelton, MD

## 2024-03-24 NOTE — Addendum Note (Signed)
 Addended by: Ardean Beat A on: 03/24/2024 09:33 AM   Modules accepted: Orders

## 2024-03-27 ENCOUNTER — Ambulatory Visit (HOSPITAL_BASED_OUTPATIENT_CLINIC_OR_DEPARTMENT_OTHER): Admitting: Obstetrics and Gynecology

## 2024-03-27 ENCOUNTER — Ambulatory Visit: Payer: Medicaid Other | Attending: Obstetrics and Gynecology

## 2024-03-27 DIAGNOSIS — O09523 Supervision of elderly multigravida, third trimester: Secondary | ICD-10-CM

## 2024-03-27 DIAGNOSIS — O09522 Supervision of elderly multigravida, second trimester: Secondary | ICD-10-CM | POA: Diagnosis not present

## 2024-03-27 DIAGNOSIS — Z3A34 34 weeks gestation of pregnancy: Secondary | ICD-10-CM

## 2024-03-27 DIAGNOSIS — M329 Systemic lupus erythematosus, unspecified: Secondary | ICD-10-CM

## 2024-03-27 DIAGNOSIS — O99891 Other specified diseases and conditions complicating pregnancy: Secondary | ICD-10-CM

## 2024-03-27 NOTE — Progress Notes (Signed)
 Maternal-Fetal Medicine Consultation Name: Brittany [name], Brittany [name] MRN: [id number]  G6 P1133 at 34w 1d gestation. Systemic lupus erythematosus with acute flares.  Patient takes azathioprine, prednisone  and Plaquenil .  She does not have shortness of breath or chest pain.  Blood pressure today at our office is 127/78 mmHg. She does not have lupus nephritis.  Ultrasound Amniotic fluid is normal and good fetal activity seen.  Fetal growth is appropriate for gestational age.  Antenatal testing is reassuring.  BPP 8/8.  I reassured the patient of the findings.  I discussed the safety profile of azathioprine in pregnancy and that it is safe to breast-feed while taking this medication. Preterm delivery is more common in SLE. We will continue weekly antenatal testing. We recommend delivery at [redacted] weeks gestation.  Recommendations - Continue weekly antenatal testing till delivery.

## 2024-03-28 ENCOUNTER — Encounter (HOSPITAL_COMMUNITY): Payer: Self-pay

## 2024-03-28 ENCOUNTER — Telehealth (HOSPITAL_COMMUNITY): Payer: Self-pay | Admitting: *Deleted

## 2024-03-28 NOTE — Telephone Encounter (Signed)
 Preadmission screen

## 2024-03-31 DIAGNOSIS — Z79899 Other long term (current) drug therapy: Secondary | ICD-10-CM | POA: Diagnosis not present

## 2024-03-31 DIAGNOSIS — M329 Systemic lupus erythematosus, unspecified: Secondary | ICD-10-CM | POA: Diagnosis not present

## 2024-03-31 DIAGNOSIS — O99891 Other specified diseases and conditions complicating pregnancy: Secondary | ICD-10-CM | POA: Diagnosis not present

## 2024-03-31 DIAGNOSIS — Z3A34 34 weeks gestation of pregnancy: Secondary | ICD-10-CM | POA: Diagnosis not present

## 2024-04-01 ENCOUNTER — Telehealth (HOSPITAL_COMMUNITY): Payer: Self-pay | Admitting: *Deleted

## 2024-04-01 NOTE — Telephone Encounter (Signed)
 Preadmission screen

## 2024-04-02 ENCOUNTER — Ambulatory Visit: Admitting: Family Medicine

## 2024-04-02 ENCOUNTER — Encounter (HOSPITAL_COMMUNITY): Payer: Self-pay | Admitting: *Deleted

## 2024-04-02 ENCOUNTER — Telehealth (HOSPITAL_COMMUNITY): Payer: Self-pay | Admitting: *Deleted

## 2024-04-02 ENCOUNTER — Other Ambulatory Visit (HOSPITAL_COMMUNITY)
Admission: RE | Admit: 2024-04-02 | Discharge: 2024-04-02 | Disposition: A | Discharge status: Home / Self Care | Source: Ambulatory Visit | Attending: Family Medicine | Admitting: Family Medicine

## 2024-04-02 VITALS — BP 136/70 | HR 110 | Wt 200.0 lb

## 2024-04-02 DIAGNOSIS — Z3493 Encounter for supervision of normal pregnancy, unspecified, third trimester: Secondary | ICD-10-CM | POA: Insufficient documentation

## 2024-04-02 DIAGNOSIS — M3219 Other organ or system involvement in systemic lupus erythematosus: Secondary | ICD-10-CM

## 2024-04-02 DIAGNOSIS — Z3A35 35 weeks gestation of pregnancy: Secondary | ICD-10-CM | POA: Insufficient documentation

## 2024-04-02 DIAGNOSIS — O09899 Supervision of other high risk pregnancies, unspecified trimester: Secondary | ICD-10-CM | POA: Diagnosis not present

## 2024-04-02 DIAGNOSIS — O09523 Supervision of elderly multigravida, third trimester: Secondary | ICD-10-CM | POA: Diagnosis not present

## 2024-04-02 DIAGNOSIS — O280 Abnormal hematological finding on antenatal screening of mother: Secondary | ICD-10-CM | POA: Diagnosis not present

## 2024-04-02 NOTE — Telephone Encounter (Signed)
 Preadmission screen

## 2024-04-02 NOTE — Progress Notes (Signed)
   PRENATAL VISIT NOTE  Subjective:  Brittany [name] is a 43 y.o. [id number] at [redacted]w[redacted]d being seen today for ongoing prenatal care.  She is currently monitored for the following issues for this high-risk pregnancy and has Systemic lupus erythematosus (HCC); Sarcoidosis; Supervision of other high risk pregnancy, antepartum; Abnormal maternal serum screening test-AFP Tetra Pos  DSR 1:27; Lupus anticoagulant complicating pregnancy, antepartum (HCC); Multigravida of advanced maternal age in third trimester; and Supervision of high risk pregnancy in third trimester on their problem list.  Patient reports  having a lot of skin problems due to lupus. There isn't anything else that rheumatology can do until after delivery .  Contractions: Irritability. Vag. Bleeding: None.  Movement: Present. Denies leaking of fluid.   The following portions of the patient's history were reviewed and updated as appropriate: allergies, current medications, past family history, past medical history, past social history, past surgical history and problem list.   Objective:   Vitals:   04/02/24 0918  BP: 136/70  Pulse: (!) 110  Weight: 200 lb (90.7 kg)    Fetal Status: Fetal Heart Rate (bpm): 143   Movement: Present     General:  Alert, oriented and cooperative. Patient is in no acute distress.  Skin: Skin is warm and dry. No rash noted.   Cardiovascular: Normal heart rate noted  Respiratory: Normal respiratory effort, no problems with respiration noted  Abdomen: Soft, gravid, appropriate for gestational age.  Pain/Pressure: Present     Pelvic: Cervical exam deferred        Extremities: Normal range of motion.  Edema: Trace  Mental Status: Normal mood and affect. Normal behavior. Normal judgment and thought content.   Assessment and Plan:  Pregnancy: [id number] at [redacted]w[redacted]d 1. [redacted] weeks gestation of pregnancy (Primary) - Culture, beta strep (group b only) - GC/Chlamydia probe amp ([place])not at [facility]  2.  Supervision of other high risk pregnancy, antepartum FHT normal  3. Other systemic lupus erythematosus with other organ involvement (HCC) Had laps Monday - SCr normal.  On ASA On prednisone  20mg  and plaquinel Delivery in 2 weeks or if BP starts to increase  4. Multigravida of advanced maternal age in third trimester   5. Abnormal maternal serum screening test-AFP Tetra Pos  DSR 1:27   Preterm labor symptoms and general obstetric precautions including but not limited to vaginal bleeding, contractions, leaking of fluid and fetal movement were reviewed in detail with the patient. Please refer to After Visit Summary for other counseling recommendations.   No follow-ups on file.  Future Appointments  Date Time Provider Department Center  04/03/2024  2:00 PM [facility] PROVIDER 1 [facility]  04/03/2024  2:30 PM [facility] US1 [facility]  04/07/2024  9:55 AM Anyanwu, Kathrine Paris, MD [facility] None  04/10/2024  9:00 AM [facility] PROVIDER 1 [facility]  04/10/2024  9:30 AM [facility] US5 [facility]  04/14/2024  9:55 AM [place] At[place], Jodelle Mungo, MD [facility] None  04/16/2024  6:30 AM [facility] SCHED ROOM [facility] None  05/28/2024 10:55 AM Alwyn Cordner J, DO [facility] None    Shahzain Kiester J Babygirl Trager, DO

## 2024-04-03 ENCOUNTER — Ambulatory Visit: Attending: Obstetrics and Gynecology

## 2024-04-03 ENCOUNTER — Other Ambulatory Visit: Payer: Self-pay | Admitting: Obstetrics and Gynecology

## 2024-04-03 ENCOUNTER — Ambulatory Visit: Admitting: Maternal & Fetal Medicine

## 2024-04-03 VITALS — BP 111/62 | HR 107

## 2024-04-03 DIAGNOSIS — O09213 Supervision of pregnancy with history of pre-term labor, third trimester: Secondary | ICD-10-CM | POA: Diagnosis not present

## 2024-04-03 DIAGNOSIS — O09899 Supervision of other high risk pregnancies, unspecified trimester: Secondary | ICD-10-CM | POA: Diagnosis not present

## 2024-04-03 DIAGNOSIS — O09523 Supervision of elderly multigravida, third trimester: Secondary | ICD-10-CM

## 2024-04-03 DIAGNOSIS — O09893 Supervision of other high risk pregnancies, third trimester: Secondary | ICD-10-CM | POA: Diagnosis not present

## 2024-04-03 DIAGNOSIS — M3219 Other organ or system involvement in systemic lupus erythematosus: Secondary | ICD-10-CM

## 2024-04-03 DIAGNOSIS — Z3A35 35 weeks gestation of pregnancy: Secondary | ICD-10-CM

## 2024-04-03 DIAGNOSIS — O99891 Other specified diseases and conditions complicating pregnancy: Secondary | ICD-10-CM

## 2024-04-03 LAB — GC/CHLAMYDIA PROBE AMP (~~LOC~~) NOT AT ARMC
Chlamydia: NEGATIVE
Comment: NEGATIVE
Comment: NORMAL
Neisseria Gonorrhea: NEGATIVE

## 2024-04-03 NOTE — Progress Notes (Signed)
   Patient information  Patient Name: Brittany [name]  Patient MRN:   [id number]  Referring practice: MFM Referring Provider: [facility])  MFM CONSULT  Brittany [name] is a 43 y.o. [id number] at [redacted]w[redacted]d here for ultrasound and consultation. Patient Active Problem List   Diagnosis Date Noted   Multigravida of advanced maternal age in third trimester 02/29/2024   Supervision of high risk pregnancy in third trimester 02/29/2024   Lupus anticoagulant complicating pregnancy, antepartum (HCC) 02/28/2024   Abnormal maternal serum screening test-AFP Tetra Pos  DSR 1:27 01/02/2024   Supervision of other high risk pregnancy, antepartum 12/18/2023   Sarcoidosis 10/22/2018   Systemic lupus erythematosus (HCC) 05/23/2016    Brittany [name] is doing well today with no acute concerns.  The patient verbalized that she has some fatigue and shortness of breath today.  She reports that this has been a chronic issue.  She denies chest pain or palpitations.  I asked her if she felt that her symptoms were significant enough from her baseline to go to the MAU.  The patient verbalized that she does not feel like it is significant enough to warrant emergent attention.  I gave her precautions to go to the MAU with any other concerns or if her symptoms worsen.  Sonographic findings Single intrauterine pregnancy. Fetal cardiac activity: Observed. Presentation: Cephalic. Interval fetal anatomy appears normal. Amniotic fluid: Within normal limits.  MVP: 5.5 cm. Placenta: Fundal. BPP: 8/8.   Recommendations 1. Continue weekly antenatal testing until delivery 2. Growth ultrasounds every 4 weeks until delivery  3. Delivery around [redacted] weeks gestation or sooner if clinically indicated     Review of Systems: A review of systems was performed and was negative except per HPI   Vitals and Physical Exam    04/03/2024    2:23 PM 04/02/2024    9:18 AM 03/27/2024     8:29 AM  Vitals with BMI  Weight  200 lbs   Systolic 111 136 147  Diastolic 62 70 78  Pulse 107 110 115    Sitting comfortably on the sonogram table Nonlabored breathing Normal rate and rhythm Abdomen is nontender  Past pregnancies OB History  Gravida Para Term Preterm AB Living  6 2 1 1 3 3   SAB IAB Ectopic Multiple Live Births  2 1  1 3     # Outcome Date GA Lbr Len/2nd Weight Sex Type Anes PTL Lv  6 Current           5A Preterm 04/30/19 [redacted]w[redacted]d 03:35 / 00:18 6 lb 2.4 oz (2.79 kg) M Vag-Spont EPI  LIV  5B Preterm 04/30/19 108w2d 03:35 / 00:33 5 lb 15.2 oz (2.7 kg) M Vag-Spont EPI  LIV  4 Term 2000 [redacted]w[redacted]d   F Vag-Spont EPI N LIV  3 SAB           2 SAB           1 IAB              I spent 20 minutes reviewing the patients chart, including labs and images as well as counseling the patient about her medical conditions. Greater than 50% of the time was spent in direct face-to-face patient counseling.  Penney Bowling  MFM, [facility]   04/03/2024  4:58 PM

## 2024-04-06 ENCOUNTER — Encounter (HOSPITAL_COMMUNITY): Payer: Self-pay | Admitting: Obstetrics & Gynecology

## 2024-04-06 ENCOUNTER — Inpatient Hospital Stay (HOSPITAL_COMMUNITY)
Admission: AD | Admit: 2024-04-06 | Discharge: 2024-04-09 | DRG: 797 | Disposition: A | Discharge status: Home / Self Care | Attending: Obstetrics and Gynecology | Admitting: Obstetrics and Gynecology

## 2024-04-06 ENCOUNTER — Other Ambulatory Visit: Payer: Self-pay

## 2024-04-06 DIAGNOSIS — O280 Abnormal hematological finding on antenatal screening of mother: Secondary | ICD-10-CM

## 2024-04-06 DIAGNOSIS — E876 Hypokalemia: Secondary | ICD-10-CM | POA: Diagnosis present

## 2024-04-06 DIAGNOSIS — O1413 Severe pre-eclampsia, third trimester: Principal | ICD-10-CM | POA: Diagnosis present

## 2024-04-06 DIAGNOSIS — Z91040 Latex allergy status: Secondary | ICD-10-CM

## 2024-04-06 DIAGNOSIS — Z302 Encounter for sterilization: Secondary | ICD-10-CM

## 2024-04-06 DIAGNOSIS — D6862 Lupus anticoagulant syndrome: Secondary | ICD-10-CM | POA: Diagnosis present

## 2024-04-06 DIAGNOSIS — O99214 Obesity complicating childbirth: Secondary | ICD-10-CM | POA: Diagnosis present

## 2024-04-06 DIAGNOSIS — Z3A35 35 weeks gestation of pregnancy: Secondary | ICD-10-CM

## 2024-04-06 DIAGNOSIS — O09899 Supervision of other high risk pregnancies, unspecified trimester: Secondary | ICD-10-CM

## 2024-04-06 DIAGNOSIS — O9912 Other diseases of the blood and blood-forming organs and certain disorders involving the immune mechanism complicating childbirth: Secondary | ICD-10-CM | POA: Diagnosis present

## 2024-04-06 DIAGNOSIS — O99119 Other diseases of the blood and blood-forming organs and certain disorders involving the immune mechanism complicating pregnancy, unspecified trimester: Secondary | ICD-10-CM

## 2024-04-06 DIAGNOSIS — M3214 Glomerular disease in systemic lupus erythematosus: Secondary | ICD-10-CM | POA: Diagnosis present

## 2024-04-06 DIAGNOSIS — O09523 Supervision of elderly multigravida, third trimester: Secondary | ICD-10-CM

## 2024-04-06 DIAGNOSIS — Z7982 Long term (current) use of aspirin: Secondary | ICD-10-CM

## 2024-04-06 DIAGNOSIS — D869 Sarcoidosis, unspecified: Secondary | ICD-10-CM | POA: Diagnosis present

## 2024-04-06 DIAGNOSIS — O9902 Anemia complicating childbirth: Secondary | ICD-10-CM | POA: Diagnosis present

## 2024-04-06 DIAGNOSIS — Z8249 Family history of ischemic heart disease and other diseases of the circulatory system: Secondary | ICD-10-CM

## 2024-04-06 DIAGNOSIS — M3219 Other organ or system involvement in systemic lupus erythematosus: Secondary | ICD-10-CM

## 2024-04-06 DIAGNOSIS — O1414 Severe pre-eclampsia complicating childbirth: Principal | ICD-10-CM | POA: Diagnosis present

## 2024-04-06 DIAGNOSIS — O0993 Supervision of high risk pregnancy, unspecified, third trimester: Secondary | ICD-10-CM

## 2024-04-06 LAB — CULTURE, BETA STREP (GROUP B ONLY): Strep Gp B Culture: NEGATIVE

## 2024-04-06 MED ORDER — DIPHENHYDRAMINE HCL 25 MG PO CAPS
25.0000 mg | ORAL_CAPSULE | Freq: Once | ORAL | Status: AC
  Administered 2024-04-06: 25 mg via ORAL
  Filled 2024-04-06: qty 1

## 2024-04-06 MED ORDER — METOCLOPRAMIDE HCL 10 MG PO TABS
10.0000 mg | ORAL_TABLET | Freq: Once | ORAL | Status: AC
  Administered 2024-04-06: 10 mg via ORAL
  Filled 2024-04-06: qty 1

## 2024-04-06 MED ORDER — CYCLOBENZAPRINE HCL 5 MG PO TABS
10.0000 mg | ORAL_TABLET | Freq: Once | ORAL | Status: AC
  Administered 2024-04-06: 10 mg via ORAL
  Filled 2024-04-06: qty 2

## 2024-04-06 NOTE — MAU Note (Signed)
 Pt stated she checked her BP at home and the "top number" was over 150 and the "buttom number was a little over 100", also c/o of headache,  lightheaded and a lot of mucous. Took tylenol  mid day and does not know how many mg.

## 2024-04-06 NOTE — MAU Provider Note (Incomplete)
 MAU Provider Note  Chief Complaint: Hypertension   Event Date/Time   First Provider Initiated Contact with Patient 04/06/24 2328      SUBJECTIVE HPI: Brittany [name] is a 43 y.o. [id number] at [redacted]w[redacted]d by midtrimester ultrasound who presents to maternity admissions reporting HA and high BP. Pregnancy c/b lupus, sarcoidosis. Receives [facility] with [employer].  Patient presents with headache. Mostly on R side. Throbbing in nature. Tylenol  minimally helpful. Is sensitive to light and noise. Additionally had an elevated BP at home. Has not had problems with HTN but has been monitoring at home given high risk for this complication. Denies vision changes, extremity swelling, RUQ pain, CP, SOB. +FM. Denies contractions, vaginal bleeding, urinary symptoms. Is having some mucuosy discharge but no leaking of fluid.   HPI  Past Medical History:  Diagnosis Date  . Abnormal Pap smear of cervix 08/27/2020   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    . Allergic conjunctivitis 02/28/2018  . Allergic urticaria 02/28/2018  . Anemia   . Anemia, unspecified 02/04/2018  . Lupus   . Sarcoidosis   . Seasonal and perennial allergic rhinitis 02/28/2018  . Urticaria    Past Surgical History:  Procedure Laterality Date  . BIOPSY THYROID     . BREAST BIOPSY Right 10/09/2022   MM RT BREAST BX W LOC DEV 1ST LESION IMAGE BX SPEC STEREO GUIDE 10/09/2022 [facility]  . BREAST BIOPSY Right 10/25/2022   MM RT BREAST BX W LOC DEV 1ST LESION IMAGE BX SPEC STEREO GUIDE 10/25/2022 [facility]  . DILATION AND CURETTAGE OF UTERUS    . DILATION AND CURETTAGE, DIAGNOSTIC / THERAPEUTIC  01/04/2018   Social History   Socioeconomic History  . Marital status: Married    Spouse name: Not on file  . Number of children: Not on file  . Years of education: Not on file  . Highest education level: Not on file  Occupational  History  . Not on file  Tobacco Use  . Smoking status: Never  . Smokeless tobacco: Never  Vaping Use  . Vaping status: Never Used  Substance and Sexual Activity  . Alcohol use: Not Currently    Alcohol/week: 1.0 standard drink of alcohol    Types: 1 Glasses of wine per week    Comment: social drinker  . Drug use: Never  . Sexual activity: Yes    Birth control/protection: None  Other Topics Concern  . Not on file  Social History Narrative  . Not on file   Social Drivers of Health   Financial Resource Strain: Not on file  Food Insecurity: No Food Insecurity (04/06/2024)   Hunger Vital Sign   . Worried About Programme researcher, broadcasting/film/video in the Last Year: Never true   . Ran Out of Food in the Last Year: Never true  Transportation Needs: No Transportation Needs (02/28/2024)   PRAPARE - Transportation   . Lack of Transportation (Medical): No   . Lack of Transportation (Non-Medical): No  Physical Activity: Not on file  Stress: Not on file  Social Connections: Not on file  Intimate Partner Violence: Not At Risk (02/28/2024)   Humiliation, Afraid, Rape, and Kick questionnaire   . Fear of Current or Ex-Partner: No   . Emotionally Abused: No   . Physically Abused: No   . Sexually Abused: No   No current facility-administered  medications on file prior to encounter.   Current Outpatient Medications on File Prior to Encounter  Medication Sig Dispense Refill  . acetaminophen  (TYLENOL ) 325 MG tablet Take 2 tablets (650 mg total) by mouth every 4 (four) hours as needed (for pain scale < 4). 30 tablet 0  . aspirin EC 81 MG tablet Take 81 mg by mouth daily. Swallow whole.    . Cyanocobalamin  (B-12 COMPLIANCE INJECTION IJ) Inject as directed. (Patient not taking: Reported on 04/02/2024)    . cyclobenzaprine  (FLEXERIL ) 5 MG tablet Take 1-2 tablets (5-10 mg total) by mouth 3 (three) times daily as needed for muscle spasms. 20 tablet 0  . famotidine  (PEPCID ) 40 MG tablet Take 1 tablet (40 mg total) by  mouth daily. 30 tablet 0  . hydroxychloroquine  (PLAQUENIL ) 200 MG tablet Take by mouth.    . hydrOXYzine  (ATARAX ) 25 MG tablet Take 1 tablet (25 mg total) by mouth 3 (three) times daily as needed for itching. 30 tablet 0  . loratadine  (CLARITIN ) 10 MG tablet Take 1 tablet (10 mg total) by mouth daily. 30 tablet 0  . ondansetron  (ZOFRAN -ODT) 4 MG disintegrating tablet Take 1 tablet (4 mg total) by mouth every 8 (eight) hours as needed for nausea or vomiting. 20 tablet 0  . predniSONE  (DELTASONE ) 20 MG tablet Take 20 mg by mouth daily with breakfast.    . Prenatal Vit-Fe Fumarate-FA (MULTIVITAMIN-PRENATAL) 27-0.8 MG TABS tablet Take 1 tablet by mouth daily at 12 noon.     Allergies  Allergen Reactions  . Latex Hives  . Strawberry (Diagnostic) Rash    ROS:  Pertinent positives/negatives listed above.  I have reviewed patient's Past Medical Hx, Surgical Hx, Family Hx, Social Hx, medications and allergies.   Physical Exam  Patient Vitals for the past 24 hrs:  BP Temp Temp src Pulse Height Weight  04/06/24 2100 124/77 (!) 97.5 F (36.4 C) Oral (!) 107 5\' 8"  (1.727 m) 90 kg   Constitutional: Well-developed, well-nourished female. Appears uncomfortable Skin: Significant malar rash and rash on finger tips Cardiovascular: normal rate Respiratory: normal effort GI: Abd soft, non-tender MS: Extremities nontender, no edema, normal ROM Neurologic: Alert and oriented x 4.  No focal deficits.  Logical and linear thought process GU: Neg CVAT  FHT:  Baseline 150 , moderate variability, accelerations present, no decelerations Contractions: irregular/irritability  LAB RESULTS No results found for this or any previous visit (from the past 24 hours).  --/--/A POS (03/27 1234)  IMAGING US  MFM FETAL BPP WO NON STRESS Result Date: 04/03/2024 ----------------------------------------------------------------------  OBSTETRICS REPORT                       (Signed Final 04/03/2024 05:01 pm)  ---------------------------------------------------------------------- Patient Info  ID #:       [id number]                          D.O.B.:  10/10/1981 (43 yrs)(F)  Name:       Brittany HAVERFIELD [facility]-                Visit Date: 04/03/2024 02:38 pm              [name] ---------------------------------------------------------------------- Performed By  Attending:        Penney Bowling DO       Ref. Address:     [address]  [place], [place]                                                             [address]  Performed By:     Randal Bury BS,       [facility]                    RDMS, RVT                                [facility]  Referred By:      Malka Sea                    DO ---------------------------------------------------------------------- Orders  #  Description                           Code        Ordered By  1  US  MFM FETAL BPP WO NON               76819.01    RAVI [facility]     STRESS ----------------------------------------------------------------------  #  Order #                     Accession #                Episode #  1  [id number]                   [id number]                 [id number] ---------------------------------------------------------------------- Indications  [redacted] weeks gestation of pregnancy                Z3A.35  Systemic lupus complicating pregnancy,         O26.893, M32.9  third trimester (prednisone , azathioprine,  plaquenil )  Advanced maternal age multigravida 69+,        O65.523  third trimester  Poor obstetric history: Previous preterm       O09.219  delivery, antepartum (36w twins)  LR NIPS - Female; no GDM  Encounter for other antenatal screening        Z36.2  follow-up ---------------------------------------------------------------------- Fetal Evaluation  Num Of  Fetuses:         1  Fetal Heart Rate(bpm):  154  Cardiac Activity:       Observed  Presentation:  Cephalic  Placenta:               Fundal  P. Cord Insertion:      Previously seen  Amniotic Fluid  AFI FV:      Within normal limits  AFI Sum(cm)     %Tile       Largest Pocket(cm)  15.3            55          5.5  RUQ(cm)       RLQ(cm)       LUQ(cm)        LLQ(cm)  4             2.37          5.5            3.43 ---------------------------------------------------------------------- Biophysical Evaluation  Amniotic F.V:   Within normal limits       F. Tone:        Observed  F. Movement:    Observed                   Score:          8/8  F. Breathing:   Observed ---------------------------------------------------------------------- OB History  Gravidity:    6         Term:   1        Prem:   1        SAB:   2  TOP:          1       Ectopic:  0        Living: 3 ---------------------------------------------------------------------- Gestational Age  Best:          35w 1d     Det. By:  Previous Ultrasound      EDD:   05/07/24                                      (12/14/23) ---------------------------------------------------------------------- Anatomy  Ventricles:            Appears normal         Stomach:                Appears normal, left                                                                        sided  Heart:                 Appears normal         Kidneys:                Appear normal                         (4CH, axis, and                         situs)  Diaphragm:             Appears normal         Bladder:  Appears normal  Other:  Technically difficult due to advanced GA and fetal position. ---------------------------------------------------------------------- Cervix Uterus Adnexa  Cervix  Not visualized (advanced GA >24wks)  Uterus  Single fibroid noted, see table below.  Right Ovary  Not visualized.  Left Ovary  Within normal limits.  Cul De Sac  No free fluid seen.  Adnexa  No  abnormality visualized ---------------------------------------------------------------------- Comments  Devlyn A [name] is doing well today with no  acute concerns.  The patient verbalized that she has some fatigue and  shortness of breath today.  She reports that this has been a  chronic issue.  She denies chest pain or palpitations.  I asked  her if she felt that her symptoms were significant enough from  her baseline to go to the MAU.  The patient verbalized that  she does not feel like it is significant enough to warrant  emergent attention.  I gave her precautions to go to the MAU  with any other concerns or if her symptoms worsen.  Sonographic findings  Single intrauterine pregnancy.  Fetal cardiac activity: Observed.  Presentation: Cephalic.  Interval fetal anatomy appears normal.  Amniotic fluid: Within normal limits.  MVP: 5.5 cm.  Placenta: Fundal.  BPP: 8/8.  Recommendations  1. Continue weekly antenatal testing until delivery  2. Growth ultrasounds every 4 weeks until delivery  3. Delivery around [redacted] weeks gestation or sooner if clinically  indicated ----------------------------------------------------------------------                 Penney Bowling, DO Electronically Signed Final Report   04/03/2024 05:01 pm ----------------------------------------------------------------------   US  MFM FETAL BPP WO NON STRESS Result Date: 03/27/2024 ----------------------------------------------------------------------  OBSTETRICS REPORT                    (Corrected Final 03/27/2024 12:41 pm) ---------------------------------------------------------------------- Patient Info  ID #:       [id number]                          D.O.B.:  06/29/1981 (43 yrs)(F)  Name:       Brittany CROSHAW [facility]-                Visit Date: 03/27/2024 09:25 am              [name] ---------------------------------------------------------------------- Performed By  Attending:        Cassandria Clever MD        Ref. Address:     [address]                                                              [place], [place]                                                             [address]  Performed By:     Elsie Halo BS      Location:         [facility]  RDMS RVT                                 [facility]  Referred By:      Malka Sea                    DO ---------------------------------------------------------------------- Orders  #  Description                           Code        Ordered By  1  US  MFM FETAL BPP WO NON               76819.01    RAVI SHANKAR     STRESS  2  US  MFM OB FOLLOW UP                   76816.01    RAVI [facility] ----------------------------------------------------------------------  #  Order #                     Accession #                Episode #  1  [id number]                   [id number]                 [id number]  2  [id number]                   [id number]                 [id number] ---------------------------------------------------------------------- Indications  Systemic lupus complicating pregnancy,         O26.893, M32.9  third trimester (prednisone , azathioprine,  plaquenil )  Advanced maternal age multigravida 70+,        O70.523  third trimester  Poor obstetric history: Previous preterm       O09.219  delivery, antepartum (36w twins)  LR NIPS - Female; no GDM  [redacted] weeks gestation of pregnancy                Z3A.34  Encounter for other antenatal screening        Z36.2  follow-up ---------------------------------------------------------------------- Fetal Evaluation  Num Of Fetuses:         1  Fetal Heart Rate(bpm):  144  Cardiac Activity:       Observed  Presentation:           Cephalic  Placenta:               Left lateral  P. Cord Insertion:  Previously seen  Amniotic Fluid  AFI FV:      Within normal limits  AFI Sum(cm)     %Tile       Largest  Pocket(cm)  13.94           48          3.96  RUQ(cm)       RLQ(cm)       LUQ(cm)        LLQ(cm)  2.96          3.52          3.96           3.5 ---------------------------------------------------------------------- Biophysical Evaluation  Amniotic F.V:   Within normal limits       F. Tone:        Observed  F. Movement:    Observed                   Score:          8/8  F. Breathing:   Observed ---------------------------------------------------------------------- Biometry  BPD:        80  mm     G. Age:  32w 1d          5  %    CI:        71.49   %    70 - 86                                                          FL/HC:      22.9   %    19.4 - 21.8  HC:      301.3  mm     G. Age:  33w 3d          7  %    HC/AC:      0.97        0.96 - 1.11  AC:      310.4  mm     G. Age:  35w 0d         77  %    FL/BPD:     86.4   %    71 - 87  FL:       69.1  mm     G. Age:  35w 3d         75  %    FL/AC:      22.3   %    20 - 24  Est. FW:    2480  gm      5 lb 7 oz     59  % ---------------------------------------------------------------------- OB History  Gravidity:    6         Term:   1        Prem:   1        SAB:   2  TOP:          1       Ectopic:  0        Living: 3 ---------------------------------------------------------------------- Gestational Age  U/S Today:     34w 0d  EDD:   05/08/24  Best:          34w 1d     Det. By:  Previous Ultrasound      EDD:   05/07/24                                      (12/14/23) ---------------------------------------------------------------------- Anatomy  Cranium:               Appears normal         Aortic Arch:            Previously seen  Cavum:                 Previously seen        Ductal Arch:            Previously seen  Ventricles:            Appears normal         Diaphragm:              Appears normal  Choroid Plexus:        Previously seen        Stomach:                Appears normal, left                                                                         sided  Cerebellum:            Previously seen        Abdomen:                Previously seen  Posterior Fossa:       Previously seen        Abdominal Wall:         Previously seen  Face:                  Orbits and profile     Cord Vessels:           Previously seen                         previously seen  Lips:                  Previously seen        Kidneys:                Appear normal  Thoracic:              Previously seen        Bladder:                Appears normal  Heart:                 Appears normal         Spine:                  Previously seen                         (  4CH, axis, and                         situs)  RVOT:                  Previously seen        Upper Extremities:      Previously seen  LVOT:                  Previously seen        Lower Extremities:      Previously seen  Other:  Fetal anatomic survey complete on prior scans. ---------------------------------------------------------------------- Cervix Uterus Adnexa  Cervix  Not visualized (advanced GA >24wks)  Uterus  Single fibroid noted, see table below.  Right Ovary  Not visualized.  Left Ovary  Not visualized.  Cul De Sac  No free fluid seen.  Adnexa  No abnormality visualized ---------------------------------------------------------------------- Impression  G6 P1133 at 34w 1d gestation.  Systemic lupus erythematosus with acute flares.  Patient  takes azathioprine, prednisone  and Plaquenil .  She does not  have shortness of breath or chest pain.  Blood pressure today  at our office is 127/78 mmHg.  She does not have lupus nephritis.  Ultrasound  Amniotic fluid is normal and good fetal activity seen.  Fetal  growth is appropriate for gestational age.  Antenatal testing is  reassuring.  BPP 8/8.  I reassured the patient of the findings.  I discussed the safety  profile of azathioprine in pregnancy and that it is safe to  breast-feed while taking this medication.  Preterm delivery is more common in SLE.  We will  continue weekly antenatal testing.  We recommend delivery at [redacted] weeks gestation. ---------------------------------------------------------------------- Recommendations  - Continue weekly antenatal testing till delivery. ----------------------------------------------------------------------                      Cassandria Clever, MD Electronically Signed Corrected Final Report  03/27/2024 12:41 pm ----------------------------------------------------------------------   US  MFM OB FOLLOW UP Result Date: 03/27/2024 ----------------------------------------------------------------------  OBSTETRICS REPORT                    (Corrected Final 03/27/2024 12:41 pm) ---------------------------------------------------------------------- Patient Info  ID #:       [id number]                          D.O.B.:  07/21/1981 (43 yrs)(F)  Name:       Brittany HOMMEL [facility]-                Visit Date: 03/27/2024 09:25 am              [name] ---------------------------------------------------------------------- Performed By  Attending:        Cassandria Clever MD        Ref. Address:     [address]                                                             [place], [place]  [address]  Performed By:     Elsie Halo BS      Location[facility]                    RDMS RVT                                 [facility]  Referred By:      Malka Sea                    DO ---------------------------------------------------------------------- Orders  #  Description                           Code        Ordered By  1  US  MFM FETAL BPP WO NON               76819.01    RAVI SHANKAR     STRESS  2  US  MFM OB FOLLOW UP                   76816.01    RAVI [facility] ----------------------------------------------------------------------  #  Order #                      Accession #                Episode #  1  [id number]                   [id number]                 [id number]  2  [id number]                   [id number]                 [id number] ---------------------------------------------------------------------- Indications  Systemic lupus complicating pregnancy,         O26.893, M32.9  third trimester (prednisone , azathioprine,  plaquenil )  Advanced maternal age multigravida 35+,        O74.523  third trimester  Poor obstetric history: Previous preterm       O09.219  delivery, antepartum (36w twins)  LR NIPS - Female; no GDM  [redacted] weeks gestation of pregnancy                Z3A.34  Encounter for other antenatal screening        Z36.2  follow-up ---------------------------------------------------------------------- Fetal Evaluation  Num Of Fetuses:         1  Fetal Heart Rate(bpm):  144  Cardiac  Activity:       Observed  Presentation:           Cephalic  Placenta:               Left lateral  P. Cord Insertion:      Previously seen  Amniotic Fluid  AFI FV:      Within normal limits  AFI Sum(cm)     %Tile       Largest Pocket(cm)  13.94           48          3.96  RUQ(cm)       RLQ(cm)       LUQ(cm)        LLQ(cm)  2.96          3.52          3.96           3.5 ---------------------------------------------------------------------- Biophysical Evaluation  Amniotic F.V:   Within normal limits       F. Tone:        Observed  F. Movement:    Observed                   Score:          8/8  F. Breathing:   Observed ---------------------------------------------------------------------- Biometry  BPD:        80  mm     G. Age:  32w 1d          5  %    CI:        71.49   %    70 - 86                                                          FL/HC:      22.9   %    19.4 - 21.8  HC:      301.3  mm     G. Age:  33w 3d          7  %    HC/AC:      0.97        0.96 - 1.11  AC:      310.4  mm     G. Age:  35w 0d         77  %    FL/BPD:     86.4   %    71 - 87  FL:       69.1  mm     G. Age:   35w 3d         75  %    FL/AC:      22.3   %    20 - 24  Est. FW:    2480  gm      5 lb 7 oz     59  % ---------------------------------------------------------------------- OB History  Gravidity:    6         Term:   1        Prem:   1        SAB:   2  TOP:          1       Ectopic:  0  Living: 3 ---------------------------------------------------------------------- Gestational Age  U/S Today:     34w 0d                                        EDD:   05/08/24  Best:          34w 1d     Det. By:  Previous Ultrasound      EDD:   05/07/24                                      (12/14/23) ---------------------------------------------------------------------- Anatomy  Cranium:               Appears normal         Aortic Arch:            Previously seen  Cavum:                 Previously seen        Ductal Arch:            Previously seen  Ventricles:            Appears normal         Diaphragm:              Appears normal  Choroid Plexus:        Previously seen        Stomach:                Appears normal, left                                                                        sided  Cerebellum:            Previously seen        Abdomen:                Previously seen  Posterior Fossa:       Previously seen        Abdominal Wall:         Previously seen  Face:                  Orbits and profile     Cord Vessels:           Previously seen                         previously seen  Lips:                  Previously seen        Kidneys:                Appear normal  Thoracic:              Previously seen        Bladder:                Appears normal  Heart:  Appears normal         Spine:                  Previously seen                         (4CH, axis, and                         situs)  RVOT:                  Previously seen        Upper Extremities:      Previously seen  LVOT:                  Previously seen        Lower Extremities:      Previously seen  Other:  Fetal anatomic survey complete on  prior scans. ---------------------------------------------------------------------- Cervix Uterus Adnexa  Cervix  Not visualized (advanced GA >24wks)  Uterus  Single fibroid noted, see table below.  Right Ovary  Not visualized.  Left Ovary  Not visualized.  Cul De Sac  No free fluid seen.  Adnexa  No abnormality visualized ---------------------------------------------------------------------- Impression  G6 P1133 at 34w 1d gestation.  Systemic lupus erythematosus with acute flares.  Patient  takes azathioprine, prednisone  and Plaquenil .  She does not  have shortness of breath or chest pain.  Blood pressure today  at our office is 127/78 mmHg.  She does not have lupus nephritis.  Ultrasound  Amniotic fluid is normal and good fetal activity seen.  Fetal  growth is appropriate for gestational age.  Antenatal testing is  reassuring.  BPP 8/8.  I reassured the patient of the findings.  I discussed the safety  profile of azathioprine in pregnancy and that it is safe to  breast-feed while taking this medication.  Preterm delivery is more common in SLE.  We will continue weekly antenatal testing.  We recommend delivery at [redacted] weeks gestation. ---------------------------------------------------------------------- Recommendations  - Continue weekly antenatal testing till delivery. ----------------------------------------------------------------------                      Cassandria Clever, MD Electronically Signed Corrected Final Report  03/27/2024 12:41 pm ----------------------------------------------------------------------   US  MFM FETAL BPP WO NON STRESS Result Date: 03/20/2024 ----------------------------------------------------------------------  OBSTETRICS REPORT                       (Signed Final 03/20/2024 10:37 am) ---------------------------------------------------------------------- Patient Info  ID #:       [id number]                          D.O.B.:  1980/12/25 (43 yrs)(F)  Name:       Brittany [facility]                 Visit Date: 03/20/2024 08:08 am              [name] ---------------------------------------------------------------------- Performed By  Attending:        Penney Bowling DO       Ref. Address:     [address]  [place], [place]                                                             [address]  Performed By:     Levonne Rear        Location[facility]                                  [facility]  Referred By:      Malka Sea                    DO ---------------------------------------------------------------------- Orders  #  Description                           Code        Ordered By  1  US  MFM FETAL BPP WO NON               76819.01    RAVI [facility]     STRESS ----------------------------------------------------------------------  #  Order #                     Accession #                Episode #  1  [id number]                   [id number]                 [id number] ---------------------------------------------------------------------- Indications  Advanced maternal age multigravida 57+,        O60.523  third trimester  Systemic lupus complicating pregnancy,         O26.893, M32.9  third trimester (prednisone , azathioprine,  plaquenil )  Poor obstetric history: Previous preterm       O09.219  delivery, antepartum (36w twins)  LR NIPS - Female; no GDM  [redacted] weeks gestation of pregnancy                Z3A.33  Encounter for other antenatal screening        Z36.2  follow-up ---------------------------------------------------------------------- Fetal Evaluation  Num Of Fetuses:         1  Fetal Heart Rate(bpm):  153  Cardiac Activity:       Observed  Presentation:           Cephalic  Placenta:               Fundal  P. Cord Insertion:      Previously seen  Amniotic Fluid  AFI  FV:      Within normal limits  AFI Sum(cm)     %Tile       Largest Pocket(cm)  17.21           63          5.38  RUQ(cm)       RLQ(cm)       LUQ(cm)        LLQ(cm)  5.38          4.07          3.36           4.4 ---------------------------------------------------------------------- Biophysical Evaluation  Amniotic F.V:   Pocket => 2 cm             F. Tone:        Observed  F. Movement:    Observed                   Score:          8/8  F. Breathing:   Observed ---------------------------------------------------------------------- Biometry  LV:        3.7  mm ---------------------------------------------------------------------- OB History  Gravidity:    6         Term:   1        Prem:   1        SAB:   2  TOP:          1       Ectopic:  0        Living: 3 ---------------------------------------------------------------------- Gestational Age  Best:          33w 1d     Det. By:  Previous Ultrasound      EDD:   05/07/24                                      (12/14/23) ---------------------------------------------------------------------- Anatomy  Diaphragm:             Appears normal         Kidneys:                Appear normal  Stomach:               Appears normal, left   Bladder:                Appears normal                         sided ---------------------------------------------------------------------- Comments  MFM Consult Note  Carigan A [name] is doing well today with no  acute concerns.  RE SLE: Patient is doing well on Plaquenil .  She occasionally  has dizzy spells or lightheadedness.  She denies chest pain  or shortness of breath.  She is going to take a detailed log to  current try to eat and drink more consistently.  I encouraged  her to report this to her OB provider or go to the MAU with  any urgent concerns.  We discussed that delivery timing  would likely be around 37 weeks or sooner if indicated.  Sonographic findings  Single intrauterine pregnancy.  Fetal cardiac activity: Observed.   Presentation: Cephalic.  Interval fetal anatomy appears normal.  Amniotic fluid: Within normal limits.  MVP: 5.38 cm.  Placenta: Fundal.  BPP: 8/8.  There are limitations of prenatal ultrasound such as the  inability to detect certain abnormalities due to poor  visualization. Various factors such as fetal position,  gestational age and maternal body habitus may increase the  difficulty in visualizing the fetal anatomy.  Recommendations  -Serial growth ultrasounds every 4 weeks until delivery  -Continue weekly antenatal testing  -Delivery around [redacted] weeks gestation  -Precautions given regarding lightheadedness.  Consider a  neurology referral if this continues ----------------------------------------------------------------------                  Penney Bowling, DO Electronically Signed Final Report   03/20/2024 10:37 am ----------------------------------------------------------------------   US  MFM FETAL BPP WO NON STRESS Result Date: 03/13/2024 ----------------------------------------------------------------------  OBSTETRICS REPORT                       (Signed Final 03/13/2024 10:49 am) ---------------------------------------------------------------------- Patient Info  ID #:       [id number]                          D.O.B.:  1981/06/09 (43 yrs)(F)  Name:       Brittany [facility]                Visit Date: 03/13/2024 08:35 am              [name] ---------------------------------------------------------------------- Performed By  Attending:        Cassandria Clever MD        Ref. Address:     [address]                                                             [place], [place]                                                             [address]  Performed By:     Earnest Goad            Location[facility]                    RDMS                                     [facility]                                                             [facility]  Referred By:      Malka Sea                    DO ---------------------------------------------------------------------- Orders  #  Description                           Code        Ordered By  1  US  MFM FETAL BPP WO NON               76819.01    RAVI [facility]     STRESS ----------------------------------------------------------------------  #  Order #                     Accession #                Episode #  1  [id number]                   [id number]                 [id number] ---------------------------------------------------------------------- Indications  Advanced maternal age multigravida 39+,        O15.523  third trimester  Systemic lupus complicating pregnancy,         O26.893, M32.9  third trimester (prednisone , azathioprine,  plaquenil )  Antenatal follow-up for nonvisualized fetal    Z36.2  anatomy  Poor obstetric history: Previous preterm       O09.219  delivery, antepartum (36w twins)  [redacted] weeks gestation of pregnancy                Z3A.32  LR NIPS - Female; no GDM ---------------------------------------------------------------------- Fetal Evaluation  Num Of Fetuses:         1  Fetal Heart Rate(bpm):  157  Cardiac Activity:       Observed  Presentation:           Transverse, head to maternal left  Placenta:               Fundal  P. Cord Insertion:      Previously seen  Amniotic Fluid  AFI FV:      Within normal limits  AFI Sum(cm)     %Tile       Largest Pocket(cm)  9.11            9           3.49  RUQ(cm)       RLQ(cm)       LUQ(cm)        LLQ(cm)  3.49          1.47          1.09           3.06 ---------------------------------------------------------------------- Biophysical Evaluation  Amniotic F.V:   Within normal limits       F. Tone:        Observed  F. Movement:    Observed                   Score:          8/8  F. Breathing:   Observed ---------------------------------------------------------------------- Biometry  LV:        2.3  mm  ---------------------------------------------------------------------- OB History  Gravidity:    6         Term:   1        Prem:   1  SAB:   2  TOP:          1       Ectopic:  0        Living: 3 ---------------------------------------------------------------------- Gestational Age  Best:          32w 1d     Det. By:  Previous Ultrasound      EDD:   05/07/24                                      (12/14/23) ---------------------------------------------------------------------- Anatomy  Ventricles:            Appears normal         Stomach:                Appears normal, left                                                                        sided  Heart:                 Appears normal         Kidneys:                Appear normal                         (4CH, axis, and                         situs)  Diaphragm:             Appears normal         Bladder:                Appears normal ---------------------------------------------------------------------- Impression  Amniotic fluid is normal and good fetal activity seen.  Transverse lie and head to maternal left.  Antenatal testing is  reassuring.  BPP 8/8.  I reassured the patient of ultrasound findings.  xxxxxxxxxxxxxxxxxxxxxxxxxxxxxxxxxxxxxxxxxxxxxxxxxxxxxxxxx  xxxxxxxxxxx  G6 P1133 at 32w 3d gestation.  Patient is systemic lupus  erythematosus with acute flares.  She was admitted 10 days  ago and was discharged 2 days later.  Patient was  complaining of fatigue, weakness and dizziness.  Past medical history is also significant for sarcoidosis.  On 03/04/2024, patient was seen by her rheumatologist, Dr.  Lonnell Rob.  Complements C3 and C4 levels are  significantly reduced.  Urinalysis showed microalbuminuria.  Serum creatinine levels are within normal range.  Patient had echocardiography that showed normal ejection  fraction and mild pericardial effusion.  She takes prednisone  20 mg daily, Plaquenil  and, recently,  azathioprine 50 mg was added by her  rheumatologist.  Patient is also being followed by her nephrologist and  hematologist.  Patient still feels slightly unwell but has no shortness of  breath.  Blood pressure today at our office is 121/74 mmHg.  She had fetal echocardiography that was reported as normal.  Patient does acute flares, and I encouraged her to take  prednisone  regularly since azathioprine has a delayed onset  of action.  Azathioprine can be safely given in pregnancy.  I  encouraged her to come to the hospital if she has shortness  of breath or if her flare worsens.  Inpatient management may  be considered till delivery or until she becomes well.  We discussed timing of delivery.  It is reasonable to deliver at  [redacted] weeks gestation.  Patient agrees with my recommendation  to deliver at [redacted] weeks gestation.  Earlier delivery may be considered if she has significant  hypertension.  Patient does not seem to have lupus nephritis. ---------------------------------------------------------------------- Recommendations  -Continue weekly antenatal testing.  -Consider delivery at [redacted] weeks gestation. ----------------------------------------------------------------------                 Cassandria Clever, MD Electronically Signed Final Report   03/13/2024 10:49 am ----------------------------------------------------------------------    MAU Management/MDM: Orders Placed This Encounter  Procedures  . Wet prep, genital  . Culture, beta strep (group b only)  . CBC  . Comprehensive metabolic panel  . Protein / creatinine ratio, urine    Meds ordered this encounter  Medications  . diphenhydrAMINE  (BENADRYL ) capsule 25 mg  . metoCLOPramide (REGLAN) tablet 10 mg  . cyclobenzaprine  (FLEXERIL ) tablet 10 mg     Available prenatal records reviewed.  Patient presents with headache and high blood pressure at home in setting of high risk pregnancy complicated by lupus, sarcoid, AMA at [redacted]w[redacted]d gestation.  She is notably at higher risk for gestational  hypertension given comorbidities.  However with her description of headache today, this does appear to be migrainous in nature.  Will treat with Benadryl , Reglan, Flexeril .  Will additionally obtain preeclampsia labs.  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  Additionally obtain wet prep and GC/C given discharge.  ***  FWB: Reactive tracing and positive fetal movement.  ASSESSMENT No diagnosis found.  PLAN Discharge home with strict return precautions. Allergies as of 04/06/2024       Reactions   Latex Hives   Strawberry (diagnostic) Rash     Med Rec must be completed prior to using this [facility]***        Authur Leghorn, MD OB Fellow 04/06/2024  11:56 PM

## 2024-04-06 NOTE — MAU Provider Note (Signed)
 MAU Provider Note  Chief Complaint: Hypertension   Event Date/Time   First Provider Initiated Contact with Patient 04/06/24 2328      SUBJECTIVE HPI: Brittany [name] is a 43 y.o. [id number] at [redacted]w[redacted]d by midtrimester ultrasound who presents to maternity admissions reporting HA and high BP. Pregnancy c/b lupus, sarcoidosis. Receives [facility] with [employer].  Patient presents with headache. Mostly on R side. Throbbing in nature. Tylenol  minimally helpful. Is sensitive to light and noise. Additionally had an elevated BP at home. Has not had problems with HTN but has been monitoring at home given high risk for this complication. Denies vision changes, extremity swelling, RUQ pain, CP, SOB. +FM. Denies contractions, vaginal bleeding, urinary symptoms. Is having some mucuosy discharge but no leaking of fluid.   HPI  Past Medical History:  Diagnosis Date   Abnormal Pap smear of cervix 08/27/2020   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   Anemia    Anemia, unspecified 02/04/2018   Lupus    Sarcoidosis    Seasonal and perennial allergic rhinitis 02/28/2018   Urticaria    Past Surgical History:  Procedure Laterality Date   BIOPSY THYROID      BREAST BIOPSY Right 10/09/2022   MM RT BREAST BX W LOC DEV 1ST LESION IMAGE BX SPEC STEREO GUIDE 10/09/2022 [facility]   BREAST BIOPSY Right 10/25/2022   MM RT BREAST BX W LOC DEV 1ST LESION IMAGE BX SPEC STEREO GUIDE 10/25/2022 [facility]   DILATION AND CURETTAGE OF UTERUS     DILATION AND CURETTAGE, DIAGNOSTIC / THERAPEUTIC  01/04/2018   Social History   Socioeconomic History   Marital status: Married    Spouse name: Not on file   Number of children: Not on file   Years of education: Not on file   Highest education level: Not on file  Occupational History   Not on  file  Tobacco Use   Smoking status: Never   Smokeless tobacco: Never  Vaping Use   Vaping status: Never Used  Substance and Sexual Activity   Alcohol use: Not Currently    Alcohol/week: 1.0 standard drink of alcohol    Types: 1 Glasses of wine per week    Comment: social drinker   Drug use: Never   Sexual activity: Yes    Birth control/protection: None  Other Topics Concern   Not on file  Social History Narrative   Not on file   Social Drivers of Health   Financial Resource Strain: Not on file  Food Insecurity: No Food Insecurity (04/06/2024)   Hunger Vital Sign    Worried About Running Out of Food in the Last Year: Never true    Ran Out of Food in the Last Year: Never true  Transportation Needs: No Transportation Needs (02/28/2024)   PRAPARE - Administrator, Civil Service (Medical): No    Lack of Transportation (Non-Medical): No  Physical Activity: Not on file  Stress: Not on file  Social Connections: Not on file  Intimate Partner Violence: Not At Risk (02/28/2024)   Humiliation, Afraid, Rape, and Kick questionnaire    Fear of Current or Ex-Partner: No    Emotionally Abused: No    Physically Abused: No    Sexually Abused: No   No current facility-administered  medications on file prior to encounter.   Current Outpatient Medications on File Prior to Encounter  Medication Sig Dispense Refill   acetaminophen  (TYLENOL ) 325 MG tablet Take 2 tablets (650 mg total) by mouth every 4 (four) hours as needed (for pain scale < 4). 30 tablet 0   aspirin EC 81 MG tablet Take 81 mg by mouth daily. Swallow whole.     Cyanocobalamin  (B-12 COMPLIANCE INJECTION IJ) Inject as directed. (Patient not taking: Reported on 04/02/2024)     cyclobenzaprine  (FLEXERIL ) 5 MG tablet Take 1-2 tablets (5-10 mg total) by mouth 3 (three) times daily as needed for muscle spasms. 20 tablet 0   famotidine  (PEPCID ) 40 MG tablet Take 1 tablet (40 mg total) by mouth daily. 30 tablet 0    hydroxychloroquine  (PLAQUENIL ) 200 MG tablet Take by mouth.     hydrOXYzine  (ATARAX ) 25 MG tablet Take 1 tablet (25 mg total) by mouth 3 (three) times daily as needed for itching. 30 tablet 0   loratadine  (CLARITIN ) 10 MG tablet Take 1 tablet (10 mg total) by mouth daily. 30 tablet 0   ondansetron  (ZOFRAN -ODT) 4 MG disintegrating tablet Take 1 tablet (4 mg total) by mouth every 8 (eight) hours as needed for nausea or vomiting. 20 tablet 0   predniSONE  (DELTASONE ) 20 MG tablet Take 20 mg by mouth daily with breakfast.     Prenatal Vit-Fe Fumarate-FA (MULTIVITAMIN-PRENATAL) 27-0.8 MG TABS tablet Take 1 tablet by mouth daily at 12 noon.     Allergies  Allergen Reactions   Latex Hives   Strawberry (Diagnostic) Rash    ROS:  Pertinent positives/negatives listed above.  I have reviewed patient's Past Medical Hx, Surgical Hx, Family Hx, Social Hx, medications and allergies.   Physical Exam  Patient Vitals for the past 24 hrs:  BP Temp Temp src Pulse Resp Height Weight  04/07/24 0030 (!) 142/83 -- -- 95 -- -- --  04/07/24 0015 (!) 144/88 -- -- 96 -- -- --  04/07/24 0000 (!) 154/95 -- -- (!) 105 -- -- --  04/06/24 2345 132/82 -- -- (!) 102 -- -- --  04/06/24 2330 (!) 149/87 -- -- 96 -- -- --  04/06/24 2312 133/80 98.2 F (36.8 C) -- (!) 108 17 -- --  04/06/24 2100 124/77 (!) 97.5 F (36.4 C) Oral (!) 107 -- 5\' 8"  (1.727 m) 90 kg   Constitutional: Well-developed, well-nourished female. Appears uncomfortable Skin: Significant malar rash and rash on finger tips Cardiovascular: normal rate Respiratory: normal effort GI: Abd soft, non-tender MS: Extremities nontender, no edema, normal ROM Neurologic: Alert and oriented x 4.  No focal deficits.  Logical and linear thought process GU: Neg CVAT  FHT:  Baseline 150 , moderate variability, accelerations present, no decelerations Contractions: irregular/irritability  LAB RESULTS Results for orders placed or performed during the hospital  encounter of 04/06/24 (from the past 24 hours)  CBC     Status: Abnormal   Collection Time: 04/06/24 11:44 PM  Result Value Ref Range   WBC 3.2 (L) 4.0 - 10.5 K/uL   RBC 3.16 (L) 3.87 - 5.11 MIL/uL   Hemoglobin 10.6 (L) 12.0 - 15.0 g/dL   HCT 16.1 (L) 09.6 - 04.5 %   MCV 94.0 80.0 - 100.0 fL   MCH 33.5 26.0 - 34.0 pg   MCHC 35.7 30.0 - 36.0 g/dL   RDW 40.9 (H) 81.1 - 91.4 %   Platelets 206 150 - 400 K/uL   nRBC 0.0 0.0 - 0.2 %  Comprehensive metabolic panel     Status: Abnormal   Collection Time: 04/06/24 11:44 PM  Result Value Ref Range   Sodium 133 (L) 135 - 145 mmol/L   Potassium 4.1 3.5 - 5.1 mmol/L   Chloride 107 98 - 111 mmol/L   CO2 16 (L) 22 - 32 mmol/L   Glucose, Bld 83 70 - 99 mg/dL   BUN 5 (L) 6 - 20 mg/dL   Creatinine, Ser 4.09 0.44 - 1.00 mg/dL   Calcium  8.5 (L) 8.9 - 10.3 mg/dL   Total Protein 6.2 (L) 6.5 - 8.1 g/dL   Albumin 1.9 (L) 3.5 - 5.0 g/dL   AST 31 15 - 41 U/L   ALT 12 0 - 44 U/L   Alkaline Phosphatase 136 (H) 38 - 126 U/L   Total Bilirubin 0.6 0.0 - 1.2 mg/dL   GFR, Estimated >81 >19 mL/min   Anion gap 10 5 - 15  Protein / creatinine ratio, urine     Status: Abnormal   Collection Time: 04/06/24 11:44 PM  Result Value Ref Range   Creatinine, Urine 48 mg/dL   Total Protein, Urine 115 mg/dL   Protein Creatinine Ratio 2.40 (H) 0.00 - 0.15 mg/mg[Cre]  Wet prep, genital     Status: Abnormal   Collection Time: 04/06/24 11:46 PM  Result Value Ref Range   Yeast Wet Prep HPF POC NONE SEEN NONE SEEN   Trich, Wet Prep NONE SEEN NONE SEEN   Clue Cells Wet Prep HPF POC PRESENT (A) NONE SEEN   WBC, Wet Prep HPF POC >=10 (A) <10   Sperm NONE SEEN     --/--/A POS (03/27 1234)  IMAGING US  MFM FETAL BPP WO NON STRESS Result Date: 04/03/2024 ----------------------------------------------------------------------  OBSTETRICS REPORT                       (Signed Final 04/03/2024 05:01 pm) ----------------------------------------------------------------------  Patient Info  ID #:       [id number]                          D.O.B.:  1980/12/12 (43 yrs)(F)  Name:       Brittany PENINGTON [facility]-                Visit Date: 04/03/2024 02:38 pm              [name] ---------------------------------------------------------------------- Performed By  Attending:        Penney Bowling DO       Ref. Address:     [address]                                                             [place], [place]                                                             [address]  Performed By:     Randal Bury BS,       Location[facility]  RD[facility], RVT                                [facility]  Referred By:      Malka Sea                    DO ---------------------------------------------------------------------- Orders  #  Description                           Code        Ordered By  1  US  MFM FETAL BPP WO NON               76819.01    RAVI [facility]     STRESS ----------------------------------------------------------------------  #  Order #                     Accession #                Episode #  1  [id number]                   [id number]                 [id number] ---------------------------------------------------------------------- Indications  [redacted] weeks gestation of pregnancy                Z3A.35  Systemic lupus complicating pregnancy,         O26.893, M32.9  third trimester (prednisone , azathioprine,  plaquenil )  Advanced maternal age multigravida 76+,        O14.523  third trimester  Poor obstetric history: Previous preterm       O09.219  delivery, antepartum (36w twins)  LR NIPS - Female; no GDM  Encounter for other antenatal screening        Z36.2  follow-up ---------------------------------------------------------------------- Fetal Evaluation  Num Of Fetuses:         1  Fetal Heart Rate(bpm):  154  Cardiac Activity:        Observed  Presentation:           Cephalic  Placenta:               Fundal  P. Cord Insertion:      Previously seen  Amniotic Fluid  AFI FV:      Within normal limits  AFI Sum(cm)     %Tile       Largest Pocket(cm)  15.3            55          5.5  RUQ(cm)       RLQ(cm)       LUQ(cm)  LLQ(cm)  4             2.37          5.5            3.43 ---------------------------------------------------------------------- Biophysical Evaluation  Amniotic F.V:   Within normal limits       F. Tone:        Observed  F. Movement:    Observed                   Score:          8/8  F. Breathing:   Observed ---------------------------------------------------------------------- OB History  Gravidity:    6         Term:   1        Prem:   1        SAB:   2  TOP:          1       Ectopic:  0        Living: 3 ---------------------------------------------------------------------- Gestational Age  Best:          35w 1d     Det. By:  Previous Ultrasound      EDD:   05/07/24                                      (12/14/23) ---------------------------------------------------------------------- Anatomy  Ventricles:            Appears normal         Stomach:                Appears normal, left                                                                        sided  Heart:                 Appears normal         Kidneys:                Appear normal                         (4CH, axis, and                         situs)  Diaphragm:             Appears normal         Bladder:                Appears normal  Other:  Technically difficult due to advanced GA and fetal position. ---------------------------------------------------------------------- Cervix Uterus Adnexa  Cervix  Not visualized (advanced GA >24wks)  Uterus  Single fibroid noted, see table below.  Right Ovary  Not visualized.  Left Ovary  Within normal limits.  Cul De Sac  No free fluid seen.  Adnexa  No abnormality visualized  ---------------------------------------------------------------------- Comments  Shivon A [name] is doing well today with no  acute concerns.  The patient verbalized that she has some fatigue and  shortness of breath today.  She reports that this has been a  chronic issue.  She denies chest pain or palpitations.  I asked  her if she felt that her symptoms were significant enough from  her baseline to go to the MAU.  The patient verbalized that  she does not feel like it is significant enough to warrant  emergent attention.  I gave her precautions to go to the MAU  with any other concerns or if her symptoms worsen.  Sonographic findings  Single intrauterine pregnancy.  Fetal cardiac activity: Observed.  Presentation: Cephalic.  Interval fetal anatomy appears normal.  Amniotic fluid: Within normal limits.  MVP: 5.5 cm.  Placenta: Fundal.  BPP: 8/8.  Recommendations  1. Continue weekly antenatal testing until delivery  2. Growth ultrasounds every 4 weeks until delivery  3. Delivery around [redacted] weeks gestation or sooner if clinically  indicated ----------------------------------------------------------------------                 Penney Bowling, DO Electronically Signed Final Report   04/03/2024 05:01 pm ----------------------------------------------------------------------   US  MFM FETAL BPP WO NON STRESS Result Date: 03/27/2024 ----------------------------------------------------------------------  OBSTETRICS REPORT                    (Corrected Final 03/27/2024 12:41 pm) ---------------------------------------------------------------------- Patient Info  ID #:       [id number]                          D.O.B.:  1981/05/16 (43 yrs)(F)  Name:       Brittany KLOUDA [facility]-                Visit Date: 03/27/2024 09:25 am              [name] ---------------------------------------------------------------------- Performed By  Attending:        Cassandria Clever MD        Ref. Address:     [address]                                                              [place], [place]                                                             [address]  Performed By:     Elsie Halo BS      Location[facility]                    RDMS RVT                                 [facility]  Referred By:      Malka Sea                    DO ---------------------------------------------------------------------- Orders  #  Description                           Code        Ordered By  1  US  MFM FETAL BPP WO NON               76819.01    RAVI SHANKAR     STRESS  2  US  MFM OB FOLLOW UP                   76816.01    RAVI [facility] ----------------------------------------------------------------------  #  Order #                     Accession #                Episode #  1  [id number]                   [id number]                 [id number]  2  [id number]                   [id number]                 [id number] ---------------------------------------------------------------------- Indications  Systemic lupus complicating pregnancy,         O26.893, M32.9  third trimester (prednisone , azathioprine,  plaquenil )  Advanced maternal age multigravida 77+,        O36.523  third trimester  Poor obstetric history: Previous preterm       O09.219  delivery, antepartum (36w twins)  LR NIPS - Female; no GDM  [redacted] weeks gestation of pregnancy                Z3A.34  Encounter for other antenatal screening        Z36.2  follow-up ---------------------------------------------------------------------- Fetal Evaluation  Num Of Fetuses:         1  Fetal Heart Rate(bpm):  144  Cardiac Activity:       Observed  Presentation:           Cephalic  Placenta:               Left lateral  P. Cord Insertion:      Previously seen  Amniotic Fluid  AFI FV:      Within normal limits  AFI Sum(cm)     %Tile       Largest Pocket(cm)  13.94           48           3.96  RUQ(cm)       RLQ(cm)       LUQ(cm)        LLQ(cm)  2.96          3.52  3.96           3.5 ---------------------------------------------------------------------- Biophysical Evaluation  Amniotic F.V:   Within normal limits       F. Tone:        Observed  F. Movement:    Observed                   Score:          8/8  F. Breathing:   Observed ---------------------------------------------------------------------- Biometry  BPD:        80  mm     G. Age:  32w 1d          5  %    CI:        71.49   %    70 - 86                                                          FL/HC:      22.9   %    19.4 - 21.8  HC:      301.3  mm     G. Age:  33w 3d          7  %    HC/AC:      0.97        0.96 - 1.11  AC:      310.4  mm     G. Age:  35w 0d         77  %    FL/BPD:     86.4   %    71 - 87  FL:       69.1  mm     G. Age:  35w 3d         75  %    FL/AC:      22.3   %    20 - 24  Est. FW:    2480  gm      5 lb 7 oz     59  % ---------------------------------------------------------------------- OB History  Gravidity:    6         Term:   1        Prem:   1        SAB:   2  TOP:          1       Ectopic:  0        Living: 3 ---------------------------------------------------------------------- Gestational Age  U/S Today:     34w 0d                                        EDD:   05/08/24  Best:          34w 1d     Det. By:  Previous Ultrasound      EDD:   05/07/24                                      (12/14/23) ---------------------------------------------------------------------- Anatomy  Cranium:               Appears  normal         Aortic Arch:            Previously seen  Cavum:                 Previously seen        Ductal Arch:            Previously seen  Ventricles:            Appears normal         Diaphragm:              Appears normal  Choroid Plexus:        Previously seen        Stomach:                Appears normal, left                                                                        sided   Cerebellum:            Previously seen        Abdomen:                Previously seen  Posterior Fossa:       Previously seen        Abdominal Wall:         Previously seen  Face:                  Orbits and profile     Cord Vessels:           Previously seen                         previously seen  Lips:                  Previously seen        Kidneys:                Appear normal  Thoracic:              Previously seen        Bladder:                Appears normal  Heart:                 Appears normal         Spine:                  Previously seen                         (4CH, axis, and                         situs)  RVOT:                  Previously seen        Upper Extremities:      Previously seen  LVOT:                  Previously seen  Lower Extremities:      Previously seen  Other:  Fetal anatomic survey complete on prior scans. ---------------------------------------------------------------------- Cervix Uterus Adnexa  Cervix  Not visualized (advanced GA >24wks)  Uterus  Single fibroid noted, see table below.  Right Ovary  Not visualized.  Left Ovary  Not visualized.  Cul De Sac  No free fluid seen.  Adnexa  No abnormality visualized ---------------------------------------------------------------------- Impression  G6 P1133 at 34w 1d gestation.  Systemic lupus erythematosus with acute flares.  Patient  takes azathioprine, prednisone  and Plaquenil .  She does not  have shortness of breath or chest pain.  Blood pressure today  at our office is 127/78 mmHg.  She does not have lupus nephritis.  Ultrasound  Amniotic fluid is normal and good fetal activity seen.  Fetal  growth is appropriate for gestational age.  Antenatal testing is  reassuring.  BPP 8/8.  I reassured the patient of the findings.  I discussed the safety  profile of azathioprine in pregnancy and that it is safe to  breast-feed while taking this medication.  Preterm delivery is more common in SLE.  We will continue weekly antenatal  testing.  We recommend delivery at [redacted] weeks gestation. ---------------------------------------------------------------------- Recommendations  - Continue weekly antenatal testing till delivery. ----------------------------------------------------------------------                      Cassandria Clever, MD Electronically Signed Corrected Final Report  03/27/2024 12:41 pm ----------------------------------------------------------------------   US  MFM OB FOLLOW UP Result Date: 03/27/2024 ----------------------------------------------------------------------  OBSTETRICS REPORT                    (Corrected Final 03/27/2024 12:41 pm) ---------------------------------------------------------------------- Patient Info  ID #:       [id number]                          D.O.B.:  [birth year] (43 yrs)(F)  Name:       Brittany WEISSBERG [facility]-                Visit Date: 03/27/2024 09:25 am              [name] ---------------------------------------------------------------------- Performed By  Attending:        Cassandria Clever MD        Ref. Address:     [address]                                                             [place], [place]                                                             [address]  Performed By:     Elsie Halo BS      Location[facility]                    RDMS RVT  [facility]  Referred By:      Malka Sea                    DO ---------------------------------------------------------------------- Orders  #  Description                           Code        Ordered By  1  US  MFM FETAL BPP WO NON               76819.01    RAVI SHANKAR     STRESS  2  US  MFM OB FOLLOW UP                   76816.01    RAVI [facility] ----------------------------------------------------------------------  #  Order #                     Accession #                 Episode #  1  [id number]                   [id number]                 [id number]  2  [id number]                   [id number]                 [id number] ---------------------------------------------------------------------- Indications  Systemic lupus complicating pregnancy,         O26.893, M32.9  third trimester (prednisone , azathioprine,  plaquenil )  Advanced maternal age multigravida 24+,        O38.523  third trimester  Poor obstetric history: Previous preterm       O09.219  delivery, antepartum (36w twins)  LR NIPS - Female; no GDM  [redacted] weeks gestation of pregnancy                Z3A.34  Encounter for other antenatal screening        Z36.2  follow-up ---------------------------------------------------------------------- Fetal Evaluation  Num Of Fetuses:         1  Fetal Heart Rate(bpm):  144  Cardiac Activity:       Observed  Presentation:           Cephalic  Placenta:               Left lateral  P. Cord Insertion:      Previously seen  Amniotic Fluid  AFI FV:      Within normal limits  AFI Sum(cm)     %Tile       Largest Pocket(cm)  13.94           48          3.96  RUQ(cm)       RLQ(cm)       LUQ(cm)        LLQ(cm)  2.96          3.52          3.96           3.5 ---------------------------------------------------------------------- Biophysical Evaluation  Amniotic F.V:   Within normal limits       F. Tone:        Observed  F. Movement:    Observed                   Score:          8/8  F. Breathing:   Observed ---------------------------------------------------------------------- Biometry  BPD:        80  mm     G. Age:  32w 1d          5  %    CI:        71.49   %    70 - 86                                                          FL/HC:      22.9   %    19.4 - 21.8  HC:      301.3  mm     G. Age:  33w 3d          7  %    HC/AC:      0.97        0.96 - 1.11  AC:      310.4  mm     G. Age:  35w 0d         77  %    FL/BPD:     86.4   %    71 - 87  FL:       69.1  mm     G. Age:  35w 3d         75  %     FL/AC:      22.3   %    20 - 24  Est. FW:    2480  gm      5 lb 7 oz     59  % ---------------------------------------------------------------------- OB History  Gravidity:    6         Term:   1        Prem:   1        SAB:   2  TOP:          1       Ectopic:  0        Living: 3 ---------------------------------------------------------------------- Gestational Age  U/S Today:     34w 0d                                        EDD:   05/08/24  Best:          34w 1d     Det. By:  Previous Ultrasound      EDD:   05/07/24                                      (12/14/23) ---------------------------------------------------------------------- Anatomy  Cranium:               Appears normal         Aortic Arch:            Previously seen  Cavum:                 Previously seen        Ductal Arch:            Previously seen  Ventricles:            Appears normal         Diaphragm:              Appears normal  Choroid Plexus:        Previously seen        Stomach:                Appears normal, left                                                                        sided  Cerebellum:            Previously seen        Abdomen:                Previously seen  Posterior Fossa:       Previously seen        Abdominal Wall:         Previously seen  Face:                  Orbits and profile     Cord Vessels:           Previously seen                         previously seen  Lips:                  Previously seen        Kidneys:                Appear normal  Thoracic:              Previously seen        Bladder:                Appears normal  Heart:                 Appears normal         Spine:                  Previously seen                         (4CH, axis, and  situs)  RVOT:                  Previously seen        Upper Extremities:      Previously seen  LVOT:                  Previously seen        Lower Extremities:      Previously seen  Other:  Fetal anatomic survey complete on prior scans.  ---------------------------------------------------------------------- Cervix Uterus Adnexa  Cervix  Not visualized (advanced GA >24wks)  Uterus  Single fibroid noted, see table below.  Right Ovary  Not visualized.  Left Ovary  Not visualized.  Cul De Sac  No free fluid seen.  Adnexa  No abnormality visualized ---------------------------------------------------------------------- Impression  G6 P1133 at 34w 1d gestation.  Systemic lupus erythematosus with acute flares.  Patient  takes azathioprine, prednisone  and Plaquenil .  She does not  have shortness of breath or chest pain.  Blood pressure today  at our office is 127/78 mmHg.  She does not have lupus nephritis.  Ultrasound  Amniotic fluid is normal and good fetal activity seen.  Fetal  growth is appropriate for gestational age.  Antenatal testing is  reassuring.  BPP 8/8.  I reassured the patient of the findings.  I discussed the safety  profile of azathioprine in pregnancy and that it is safe to  breast-feed while taking this medication.  Preterm delivery is more common in SLE.  We will continue weekly antenatal testing.  We recommend delivery at [redacted] weeks gestation. ---------------------------------------------------------------------- Recommendations  - Continue weekly antenatal testing till delivery. ----------------------------------------------------------------------                      Cassandria Clever, MD Electronically Signed Corrected Final Report  03/27/2024 12:41 pm ----------------------------------------------------------------------   US  MFM FETAL BPP WO NON STRESS Result Date: 03/20/2024 ----------------------------------------------------------------------  OBSTETRICS REPORT                       (Signed Final 03/20/2024 10:37 am) ---------------------------------------------------------------------- Patient Info  ID #:       [id number]                          D.O.B.:  03-23-1981 (43 yrs)(F)  Name:       Brittany ASAY [facility]-                Visit Date:  03/20/2024 08:08 am              [name] ---------------------------------------------------------------------- Performed By  Attending:        Penney Bowling DO       Ref. Address:     [address]                                                             [place], [place]  [address]  Performed By:     Levonne Rear        Location[facility]                                  [facility]  Referred By:      Malka Sea                    DO ---------------------------------------------------------------------- Orders  #  Description                           Code        Ordered By  1  US  MFM FETAL BPP WO NON               76819.01    RAVI [facility]     STRESS ----------------------------------------------------------------------  #  Order #                     Accession #                Episode #  1  [id number]                   [id number]                 [id number] ---------------------------------------------------------------------- Indications  Advanced maternal age multigravida 47+,        O53.523  third trimester  Systemic lupus complicating pregnancy,         O26.893, M32.9  third trimester (prednisone , azathioprine,  plaquenil )  Poor obstetric history: Previous preterm       O09.219  delivery, antepartum (36w twins)  LR NIPS - Female; no GDM  [redacted] weeks gestation of pregnancy                Z3A.33  Encounter for other antenatal screening        Z36.2  follow-up ---------------------------------------------------------------------- Fetal Evaluation  Num Of Fetuses:         1  Fetal Heart Rate(bpm):  153  Cardiac Activity:       Observed  Presentation:           Cephalic  Placenta:               Fundal  P. Cord Insertion:      Previously seen  Amniotic Fluid  AFI FV:       Within normal limits  AFI Sum(cm)     %  Tile       Largest Pocket(cm)  17.21           63          5.38  RUQ(cm)       RLQ(cm)       LUQ(cm)        LLQ(cm)  5.38          4.07          3.36           4.4 ---------------------------------------------------------------------- Biophysical Evaluation  Amniotic F.V:   Pocket => 2 cm             F. Tone:        Observed  F. Movement:    Observed                   Score:          8/8  F. Breathing:   Observed ---------------------------------------------------------------------- Biometry  LV:        3.7  mm ---------------------------------------------------------------------- OB History  Gravidity:    6         Term:   1        Prem:   1        SAB:   2  TOP:          1       Ectopic:  0        Living: 3 ---------------------------------------------------------------------- Gestational Age  Best:          33w 1d     Det. By:  Previous Ultrasound      EDD:   05/07/24                                      (12/14/23) ---------------------------------------------------------------------- Anatomy  Diaphragm:             Appears normal         Kidneys:                Appear normal  Stomach:               Appears normal, left   Bladder:                Appears normal                         sided ---------------------------------------------------------------------- Comments  MFM Consult Note  Brittany A [name] is doing well today with no  acute concerns.  RE SLE: Patient is doing well on Plaquenil .  She occasionally  has dizzy spells or lightheadedness.  She denies chest pain  or shortness of breath.  She is going to take a detailed log to  current try to eat and drink more consistently.  I encouraged  her to report this to her OB provider or go to the MAU with  any urgent concerns.  We discussed that delivery timing  would likely be around 37 weeks or sooner if indicated.  Sonographic findings  Single intrauterine pregnancy.  Fetal cardiac activity: Observed.  Presentation:  Cephalic.  Interval fetal anatomy appears normal.  Amniotic fluid: Within normal limits.  MVP: 5.38 cm.  Placenta: Fundal.  BPP: 8/8.  There are limitations of prenatal ultrasound such as the  inability to detect certain abnormalities due to poor  visualization. Various factors such as  fetal position,  gestational age and maternal body habitus may increase the  difficulty in visualizing the fetal anatomy.  Recommendations  -Serial growth ultrasounds every 4 weeks until delivery  -Continue weekly antenatal testing  -Delivery around [redacted] weeks gestation  -Precautions given regarding lightheadedness.  Consider a  neurology referral if this continues ----------------------------------------------------------------------                  Penney Bowling, DO Electronically Signed Final Report   03/20/2024 10:37 am ----------------------------------------------------------------------   US  MFM FETAL BPP WO NON STRESS Result Date: 03/13/2024 ----------------------------------------------------------------------  OBSTETRICS REPORT                       (Signed Final 03/13/2024 10:49 am) ---------------------------------------------------------------------- Patient Info  ID #:       [id number]                          D.O.B.:  1981-01-06 (43 yrs)(F)  Name:       Brittany LEIPHART [facility]-                Visit Date: 03/13/2024 08:35 am              [name] ---------------------------------------------------------------------- Performed By  Attending:        Cassandria Clever MD        Ref. Address:     [address]                                                             [place], [place]                                                             [address]  Performed By:     Earnest Goad            Location[facility]                    RDMS                                     [facility]   Referred By:      JACOB J STINSON  DO ---------------------------------------------------------------------- Orders  #  Description                           Code        Ordered By  1  US  MFM FETAL BPP WO NON               76819.01    RAVI [facility]     STRESS ----------------------------------------------------------------------  #  Order #                     Accession #                Episode #  1  [id number]                   [id number]                 [id number] ---------------------------------------------------------------------- Indications  Advanced maternal age multigravida 84+,        O12.523  third trimester  Systemic lupus complicating pregnancy,         O26.893, M32.9  third trimester (prednisone , azathioprine,  plaquenil )  Antenatal follow-up for nonvisualized fetal    Z36.2  anatomy  Poor obstetric history: Previous preterm       O09.219  delivery, antepartum (36w twins)  [redacted] weeks gestation of pregnancy                Z3A.32  LR NIPS - Female; no GDM ---------------------------------------------------------------------- Fetal Evaluation  Num Of Fetuses:         1  Fetal Heart Rate(bpm):  157  Cardiac Activity:       Observed  Presentation:           Transverse, head to maternal left  Placenta:               Fundal  P. Cord Insertion:      Previously seen  Amniotic Fluid  AFI FV:      Within normal limits  AFI Sum(cm)     %Tile       Largest Pocket(cm)  9.11            9           3.49  RUQ(cm)       RLQ(cm)       LUQ(cm)        LLQ(cm)  3.49          1.47          1.09           3.06 ---------------------------------------------------------------------- Biophysical Evaluation  Amniotic F.V:   Within normal limits       F. Tone:        Observed  F. Movement:    Observed                   Score:          8/8  F. Breathing:   Observed ---------------------------------------------------------------------- Biometry  LV:        2.3  mm  ---------------------------------------------------------------------- OB History  Gravidity:    6         Term:   1        Prem:   1        SAB:   2  TOP:          1       Ectopic:  0  Living: 3 ---------------------------------------------------------------------- Gestational Age  Best:          32w 1d     Det. By:  Previous Ultrasound      EDD:   05/07/24                                      (12/14/23) ---------------------------------------------------------------------- Anatomy  Ventricles:            Appears normal         Stomach:                Appears normal, left                                                                        sided  Heart:                 Appears normal         Kidneys:                Appear normal                         (4CH, axis, and                         situs)  Diaphragm:             Appears normal         Bladder:                Appears normal ---------------------------------------------------------------------- Impression  Amniotic fluid is normal and good fetal activity seen.  Transverse lie and head to maternal left.  Antenatal testing is  reassuring.  BPP 8/8.  I reassured the patient of ultrasound findings.  xxxxxxxxxxxxxxxxxxxxxxxxxxxxxxxxxxxxxxxxxxxxxxxxxxxxxxxxx  xxxxxxxxxxx  G6 P1133 at 32w 3d gestation.  Patient is systemic lupus  erythematosus with acute flares.  She was admitted 10 days  ago and was discharged 2 days later.  Patient was  complaining of fatigue, weakness and dizziness.  Past medical history is also significant for sarcoidosis.  On 03/04/2024, patient was seen by her rheumatologist, Dr.  Lonnell Rob.  Complements C3 and C4 levels are  significantly reduced.  Urinalysis showed microalbuminuria.  Serum creatinine levels are within normal range.  Patient had echocardiography that showed normal ejection  fraction and mild pericardial effusion.  She takes prednisone  20 mg daily, Plaquenil  and, recently,  azathioprine 50 mg was added by her  rheumatologist.  Patient is also being followed by her nephrologist and  hematologist.  Patient still feels slightly unwell but has no shortness of  breath.  Blood pressure today at our office is 121/74 mmHg.  She had fetal echocardiography that was reported as normal.  Patient does acute flares, and I encouraged her to take  prednisone  regularly since azathioprine has a delayed onset  of action.  Azathioprine can be safely given in pregnancy.  I encouraged her to come to the hospital if she has shortness  of breath or if her flare worsens.  Inpatient management may  be considered till delivery or until she becomes  well.  We discussed timing of delivery.  It is reasonable to deliver at  [redacted] weeks gestation.  Patient agrees with my recommendation  to deliver at [redacted] weeks gestation.  Earlier delivery may be considered if she has significant  hypertension.  Patient does not seem to have lupus nephritis. ---------------------------------------------------------------------- Recommendations  -Continue weekly antenatal testing.  -Consider delivery at [redacted] weeks gestation. ----------------------------------------------------------------------                 Cassandria Clever, MD Electronically Signed Final Report   03/13/2024 10:49 am ----------------------------------------------------------------------    MAU Management/MDM: Orders Placed This Encounter  Procedures   Wet prep, genital   Culture, beta strep (group b only)   CBC   Comprehensive metabolic panel   Protein / creatinine ratio, urine   CBC   Comprehensive metabolic panel   Magnesium   Cervical Exam   Insert urethral catheter X 1 PRN If Coude Catheter is chosen, qualified resources by campus can be found in the clinical skills nursing procedure for Coude Catheter 1. If straight catheterized > 2 times or patient unable to void post epidural plac...   Notify physician (specify) Confirmatory reading of BP> 160/110 15 minutes later   Apply Hypertensive  Disorders of Pregnancy Care Plan   Measure blood pressure   Strict intake and output    Meds ordered this encounter  Medications   diphenhydrAMINE  (BENADRYL ) capsule 25 mg   metoCLOPramide (REGLAN) tablet 10 mg   cyclobenzaprine  (FLEXERIL ) tablet 10 mg   AND Linked Order Group    labetalol (NORMODYNE) injection 20 mg    labetalol (NORMODYNE) injection 40 mg    labetalol (NORMODYNE) injection 80 mg    hydrALAZINE (APRESOLINE) injection 10 mg   magnesium bolus via infusion 4 g   magnesium sulfate 40 grams in SWI 1000 mL OB infusion   lactated ringers  infusion     Available prenatal records reviewed.  Patient presents with headache and high blood pressure at home in setting of high risk pregnancy complicated by lupus, sarcoid, AMA at [redacted]w[redacted]d gestation.  She is notably at higher risk for gestational hypertension given comorbidities.  However with her description of headache today, this does appear to be migrainous in nature.  Will treat with Benadryl , Reglan, Flexeril .  Will additionally obtain preeclampsia labs.  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  Additionally obtain wet prep and GC/C given discharge.  Patient's headache only slightly improved with interventions and is still 6/10.  Additionally her labs were remarkable for an increase in her PC ratio to 2.4.  At this time meets criteria for preeclampsia with severe features given elevated blood pressures, increase in her baseline PC ratio, and severe feature of intractable headache.  Discussed these findings with patient and recommendation for induction of labor.  She is amenable to this.  FWB: Reactive tracing and positive fetal movement.  ASSESSMENT 1. Preeclampsia, severe, third trimester   2. Lupus anticoagulant complicating pregnancy, antepartum (HCC)   3. Other systemic lupus erythematosus with other organ involvement (HCC)   4. Sarcoidosis   5. Supervision of other high risk pregnancy, antepartum    6. Abnormal maternal serum screening test-AFP Tetra Pos  DSR 1:27   7. Multigravida of advanced maternal age in third trimester   8. Supervision of high risk pregnancy in third trimester     PLAN Admit to L&D for IOL for preE w/SF.  Authur Leghorn, MD OB Fellow 04/07/2024  12:51 AM

## 2024-04-07 ENCOUNTER — Encounter (HOSPITAL_COMMUNITY): Payer: Self-pay | Admitting: Obstetrics & Gynecology

## 2024-04-07 ENCOUNTER — Inpatient Hospital Stay (HOSPITAL_COMMUNITY): Admitting: Anesthesiology

## 2024-04-07 ENCOUNTER — Encounter: Admitting: Obstetrics and Gynecology

## 2024-04-07 ENCOUNTER — Other Ambulatory Visit: Payer: Self-pay

## 2024-04-07 ENCOUNTER — Encounter (HOSPITAL_COMMUNITY)
Admission: AD | Disposition: A | Discharge status: Home / Self Care | Payer: Self-pay | Source: Home / Self Care | Attending: Family Medicine

## 2024-04-07 DIAGNOSIS — O09523 Supervision of elderly multigravida, third trimester: Secondary | ICD-10-CM | POA: Diagnosis not present

## 2024-04-07 DIAGNOSIS — R030 Elevated blood-pressure reading, without diagnosis of hypertension: Secondary | ICD-10-CM | POA: Diagnosis not present

## 2024-04-07 DIAGNOSIS — Z6830 Body mass index (BMI) 30.0-30.9, adult: Secondary | ICD-10-CM | POA: Diagnosis not present

## 2024-04-07 DIAGNOSIS — D869 Sarcoidosis, unspecified: Secondary | ICD-10-CM | POA: Diagnosis not present

## 2024-04-07 DIAGNOSIS — O9902 Anemia complicating childbirth: Secondary | ICD-10-CM | POA: Diagnosis not present

## 2024-04-07 DIAGNOSIS — O99214 Obesity complicating childbirth: Secondary | ICD-10-CM | POA: Diagnosis not present

## 2024-04-07 DIAGNOSIS — M3214 Glomerular disease in systemic lupus erythematosus: Secondary | ICD-10-CM | POA: Diagnosis not present

## 2024-04-07 DIAGNOSIS — Z302 Encounter for sterilization: Secondary | ICD-10-CM

## 2024-04-07 DIAGNOSIS — I10 Essential (primary) hypertension: Secondary | ICD-10-CM | POA: Diagnosis not present

## 2024-04-07 DIAGNOSIS — E669 Obesity, unspecified: Secondary | ICD-10-CM

## 2024-04-07 DIAGNOSIS — E876 Hypokalemia: Secondary | ICD-10-CM | POA: Diagnosis not present

## 2024-04-07 DIAGNOSIS — O9912 Other diseases of the blood and blood-forming organs and certain disorders involving the immune mechanism complicating childbirth: Secondary | ICD-10-CM | POA: Diagnosis not present

## 2024-04-07 DIAGNOSIS — O1414 Severe pre-eclampsia complicating childbirth: Secondary | ICD-10-CM | POA: Diagnosis not present

## 2024-04-07 DIAGNOSIS — O1413 Severe pre-eclampsia, third trimester: Principal | ICD-10-CM | POA: Diagnosis present

## 2024-04-07 DIAGNOSIS — Z3A35 35 weeks gestation of pregnancy: Secondary | ICD-10-CM | POA: Diagnosis not present

## 2024-04-07 DIAGNOSIS — D6862 Lupus anticoagulant syndrome: Secondary | ICD-10-CM | POA: Diagnosis not present

## 2024-04-07 DIAGNOSIS — Z7982 Long term (current) use of aspirin: Secondary | ICD-10-CM | POA: Diagnosis not present

## 2024-04-07 DIAGNOSIS — D649 Anemia, unspecified: Secondary | ICD-10-CM | POA: Diagnosis not present

## 2024-04-07 DIAGNOSIS — Z8249 Family history of ischemic heart disease and other diseases of the circulatory system: Secondary | ICD-10-CM | POA: Diagnosis not present

## 2024-04-07 DIAGNOSIS — Z91040 Latex allergy status: Secondary | ICD-10-CM | POA: Diagnosis not present

## 2024-04-07 HISTORY — PX: TUBAL LIGATION: SHX77

## 2024-04-07 LAB — COMPREHENSIVE METABOLIC PANEL WITH GFR
ALT: 12 U/L (ref 0–44)
ALT: 13 U/L (ref 0–44)
ALT: 6 U/L (ref 0–44)
AST: 29 U/L (ref 15–41)
AST: 30 U/L (ref 15–41)
AST: 31 U/L (ref 15–41)
Albumin: 1.7 g/dL — ABNORMAL LOW (ref 3.5–5.0)
Albumin: 1.8 g/dL — ABNORMAL LOW (ref 3.5–5.0)
Albumin: 1.9 g/dL — ABNORMAL LOW (ref 3.5–5.0)
Alkaline Phosphatase: 107 U/L (ref 38–126)
Alkaline Phosphatase: 136 U/L — ABNORMAL HIGH (ref 38–126)
Alkaline Phosphatase: 139 U/L — ABNORMAL HIGH (ref 38–126)
Anion gap: 10 (ref 5–15)
Anion gap: 12 (ref 5–15)
Anion gap: 9 (ref 5–15)
BUN: 5 mg/dL — ABNORMAL LOW (ref 6–20)
BUN: 5 mg/dL — ABNORMAL LOW (ref 6–20)
BUN: <5 mg/dL — ABNORMAL LOW (ref 6–20)
CO2: 14 mmol/L — ABNORMAL LOW (ref 22–32)
CO2: 16 mmol/L — ABNORMAL LOW (ref 22–32)
CO2: 17 mmol/L — ABNORMAL LOW (ref 22–32)
Calcium: 7.9 mg/dL — ABNORMAL LOW (ref 8.9–10.3)
Calcium: 8.2 mg/dL — ABNORMAL LOW (ref 8.9–10.3)
Calcium: 8.5 mg/dL — ABNORMAL LOW (ref 8.9–10.3)
Chloride: 107 mmol/L (ref 98–111)
Chloride: 107 mmol/L (ref 98–111)
Chloride: 107 mmol/L (ref 98–111)
Creatinine, Ser: 0.62 mg/dL (ref 0.44–1.00)
Creatinine, Ser: 0.65 mg/dL (ref 0.44–1.00)
Creatinine, Ser: 0.67 mg/dL (ref 0.44–1.00)
GFR, Estimated: >60 mL/min (ref >60)
GFR, Estimated: >60 mL/min (ref >60)
GFR, Estimated: >60 mL/min (ref >60)
Glucose, Bld: 138 mg/dL — ABNORMAL HIGH (ref 70–99)
Glucose, Bld: 83 mg/dL (ref 70–99)
Glucose, Bld: 87 mg/dL (ref 70–99)
Potassium: 3.6 mmol/L (ref 3.5–5.1)
Potassium: 3.8 mmol/L (ref 3.5–5.1)
Potassium: 4.1 mmol/L (ref 3.5–5.1)
Sodium: 133 mmol/L — ABNORMAL LOW (ref 135–145)
Sodium: 133 mmol/L — ABNORMAL LOW (ref 135–145)
Sodium: 133 mmol/L — ABNORMAL LOW (ref 135–145)
Total Bilirubin: 0.6 mg/dL (ref 0.0–1.2)
Total Bilirubin: 0.6 mg/dL (ref 0.0–1.2)
Total Bilirubin: 0.9 mg/dL (ref 0.0–1.2)
Total Protein: 5.1 g/dL — ABNORMAL LOW (ref 6.5–8.1)
Total Protein: 6.2 g/dL — ABNORMAL LOW (ref 6.5–8.1)
Total Protein: 6.3 g/dL — ABNORMAL LOW (ref 6.5–8.1)

## 2024-04-07 LAB — CBC
HCT: 27.4 % — ABNORMAL LOW (ref 36.0–46.0)
HCT: 29.6 % — ABNORMAL LOW (ref 36.0–46.0)
HCT: 29.7 % — ABNORMAL LOW (ref 36.0–46.0)
HCT: 30.4 % — ABNORMAL LOW (ref 36.0–46.0)
Hemoglobin: 10.4 g/dL — ABNORMAL LOW (ref 12.0–15.0)
Hemoglobin: 10.6 g/dL — ABNORMAL LOW (ref 12.0–15.0)
Hemoglobin: 9.6 g/dL — ABNORMAL LOW (ref 12.0–15.0)
Hemoglobin: 9.9 g/dL — ABNORMAL LOW (ref 12.0–15.0)
MCH: 30.8 pg (ref 26.0–34.0)
MCH: 31.5 pg (ref 26.0–34.0)
MCH: 33.5 pg (ref 26.0–34.0)
MCH: 33.9 pg (ref 26.0–34.0)
MCHC: 33.4 g/dL (ref 30.0–36.0)
MCHC: 34.2 g/dL (ref 30.0–36.0)
MCHC: 35 g/dL (ref 30.0–36.0)
MCHC: 35.7 g/dL (ref 30.0–36.0)
MCV: 92.1 fL (ref 80.0–100.0)
MCV: 92.2 fL (ref 80.0–100.0)
MCV: 94 fL (ref 80.0–100.0)
MCV: 96.8 fL (ref 80.0–100.0)
Platelets: 151 10*3/uL (ref 150–400)
Platelets: 173 10*3/uL (ref 150–400)
Platelets: 206 10*3/uL (ref 150–400)
Platelets: 209 10*3/uL (ref 150–400)
RBC: 2.83 MIL/uL — ABNORMAL LOW (ref 3.87–5.11)
RBC: 3.16 MIL/uL — ABNORMAL LOW (ref 3.87–5.11)
RBC: 3.21 MIL/uL — ABNORMAL LOW (ref 3.87–5.11)
RBC: 3.3 MIL/uL — ABNORMAL LOW (ref 3.87–5.11)
RDW: 15.9 % — ABNORMAL HIGH (ref 11.5–15.5)
RDW: 15.9 % — ABNORMAL HIGH (ref 11.5–15.5)
RDW: 15.9 % — ABNORMAL HIGH (ref 11.5–15.5)
RDW: 16.2 % — ABNORMAL HIGH (ref 11.5–15.5)
WBC: 3.2 10*3/uL — ABNORMAL LOW (ref 4.0–10.5)
WBC: 3.8 10*3/uL — ABNORMAL LOW (ref 4.0–10.5)
WBC: 4.1 10*3/uL (ref 4.0–10.5)
WBC: 4.2 10*3/uL (ref 4.0–10.5)
nRBC: 0 % (ref 0.0–0.2)
nRBC: 0 % (ref 0.0–0.2)
nRBC: 0 % (ref 0.0–0.2)
nRBC: 0 % (ref 0.0–0.2)

## 2024-04-07 LAB — MAGNESIUM
Magnesium: 4.4 mg/dL — ABNORMAL HIGH (ref 1.7–2.4)
Magnesium: 6.4 mg/dL (ref 1.7–2.4)
Magnesium: 6.5 mg/dL (ref 1.7–2.4)

## 2024-04-07 LAB — RPR: RPR Ser Ql: NONREACTIVE

## 2024-04-07 LAB — WET PREP, GENITAL
Sperm: NONE SEEN
Trich, Wet Prep: NONE SEEN
WBC, Wet Prep HPF POC: >=10 — AB (ref <10)
Yeast Wet Prep HPF POC: NONE SEEN

## 2024-04-07 LAB — PROTEIN / CREATININE RATIO, URINE
Creatinine, Urine: 48 mg/dL
Protein Creatinine Ratio: 2.4 mg/mg{creat} — ABNORMAL HIGH (ref 0.00–0.15)
Total Protein, Urine: 115 mg/dL

## 2024-04-07 LAB — TYPE AND SCREEN
ABO/RH(D): A POS
Antibody Screen: NEGATIVE

## 2024-04-07 SURGERY — LIGATION, FALLOPIAN TUBE, POSTPARTUM
Anesthesia: Epidural | Site: Abdomen | Laterality: Bilateral

## 2024-04-07 MED ORDER — OXYCODONE-ACETAMINOPHEN 5-325 MG PO TABS: 1.0000 | ORAL_TABLET | ORAL | Status: DC | PRN

## 2024-04-07 MED ORDER — KETOROLAC TROMETHAMINE 30 MG/ML IJ SOLN: 30.0000 mg | Freq: Four times a day (QID) | INTRAMUSCULAR | Status: AC | PRN

## 2024-04-07 MED ORDER — TRIAMCINOLONE ACETONIDE 0.1 % EX OINT
TOPICAL_OINTMENT | Freq: Two times a day (BID) | CUTANEOUS | Status: DC
  Administered 2024-04-07: 1 via TOPICAL
  Filled 2024-04-07: qty 15

## 2024-04-07 MED ORDER — EPHEDRINE 5 MG/ML INJ
INTRAVENOUS | Status: AC
  Filled 2024-04-07: qty 5

## 2024-04-07 MED ORDER — MEPERIDINE HCL 25 MG/ML IJ SOLN: 6.2500 mg | INTRAMUSCULAR | Status: DC | PRN

## 2024-04-07 MED ORDER — PRENATAL MULTIVITAMIN CH
1.0000 | ORAL_TABLET | Freq: Every day | ORAL | Status: DC
  Filled 2024-04-07: qty 1

## 2024-04-07 MED ORDER — MAGNESIUM SULFATE BOLUS VIA INFUSION
4.0000 g | Freq: Once | INTRAVENOUS | Status: AC
  Administered 2024-04-07: 4 g via INTRAVENOUS
  Filled 2024-04-07: qty 1000

## 2024-04-07 MED ORDER — TERBUTALINE SULFATE 1 MG/ML IJ SOLN: 0.2500 mg | Freq: Once | INTRAMUSCULAR | Status: DC | PRN

## 2024-04-07 MED ORDER — METHYLPREDNISOLONE SODIUM SUCC 125 MG IJ SOLR
125.0000 mg | Freq: Once | INTRAMUSCULAR | Status: AC
  Administered 2024-04-07: 125 mg via INTRAVENOUS

## 2024-04-07 MED ORDER — DIPHENHYDRAMINE HCL 50 MG/ML IJ SOLN: 12.5000 mg | INTRAMUSCULAR | Status: DC | PRN

## 2024-04-07 MED ORDER — ONDANSETRON HCL 4 MG/2ML IJ SOLN: 4.0000 mg | INTRAMUSCULAR | Status: DC | PRN

## 2024-04-07 MED ORDER — ACETAMINOPHEN 10 MG/ML IV SOLN
INTRAVENOUS | Status: AC
  Filled 2024-04-07: qty 100

## 2024-04-07 MED ORDER — BUPIVACAINE HCL (PF) 0.25 % IJ SOLN
INTRAMUSCULAR | Status: DC | PRN
  Administered 2024-04-07: 20 mL

## 2024-04-07 MED ORDER — PHENYLEPHRINE 80 MCG/ML (10ML) SYRINGE FOR IV PUSH (FOR BLOOD PRESSURE SUPPORT)
80.0000 ug | PREFILLED_SYRINGE | INTRAVENOUS | Status: DC | PRN
  Administered 2024-04-07: 80 ug via INTRAVENOUS

## 2024-04-07 MED ORDER — ACETAMINOPHEN 10 MG/ML IV SOLN
1000.0000 mg | Freq: Once | INTRAVENOUS | Status: AC | PRN
  Administered 2024-04-07: 1000 mg via INTRAVENOUS

## 2024-04-07 MED ORDER — LIDOCAINE HCL (PF) 1 % IJ SOLN
INTRAMUSCULAR | Status: DC | PRN
  Administered 2024-04-07: 4 mL via EPIDURAL
  Administered 2024-04-07: 5 mL via EPIDURAL

## 2024-04-07 MED ORDER — AMISULPRIDE (ANTIEMETIC) 5 MG/2ML IV SOLN: 10.0000 mg | Freq: Once | INTRAVENOUS | Status: DC | PRN

## 2024-04-07 MED ORDER — LACTATED RINGERS IV SOLN: 500.0000 mL | Freq: Once | INTRAVENOUS | Status: DC

## 2024-04-07 MED ORDER — OXYTOCIN BOLUS FROM INFUSION
333.0000 mL | Freq: Once | INTRAVENOUS | Status: AC
  Administered 2024-04-07: 333 mL via INTRAVENOUS

## 2024-04-07 MED ORDER — BENZOCAINE-MENTHOL 20-0.5 % EX AERO: 1.0000 | INHALATION_SPRAY | CUTANEOUS | Status: DC | PRN

## 2024-04-07 MED ORDER — FENTANYL CITRATE (PF) 100 MCG/2ML IJ SOLN
INTRAMUSCULAR | Status: AC
Start: 2024-04-07 — End: ?
  Filled 2024-04-07: qty 2

## 2024-04-07 MED ORDER — ALBUMIN HUMAN 5 % IV SOLN
INTRAVENOUS | Status: AC
Start: 2024-04-07 — End: ?
  Filled 2024-04-07: qty 250

## 2024-04-07 MED ORDER — STERILE WATER FOR IRRIGATION IR SOLN
Status: DC | PRN
  Administered 2024-04-07: 1000 mL

## 2024-04-07 MED ORDER — BUPIVACAINE HCL (PF) 0.25 % IJ SOLN
INTRAMUSCULAR | Status: AC
  Filled 2024-04-07: qty 10

## 2024-04-07 MED ORDER — ONDANSETRON HCL 4 MG/2ML IJ SOLN
INTRAMUSCULAR | Status: DC | PRN
  Administered 2024-04-07: 4 mg via INTRAVENOUS

## 2024-04-07 MED ORDER — MAGNESIUM SULFATE 40 GM/1000ML IV SOLN
1.0000 g/h | INTRAVENOUS | Status: AC
  Administered 2024-04-07 (×2): 2 g/h via INTRAVENOUS
  Filled 2024-04-07 (×2): qty 1000

## 2024-04-07 MED ORDER — ONDANSETRON HCL 4 MG/2ML IJ SOLN: 4.0000 mg | Freq: Four times a day (QID) | INTRAMUSCULAR | Status: DC | PRN

## 2024-04-07 MED ORDER — ONDANSETRON HCL 4 MG/2ML IJ SOLN
4.0000 mg | Freq: Three times a day (TID) | INTRAMUSCULAR | Status: DC | PRN
  Administered 2024-04-07: 4 mg via INTRAVENOUS

## 2024-04-07 MED ORDER — OXYCODONE HCL 5 MG PO TABS
10.0000 mg | ORAL_TABLET | ORAL | Status: DC | PRN
  Administered 2024-04-07 – 2024-04-09 (×4): 10 mg via ORAL
  Filled 2024-04-07 (×4): qty 2

## 2024-04-07 MED ORDER — OXYCODONE HCL 5 MG PO TABS
5.0000 mg | ORAL_TABLET | ORAL | Status: DC | PRN
  Administered 2024-04-08: 5 mg via ORAL
  Filled 2024-04-07: qty 1

## 2024-04-07 MED ORDER — LACTATED RINGERS IV SOLN
500.0000 mL | Freq: Once | INTRAVENOUS | Status: AC
  Administered 2024-04-07: 500 mL via INTRAVENOUS

## 2024-04-07 MED ORDER — TRANEXAMIC ACID-NACL 1000-0.7 MG/100ML-% IV SOLN: 1000.0000 mg | Freq: Once | INTRAVENOUS | Status: AC | PRN

## 2024-04-07 MED ORDER — OXYCODONE-ACETAMINOPHEN 5-325 MG PO TABS: 2.0000 | ORAL_TABLET | ORAL | Status: DC | PRN

## 2024-04-07 MED ORDER — MISOPROSTOL 25 MCG QUARTER TABLET
25.0000 ug | ORAL_TABLET | Freq: Once | ORAL | Status: AC
  Administered 2024-04-07: 25 ug via VAGINAL
  Filled 2024-04-07: qty 1

## 2024-04-07 MED ORDER — ALBUMIN HUMAN 5 % IV SOLN: INTRAVENOUS | Status: DC | PRN

## 2024-04-07 MED ORDER — FENTANYL CITRATE (PF) 100 MCG/2ML IJ SOLN
INTRAMUSCULAR | Status: AC
  Filled 2024-04-07: qty 2

## 2024-04-07 MED ORDER — MISOPROSTOL 200 MCG PO TABS
ORAL_TABLET | ORAL | Status: AC
  Administered 2024-04-07: 800 ug via RECTAL
  Filled 2024-04-07: qty 4

## 2024-04-07 MED ORDER — FENTANYL CITRATE (PF) 100 MCG/2ML IJ SOLN
25.0000 ug | INTRAMUSCULAR | Status: DC | PRN
  Administered 2024-04-07: 25 ug via INTRAVENOUS
  Administered 2024-04-07: 50 ug via INTRAVENOUS

## 2024-04-07 MED ORDER — LACTATED RINGERS IV SOLN
500.0000 mL | INTRAVENOUS | Status: DC | PRN
  Administered 2024-04-07: 250 mL via INTRAVENOUS

## 2024-04-07 MED ORDER — DIBUCAINE (PERIANAL) 1 % EX OINT: 1.0000 | TOPICAL_OINTMENT | CUTANEOUS | Status: DC | PRN

## 2024-04-07 MED ORDER — OXYCODONE HCL 5 MG/5ML PO SOLN: 5.0000 mg | Freq: Once | ORAL | Status: DC | PRN

## 2024-04-07 MED ORDER — MIDAZOLAM HCL 5 MG/5ML IJ SOLN
INTRAMUSCULAR | Status: DC | PRN
  Administered 2024-04-07 (×2): 1 mg via INTRAVENOUS

## 2024-04-07 MED ORDER — LIDOCAINE-EPINEPHRINE (PF) 2 %-1:200000 IJ SOLN
INTRAMUSCULAR | Status: AC
  Filled 2024-04-07: qty 20

## 2024-04-07 MED ORDER — EPHEDRINE 5 MG/ML INJ: 10.0000 mg | INTRAVENOUS | Status: DC | PRN

## 2024-04-07 MED ORDER — OXYTOCIN-SODIUM CHLORIDE 30-0.9 UT/500ML-% IV SOLN
1.0000 m[IU]/min | INTRAVENOUS | Status: DC
  Administered 2024-04-07: 2 m[IU]/min via INTRAVENOUS

## 2024-04-07 MED ORDER — ACETAMINOPHEN 325 MG PO TABS
650.0000 mg | ORAL_TABLET | ORAL | Status: DC | PRN
  Administered 2024-04-08 – 2024-04-09 (×4): 650 mg via ORAL
  Filled 2024-04-07 (×4): qty 2

## 2024-04-07 MED ORDER — SODIUM CHLORIDE 0.9 % IV SOLN
5.0000 10*6.[IU] | Freq: Once | INTRAVENOUS | Status: DC
  Filled 2024-04-07: qty 5

## 2024-04-07 MED ORDER — ONDANSETRON HCL 4 MG/2ML IJ SOLN
INTRAMUSCULAR | Status: AC
Start: 2024-04-07 — End: ?
  Filled 2024-04-07: qty 2

## 2024-04-07 MED ORDER — HYDRALAZINE HCL 20 MG/ML IJ SOLN: 10.0000 mg | INTRAMUSCULAR | Status: DC | PRN

## 2024-04-07 MED ORDER — SENNOSIDES-DOCUSATE SODIUM 8.6-50 MG PO TABS
2.0000 | ORAL_TABLET | Freq: Every day | ORAL | Status: DC
  Administered 2024-04-08 – 2024-04-09 (×2): 2 via ORAL
  Filled 2024-04-07 (×2): qty 2

## 2024-04-07 MED ORDER — DIPHENHYDRAMINE HCL 25 MG PO CAPS: 25.0000 mg | ORAL_CAPSULE | Freq: Four times a day (QID) | ORAL | Status: DC | PRN

## 2024-04-07 MED ORDER — LABETALOL HCL 5 MG/ML IV SOLN: 80.0000 mg | INTRAVENOUS | Status: DC | PRN

## 2024-04-07 MED ORDER — SCOPOLAMINE 1 MG/3DAYS TD PT72
1.0000 | MEDICATED_PATCH | Freq: Once | TRANSDERMAL | Status: DC
Start: 2024-04-07 — End: 2024-04-09

## 2024-04-07 MED ORDER — OXYTOCIN-SODIUM CHLORIDE 30-0.9 UT/500ML-% IV SOLN
INTRAVENOUS | Status: AC
  Filled 2024-04-07: qty 500

## 2024-04-07 MED ORDER — SIMETHICONE 80 MG PO CHEW: 80.0000 mg | CHEWABLE_TABLET | ORAL | Status: DC | PRN

## 2024-04-07 MED ORDER — WITCH HAZEL-GLYCERIN EX PADS: 1.0000 | MEDICATED_PAD | CUTANEOUS | Status: DC | PRN

## 2024-04-07 MED ORDER — COCONUT OIL OIL: 1.0000 | TOPICAL_OIL | Status: DC | PRN

## 2024-04-07 MED ORDER — EPHEDRINE 5 MG/ML INJ
10.0000 mg | INTRAVENOUS | Status: DC | PRN
  Administered 2024-04-07: 10 mg via INTRAVENOUS

## 2024-04-07 MED ORDER — LABETALOL HCL 5 MG/ML IV SOLN: 40.0000 mg | INTRAVENOUS | Status: DC | PRN

## 2024-04-07 MED ORDER — FENTANYL CITRATE (PF) 100 MCG/2ML IJ SOLN
50.0000 ug | INTRAMUSCULAR | Status: DC | PRN
Start: 2024-04-07 — End: 2024-04-07
  Administered 2024-04-07 (×2): 100 ug via INTRAVENOUS
  Filled 2024-04-07 (×2): qty 2

## 2024-04-07 MED ORDER — EPHEDRINE SULFATE-NACL 50-0.9 MG/10ML-% IV SOSY
PREFILLED_SYRINGE | INTRAVENOUS | Status: DC | PRN
  Administered 2024-04-07: 5 mg via INTRAVENOUS
  Administered 2024-04-07 (×2): 10 mg via INTRAVENOUS

## 2024-04-07 MED ORDER — MISOPROSTOL 50MCG HALF TABLET
50.0000 ug | ORAL_TABLET | Freq: Once | ORAL | Status: AC
  Administered 2024-04-07: 50 ug via ORAL
  Filled 2024-04-07: qty 1

## 2024-04-07 MED ORDER — PHENYLEPHRINE 80 MCG/ML (10ML) SYRINGE FOR IV PUSH (FOR BLOOD PRESSURE SUPPORT)
80.0000 ug | PREFILLED_SYRINGE | INTRAVENOUS | Status: DC | PRN
  Filled 2024-04-07: qty 10

## 2024-04-07 MED ORDER — PREDNISONE 20 MG PO TABS
20.0000 mg | ORAL_TABLET | Freq: Every day | ORAL | Status: DC
  Administered 2024-04-08 – 2024-04-09 (×2): 20 mg via ORAL
  Filled 2024-04-07 (×2): qty 1

## 2024-04-07 MED ORDER — LIDOCAINE-EPINEPHRINE (PF) 2 %-1:200000 IJ SOLN
INTRAMUSCULAR | Status: DC | PRN
  Administered 2024-04-07 (×2): 5 mL via EPIDURAL

## 2024-04-07 MED ORDER — LACTATED RINGERS IV SOLN: INTRAVENOUS | Status: DC

## 2024-04-07 MED ORDER — NALOXONE HCL 4 MG/10ML IJ SOLN: 1.0000 ug/kg/h | INTRAVENOUS | Status: DC | PRN

## 2024-04-07 MED ORDER — HYDROXYZINE HCL 50 MG PO TABS: 50.0000 mg | ORAL_TABLET | Freq: Four times a day (QID) | ORAL | Status: DC | PRN

## 2024-04-07 MED ORDER — HYDROMORPHONE HCL 1 MG/ML IJ SOLN
0.5000 mg | Freq: Once | INTRAMUSCULAR | Status: AC
  Administered 2024-04-07: 0.5 mg via INTRAVENOUS
  Filled 2024-04-07: qty 0.5

## 2024-04-07 MED ORDER — DIPHENHYDRAMINE HCL 25 MG PO CAPS: 25.0000 mg | ORAL_CAPSULE | ORAL | Status: DC | PRN

## 2024-04-07 MED ORDER — ONDANSETRON HCL 4 MG PO TABS: 4.0000 mg | ORAL_TABLET | ORAL | Status: DC | PRN

## 2024-04-07 MED ORDER — SOD CITRATE-CITRIC ACID 500-334 MG/5ML PO SOLN
30.0000 mL | Freq: Once | ORAL | Status: AC
  Administered 2024-04-07: 30 mL via ORAL
  Filled 2024-04-07: qty 30

## 2024-04-07 MED ORDER — MIDAZOLAM HCL 2 MG/2ML IJ SOLN
INTRAMUSCULAR | Status: AC
  Filled 2024-04-07: qty 2

## 2024-04-07 MED ORDER — OXYTOCIN-SODIUM CHLORIDE 30-0.9 UT/500ML-% IV SOLN
2.5000 [IU]/h | INTRAVENOUS | Status: DC
  Administered 2024-04-07: 2.502 [IU]/h via INTRAVENOUS
  Administered 2024-04-07: 2.5 [IU]/h via INTRAVENOUS
  Filled 2024-04-07: qty 500

## 2024-04-07 MED ORDER — LABETALOL HCL 5 MG/ML IV SOLN: 20.0000 mg | INTRAVENOUS | Status: DC | PRN

## 2024-04-07 MED ORDER — METHYLPREDNISOLONE SODIUM SUCC 125 MG IJ SOLR
125.0000 mg | Freq: Once | INTRAMUSCULAR | Status: DC | PRN
  Filled 2024-04-07: qty 2

## 2024-04-07 MED ORDER — PHENYLEPHRINE 80 MCG/ML (10ML) SYRINGE FOR IV PUSH (FOR BLOOD PRESSURE SUPPORT): 80.0000 ug | PREFILLED_SYRINGE | INTRAVENOUS | Status: DC | PRN

## 2024-04-07 MED ORDER — SCOPOLAMINE 1 MG/3DAYS TD PT72
MEDICATED_PATCH | TRANSDERMAL | Status: AC
  Filled 2024-04-07: qty 1

## 2024-04-07 MED ORDER — SOD CITRATE-CITRIC ACID 500-334 MG/5ML PO SOLN: 30.0000 mL | ORAL | Status: DC | PRN

## 2024-04-07 MED ORDER — LIDOCAINE HCL (PF) 1 % IJ SOLN: 30.0000 mL | INTRAMUSCULAR | Status: DC | PRN

## 2024-04-07 MED ORDER — SODIUM CHLORIDE 0.9% FLUSH: 3.0000 mL | INTRAVENOUS | Status: DC | PRN

## 2024-04-07 MED ORDER — ACETAMINOPHEN 325 MG PO TABS: 650.0000 mg | ORAL_TABLET | ORAL | Status: DC | PRN

## 2024-04-07 MED ORDER — SODIUM CHLORIDE 0.9 % IR SOLN
Status: DC | PRN
  Administered 2024-04-07: 1000 mL

## 2024-04-07 MED ORDER — OXYCODONE HCL 5 MG PO TABS: 5.0000 mg | ORAL_TABLET | Freq: Once | ORAL | Status: DC | PRN

## 2024-04-07 MED ORDER — IBUPROFEN 600 MG PO TABS
600.0000 mg | ORAL_TABLET | Freq: Four times a day (QID) | ORAL | Status: DC
  Administered 2024-04-07 – 2024-04-09 (×7): 600 mg via ORAL
  Filled 2024-04-07 (×8): qty 1

## 2024-04-07 MED ORDER — TRANEXAMIC ACID-NACL 1000-0.7 MG/100ML-% IV SOLN
INTRAVENOUS | Status: AC
  Administered 2024-04-07: 1000 mg via INTRAVENOUS
  Filled 2024-04-07: qty 100

## 2024-04-07 MED ORDER — FENTANYL-BUPIVACAINE-NACL 0.5-0.125-0.9 MG/250ML-% EP SOLN
12.0000 mL/h | EPIDURAL | Status: DC | PRN
  Administered 2024-04-07: 12 mL/h via EPIDURAL
  Filled 2024-04-07: qty 250

## 2024-04-07 MED ORDER — NALOXONE HCL 0.4 MG/ML IJ SOLN: 0.4000 mg | INTRAMUSCULAR | Status: DC | PRN

## 2024-04-07 MED ORDER — MISOPROSTOL 200 MCG PO TABS: 800.0000 ug | ORAL_TABLET | Freq: Once | ORAL | Status: AC | PRN

## 2024-04-07 MED ORDER — PENICILLIN G POT IN DEXTROSE 60000 UNIT/ML IV SOLN: 3.0000 10*6.[IU] | INTRAVENOUS | Status: DC

## 2024-04-07 MED ORDER — HYDROXYCHLOROQUINE SULFATE 200 MG PO TABS
200.0000 mg | ORAL_TABLET | Freq: Every day | ORAL | Status: DC
  Administered 2024-04-07: 200 mg via ORAL
  Filled 2024-04-07 (×2): qty 1

## 2024-04-07 SURGICAL SUPPLY — 23 items
BLADE SURG 11 STRL SS (BLADE) ×1 IMPLANT
CLIP FILSHIE TUBAL LIGA STRL (Clip) ×1 IMPLANT
DERMABOND ADVANCED .7 DNX12 (GAUZE/BANDAGES/DRESSINGS) IMPLANT
DISSECTOR SURG LIGASURE 21 (MISCELLANEOUS) IMPLANT
DRSG OPSITE POSTOP 3X4 (GAUZE/BANDAGES/DRESSINGS) ×1 IMPLANT
DURAPREP 26ML APPLICATOR (WOUND CARE) ×1 IMPLANT
GLOVE BIOGEL PI IND STRL 7.0 (GLOVE) ×1 IMPLANT
GLOVE BIOGEL PI IND STRL 7.5 (GLOVE) ×1 IMPLANT
GLOVE ECLIPSE 7.5 STRL STRAW (GLOVE) ×1 IMPLANT
GOWN STRL REUS W/TWL LRG LVL3 (GOWN DISPOSABLE) ×2 IMPLANT
HIBICLENS CHG 4% 4OZ BTL (MISCELLANEOUS) ×1 IMPLANT
MAT PREVALON FULL STRYKER (MISCELLANEOUS) IMPLANT
NEEDLE HYPO 22GX1.5 SAFETY (NEEDLE) ×1 IMPLANT
NS IRRIG 1000ML POUR BTL (IV SOLUTION) ×1 IMPLANT
PACK ABDOMINAL MINOR (CUSTOM PROCEDURE TRAY) ×1 IMPLANT
PROTECTOR NERVE ULNAR (MISCELLANEOUS) ×1 IMPLANT
SPONGE LAP 4X18 RFD (DISPOSABLE) IMPLANT
SUT VIC AB 0 CT1 36 (SUTURE) IMPLANT
SUT VICRYL 0 UR6 27IN ABS (SUTURE) ×1 IMPLANT
SUT VICRYL 4-0 PS2 18IN ABS (SUTURE) ×1 IMPLANT
SYR CONTROL 10ML LL (SYRINGE) ×1 IMPLANT
TOWEL OR 17X24 6PK STRL BLUE (TOWEL DISPOSABLE) ×2 IMPLANT
TRAY FOLEY W/BAG SLVR 14FR (SET/KITS/TRAYS/PACK) ×1 IMPLANT

## 2024-04-07 NOTE — Op Note (Signed)
 Brittany [name] 04/06/2024 - 04/07/2024  PREOPERATIVE DIAGNOSIS:  Undesired fertility  POSTOPERATIVE DIAGNOSIS:  Undesired fertility  PROCEDURE:  Postpartum Bilateral Tubal Sterilization using Ligasure   SURGEON:  Dr Werner Hamlet,  ASSISTANT SURGEON: Dr Keene Pastures  ANESTHESIA:  Epidural  COMPLICATIONS:  None immediate.  ESTIMATED BLOOD LOSS:  Less than 20cc.  FLUIDS: 100 mL LR.  URINE OUTPUT:  150 mL of clear urine.  INDICATIONS: 43 y.o. yo [id number]  with undesired fertility,status post vaginal delivery, desires permanent sterilization. Risks and benefits of procedure discussed with patient including permanence of method, bleeding, infection, injury to surrounding organs and need for additional procedures. Risk failure of 0.5-1% with increased risk of ectopic gestation if pregnancy occurs was also discussed with patient.   FINDINGS:  Normal uterus, tubes, and ovaries.  TECHNIQUE:  The patient was taken to the operating room where her epidural anesthesia was dosed up to surgical level and found to be adequate.  She was then placed in the dorsal supine position and prepped and draped in sterile fashion.  After an adequate timeout was performed, attention was turned to the patient's abdomen where a small transverse skin incision was made under the umbilical fold. The incision was taken down to the layer of fascia using the scalpel, and fascia was incised, and extended bilaterally using Mayo scissors. The peritoneum was entered in a sharp fashion.   Attention was then turned to the patient's adnexa. The left fallopian tube was identified and followed out to the fimbriated end.  Ligasure device was used to cauterize and cut the mesosalpinx to proximal end of the fallopian tube, removing 6cm of tube. A similar process was carried out on the right side allowing for bilateral tubal sterilization.    Good hemostasis was noted overall.  Local analgesia was drizzled on both operative  sites.The instruments were then removed from the patient's abdomen and the fascial incision was repaired with 0 Vicryl, and the skin was closed with a 3-0 Monocryl subcuticular stitch. The patient tolerated the procedure well.  Sponge, lap, and needle counts were correct times two.  The patient was then taken to the recovery room awake, extubated and in stable condition.   [name] J, DO 04/07/2024 2:07 PM

## 2024-04-07 NOTE — Progress Notes (Signed)
 LABOR PROGRESS NOTE  Patient Name: Brittany [name], female   DOB: 11/21/1981, 43 y.o.  MRN: [id number]  Patient was feeling uncomfortable contractions after cytotec but they have spaced out somewhat after fentanyl . Is amenable to check/AROM. Cervix 3.5/60/-1. R/B/A of AROM discussed with patient, and verbal consent obtained. Babe's head found to be well-applied. Obtained  small amount of  blood-tinged fluid. Mom and babe tolerated well. Cat I currently with intermittent periods of Cat I for elevated baseline or minimal variability.  #preE w/SF: HA improved with fentanyl . Pressures remain normotensive to mild range. Continue mag for seizure ppx. Q12 labs.  Maud Sorenson, MD

## 2024-04-07 NOTE — Anesthesia Preprocedure Evaluation (Addendum)
 Anesthesia Evaluation  Patient identified by MRN, date of birth, ID band Patient awake    Reviewed: Allergy & Precautions, NPO status , Patient's Chart, lab work & pertinent test results  History of Anesthesia Complications Negative for: history of anesthetic complications  Airway Mallampati: II   Neck ROM: Full    Dental   Pulmonary neg pulmonary ROS   Pulmonary exam normal        Cardiovascular hypertension (Pre-E), Normal cardiovascular exam     Neuro/Psych negative neurological ROS  negative psych ROS   GI/Hepatic negative GI ROS, Neg liver ROS,,,  Endo/Other   Obesity   Renal/GU negative Renal ROS     Musculoskeletal negative musculoskeletal ROS (+)    Abdominal   Peds  Hematology  (+) Blood dyscrasia, anemia  Plt 209k    Anesthesia Other Findings SLE with flare Sarcoidosis   Reproductive/Obstetrics  Delivered earlier today                              Anesthesia Physical Anesthesia Plan  ASA: 3  Anesthesia Plan: Epidural   Post-op Pain Management: Minimal or no pain anticipated   Induction:   PONV Risk Score and Plan: 2 and Treatment may vary due to age or medical condition  Airway Management Planned: Natural Airway  Additional Equipment: None  Intra-op Plan:   Post-operative Plan:   Informed Consent: I have reviewed the patients History and Physical, chart, labs and discussed the procedure including the risks, benefits and alternatives for the proposed anesthesia with the patient or authorized representative who has indicated his/her understanding and acceptance.       Plan Discussed with: Anesthesiologist and CRNA  Anesthesia Plan Comments: (Epidural worked well for labor. Plan to use for PPTL with backup plan of GETA. Patient expressed understanding. )       Anesthesia Quick Evaluation

## 2024-04-07 NOTE — Discharge Summary (Signed)
 Postpartum Discharge Summary  Date of Service updated***     Patient Name: Brittany [name] DOB: 07-Aug-1981 MRN: [id number]  Date of admission: 04/06/2024 Delivery date:04/07/2024 Delivering provider: Lemuel Quaker R Date of discharge: 04/07/2024  Admitting diagnosis: Preeclampsia, severe, third trimester [O14.13] Intrauterine pregnancy: [redacted]w[redacted]d     Secondary diagnosis:  Principal Problem:   Preeclampsia, severe, third trimester  Additional problems: ***    Discharge diagnosis: Preterm Pregnancy Delivered and Preeclampsia (severe)                                              Post partum procedures:postpartum tubal ligation Augmentation: AROM, Pitocin , and Cytotec Complications: None  Hospital course: Induction of Labor With Vaginal Delivery   43 y.o. yo [id number] at [redacted]w[redacted]d was admitted to the hospital 04/06/2024 for induction of labor.  Indication for induction: Preeclampsia.  Patient had an labor course complicated by preeclampsia with magnesium infusion. Membrane Rupture Time/Date: 5:55 AM,04/07/2024  Delivery Method:Vaginal, Spontaneous Operative Delivery:N/A Episiotomy: None Lacerations:  None Details of delivery can be found in separate delivery note.  Patient had a postpartum course complicated by***. Patient is discharged home 04/07/24.  Newborn Data: Birth date:04/07/2024 Birth time:11:42 AM Gender:Female Living status:Living Apgars:7 ,9  Weight:5 lb 3.6 oz (2.37 kg)  Magnesium Sulfate received: Yes: Seizure prophylaxis BMZ received: No Rhophylac:N/A MMR:N/A T-DaP:Given prenatally Flu: N/A RSV Vaccine received: No Transfusion:{Transfusion received:30440034}  Immunizations received: Immunization History  Administered Date(s) Administered   Tdap 03/24/2024    Physical exam  Vitals:   04/07/24 1215 04/07/24 1220 04/07/24 1221 04/07/24 1230  BP: 103/61 92/60 96/63  101/66  Pulse: 79 77 78 81  Resp:      Temp:      TempSrc:      SpO2:    97%  Weight:       Height:       General: {Exam; general:21111117} Lochia: {Desc; appropriate/inappropriate:30686::"appropriate"} Uterine Fundus: {Desc; firm/soft:30687} Incision: {Exam; incision:21111123} DVT Evaluation: {Exam; dvt:2111122} Labs: Lab Results  Component Value Date   WBC 4.2 04/07/2024   HGB 10.4 (L) 04/07/2024   HCT 30.4 (L) 04/07/2024   MCV 92.1 04/07/2024   PLT 209 04/07/2024      Latest Ref Rng & Units 04/07/2024    5:18 AM  CMP  Glucose 70 - 99 mg/dL 87   BUN 6 - 20 mg/dL <5   Creatinine 6.96 - 1.00 mg/dL 2.95   Sodium 284 - 132 mmol/L 133   Potassium 3.5 - 5.1 mmol/L 3.6   Chloride 98 - 111 mmol/L 107   CO2 22 - 32 mmol/L 17   Calcium  8.9 - 10.3 mg/dL 8.2   Total Protein 6.5 - 8.1 g/dL 6.3   Total Bilirubin 0.0 - 1.2 mg/dL 0.9   Alkaline Phos 38 - 126 U/L 139   AST 15 - 41 U/L 29   ALT 0 - 44 U/L 13    Edinburgh Score:    05/01/2019    1:30 PM  Edinburgh Postnatal Depression Scale Screening Tool  I have been able to laugh and see the funny side of things. 0  I have looked forward with enjoyment to things. 1  I have blamed myself unnecessarily when things went wrong. 1  I have been anxious or worried for no good reason. 2  I have felt scared or panicky for no good reason. 1  Things have  been getting on top of me. 0  I have been so unhappy that I have had difficulty sleeping. 1  I have felt sad or miserable. 1  I have been so unhappy that I have been crying. 0  The thought of harming myself has occurred to me. 0  Edinburgh Postnatal Depression Scale Total 7   No data recorded  After visit meds:  Allergies as of 04/07/2024       Reactions   Latex Hives   Strawberry (diagnostic) Rash     Med Rec must be completed prior to using this [facility]***     Discharge home in stable condition Infant Feeding: {Baby feeding:23562} Infant Disposition:{CHL IP OB HOME WITH HYQMVH:84696} Discharge instruction: per After Visit Summary and Postpartum  booklet. Activity: Advance as tolerated. Pelvic rest for 6 weeks.  Diet: {OB EXBM:84132440}  Future Appointments: Future Appointments  Date Time Provider Department Center  05/08/2024 11:15 AM Stinson, Jacob J, DO [facility] None   Follow up Visit: Message sent to schedule BP check, PP visit already scheduled. Please schedule this patient for a In person postpartum visit in 4 weeks with the following provider: MD Cathyann Cobia) Additional Postpartum F/U:BP check 1 week  High risk pregnancy complicated by:  preeclampsia Delivery mode:  Vaginal, Spontaneous Anticipated Birth Control:  BTL done PP   04/07/2024 Derick Fleeting, CNM

## 2024-04-07 NOTE — Transfer of Care (Signed)
 Immediate Anesthesia Transfer of Care Note  Patient: Brittany [name]  Procedure(s) Performed: LIGATION, FALLOPIAN TUBE, POSTPARTUM (Bilateral: Abdomen)  Patient Location: PACU  Anesthesia Type:Epidural  Level of Consciousness: awake  Airway & Oxygen Therapy: Patient Spontanous Breathing  Post-op Assessment: Report given to RN and Post -op Vital signs reviewed and stable  Post vital signs: Reviewed and stable  Last Vitals:  Vitals Value Taken Time  BP 91/54 04/07/24 1435  Temp 35.3 C 04/07/24 1433  Pulse 81 04/07/24 1437  Resp 13 04/07/24 1437  SpO2 97 % 04/07/24 1437  Vitals shown include unfiled device data.  Last Pain:  Vitals:   04/07/24 1433  TempSrc: Temporal  PainSc:          Complications: No notable events documented.

## 2024-04-07 NOTE — Anesthesia Preprocedure Evaluation (Signed)
 Anesthesia Evaluation  Patient identified by MRN, date of birth, ID band Patient awake    Reviewed: Allergy & Precautions, NPO status , Patient's Chart, lab work & pertinent test results  History of Anesthesia Complications Negative for: history of anesthetic complications  Airway Mallampati: II   Neck ROM: Full    Dental   Pulmonary neg pulmonary ROS   Pulmonary exam normal        Cardiovascular hypertension (Pre-E), Normal cardiovascular exam     Neuro/Psych negative neurological ROS  negative psych ROS   GI/Hepatic negative GI ROS, Neg liver ROS,,,  Endo/Other   Obesity   Renal/GU negative Renal ROS     Musculoskeletal negative musculoskeletal ROS (+)    Abdominal   Peds  Hematology  Plt 209k    Anesthesia Other Findings SLE with flare Sarcoidosis   Reproductive/Obstetrics (+) Pregnancy                             Anesthesia Physical Anesthesia Plan  ASA: 3  Anesthesia Plan: Epidural   Post-op Pain Management: Minimal or no pain anticipated   Induction:   PONV Risk Score and Plan: 2 and Treatment may vary due to age or medical condition  Airway Management Planned: Natural Airway  Additional Equipment: None  Intra-op Plan:   Post-operative Plan:   Informed Consent: I have reviewed the patients History and Physical, chart, labs and discussed the procedure including the risks, benefits and alternatives for the proposed anesthesia with the patient or authorized representative who has indicated his/her understanding and acceptance.       Plan Discussed with: Anesthesiologist  Anesthesia Plan Comments: (Labs reviewed. Platelets acceptable, patient not taking any blood thinning medications. Per RN, FHR tracing reported to be stable enough for sitting procedure. Risks and benefits discussed with patient, including PDPH, backache, epidural hematoma, failed epidural, blood  pressure changes, allergic reaction, and nerve injury. Patient expressed understanding and wished to proceed.)       Anesthesia Quick Evaluation

## 2024-04-07 NOTE — Progress Notes (Signed)
 Patient ID: Brittany [name], female   DOB: Oct 01, 1981, 43 y.o.   MRN: [id number]  Risks of procedure discussed with patient including but not limited to: risk of regret, permanence of method, bleeding, infection, injury to surrounding organs and need for additional procedures.  Failure risk of 1 -2 % with increased risk of ectopic gestation if pregnancy occurs was also discussed with patient.    Brittany Marzano J, DO 04/07/2024 12:41 PM

## 2024-04-07 NOTE — Progress Notes (Signed)
 Labor Progress Note Brittany [name] is a 43 y.o. [id number] at [redacted]w[redacted]d presented for IOL d/t pre-E with severe features.  S: Patient is resting comfortably s/p epidural placement  O:  BP 125/84   Pulse (!) 107   Temp (!) 97.5 F (36.4 C) (Oral)   Resp 17   Ht 5\' 8"  (1.727 m)   Wt 90 kg   LMP  (LMP Unknown)   SpO2 100%   BMI 30.18 kg/m  EFM: 130s/minimal variability/variable decels  CVE: Dilation: 3 Effacement (%): 70 Cervical Position: Middle Station: -2 Presentation: Vertex Exam by:: Vangie Genet, RN   A&P: 43 y.o. [id number] [redacted]w[redacted]d for IOL d/t severe pre-e #Labor: Progressing well. Pitocin  running, titration as needed per nursing. #Pain: epidural #FWB: Cat 2 following mag and epidural, recovering  #GBS negative #Pre-E- pressures stable, Mag 4.4 w/ appropriate urine output #SLE/Sarcoid- initial stress dose of steroids given, continue home plaquenil , plan for repeat stress dose of steroids after delivery  Rayma Calandra, DO Center for [employer], [facility] 9:05 AM

## 2024-04-07 NOTE — Anesthesia Procedure Notes (Signed)
 Epidural Patient location during procedure: OB Start time: 04/07/2024 8:03 AM End time: 04/07/2024 8:06 AM  Staffing Anesthesiologist: Juventino Oppenheim, MD Performed: anesthesiologist   Preanesthetic Checklist Completed: patient identified, IV checked, risks and benefits discussed, monitors and equipment checked, pre-op evaluation and timeout performed  Epidural Patient position: sitting Prep: DuraPrep Patient monitoring: continuous pulse ox and blood pressure Approach: midline Location: L2-L3 Injection technique: LOR saline  Needle:  Needle type: Tuohy  Needle gauge: 17 G Needle length: 9 cm Needle insertion depth: 7 cm Catheter size: 19 Gauge Catheter at skin depth: 12 cm Test dose: negative and Other (1% lidocaine )  Assessment Events: blood not aspirated and no cerebrospinal fluid  Additional Notes Patient identified. Risks including, but not limited to, bleeding, infection, nerve damage, paralysis, inadequate analgesia, blood pressure changes, nausea, vomiting, allergic reaction, postpartum back pain, itching, and headache were discussed. Patient expressed understanding and wished to proceed. Sterile prep and drape, including hand hygiene, mask, and sterile gloves were used. The patient was positioned and the spine was prepped. The skin was anesthetized with lidocaine . No paraesthesia or other complication noted. The patient did not experience any signs of intravascular injection such as tinnitus or metallic taste in mouth, nor signs of intrathecal spread such as rapid motor block. Please see nursing notes for vital signs. The patient tolerated the procedure well.   Hobart Lulas, MDReason for block:procedure for pain

## 2024-04-07 NOTE — H&P (Addendum)
 LABOR AND DELIVERY ADMISSION HISTORY AND PHYSICAL NOTE  Brittany [name] is a 43 y.o. female [phone] with IUP at [redacted]w[redacted]d presenting for headache and elevated blood pressure.  Found to rule in for preeclampsia with severe features in MAU.  Patient reports the fetal movement as active. Patient reports uterine contraction activity as having regular contractions but these are asymptomatic. Patient reports vaginal bleeding as none. Patient describes fluid per vagina as None.   Patient presents with headache. Mostly on R side. Throbbing in nature. Tylenol  minimally helpful. Is sensitive to light and noise. Additionally had an elevated BP at home. Has not had problems with HTN but has been monitoring at home given high risk for this complication. Denies vision changes, extremity swelling, RUQ pain, CP, SOB.   She plans on bottle feeding. Her contraception plan is: bilateral tubal ligation.  Prenatal History/Complications: PNC at [facility] - SLE - Sarcoid - AFP elevated - AMA  Sono:  @[redacted]w[redacted]d , CWD, normal anatomy, cephalic presentation, left lateral placenta, 59%ile  Pregnancy complications:  Patient Active Problem List   Diagnosis Date Noted   Preeclampsia, severe, third trimester 04/07/2024   Multigravida of advanced maternal age in third trimester 02/29/2024   Supervision of high risk pregnancy in third trimester 02/29/2024   Lupus anticoagulant complicating pregnancy, antepartum (HCC) 02/28/2024   Abnormal maternal serum screening test-AFP Tetra Pos  DSR 1:27 01/02/2024   Supervision of other high risk pregnancy, antepartum 12/18/2023   Sarcoidosis 10/22/2018   Systemic lupus erythematosus (HCC) 05/23/2016    Past Medical History: Past Medical History:  Diagnosis Date   Abnormal Pap smear of cervix 08/27/2020   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   Anemia    Anemia, unspecified 02/04/2018   Lupus    Sarcoidosis    Seasonal and perennial allergic rhinitis 02/28/2018   Urticaria     Past Surgical History: Past Surgical History:  Procedure Laterality Date   BIOPSY THYROID      BREAST BIOPSY Right 10/09/2022   MM RT BREAST BX W LOC DEV 1ST LESION IMAGE BX SPEC STEREO GUIDE 10/09/2022 [facility]   BREAST BIOPSY Right 10/25/2022   MM RT BREAST BX W LOC DEV 1ST LESION IMAGE BX SPEC STEREO GUIDE 10/25/2022 [facility]   DILATION AND CURETTAGE OF UTERUS     DILATION AND CURETTAGE, DIAGNOSTIC / THERAPEUTIC  01/04/2018    Obstetrical History: OB History     Gravida  6   Para  2   Term  1   Preterm  1   AB  3   Living  3      SAB  2   IAB  1   Ectopic      Multiple  1   Live Births  3           Social History: Social History   Socioeconomic History   Marital status: Married    Spouse name: Not on file   Number of children: Not on file   Years of education: Not on file   Highest education level: Not on file  Occupational History   Not on file  Tobacco Use   Smoking status: Never   Smokeless tobacco: Never  Vaping Use   Vaping status: Never Used  Substance and Sexual Activity  Alcohol use: Not Currently    Alcohol/week: 1.0 standard drink of alcohol    Types: 1 Glasses of wine per week    Comment: social drinker   Drug use: Never   Sexual activity: Yes    Birth control/protection: None  Other Topics Concern   Not on file  Social History Narrative   Not on file   Social Drivers of Health   Financial Resource Strain: Not on file  Food Insecurity: No Food Insecurity (04/07/2024)   Hunger Vital Sign    Worried About Running Out of Food in the Last Year: Never true    Ran Out of Food in the Last Year: Never true  Transportation Needs: No Transportation Needs (04/07/2024)   PRAPARE - Administrator, Civil Service (Medical):  No    Lack of Transportation (Non-Medical): No  Physical Activity: Not on file  Stress: Not on file  Social Connections: Not on file    Family History: Family History  Problem Relation Age of Onset   Hypertension Mother    Anemia Mother    Food Allergy Mother        red dye, tomato, strawberry   Allergic rhinitis Father    Prostate cancer Father    Heart disease Father    Allergy (severe) Father        insect bee stings   Allergic rhinitis Brother    Hypertension Brother    Heart disease Paternal Grandfather     Allergies: Allergies  Allergen Reactions   Latex Hives   Strawberry (Diagnostic) Rash    Medications Prior to Admission  Medication Sig Dispense Refill Last Dose/Taking   acetaminophen  (TYLENOL ) 325 MG tablet Take 2 tablets (650 mg total) by mouth every 4 (four) hours as needed (for pain scale < 4). 30 tablet 0 04/06/2024   aspirin EC 81 MG tablet Take 81 mg by mouth daily. Swallow whole.   Past Week   Prenatal Vit-Fe Fumarate-FA (MULTIVITAMIN-PRENATAL) 27-0.8 MG TABS tablet Take 1 tablet by mouth daily at 12 noon.   04/06/2024   Cyanocobalamin  (B-12 COMPLIANCE INJECTION IJ) Inject as directed. (Patient not taking: Reported on 04/02/2024)      cyclobenzaprine  (FLEXERIL ) 5 MG tablet Take 1-2 tablets (5-10 mg total) by mouth 3 (three) times daily as needed for muscle spasms. 20 tablet 0    famotidine  (PEPCID ) 40 MG tablet Take 1 tablet (40 mg total) by mouth daily. 30 tablet 0    hydroxychloroquine  (PLAQUENIL ) 200 MG tablet Take by mouth.      hydrOXYzine  (ATARAX ) 25 MG tablet Take 1 tablet (25 mg total) by mouth 3 (three) times daily as needed for itching. 30 tablet 0    loratadine  (CLARITIN ) 10 MG tablet Take 1 tablet (10 mg total) by mouth daily. 30 tablet 0    ondansetron  (ZOFRAN -ODT) 4 MG disintegrating tablet Take 1 tablet (4 mg total) by mouth every 8 (eight) hours as needed for nausea or vomiting. 20 tablet 0    predniSONE  (DELTASONE ) 20 MG tablet Take 20 mg by  mouth daily with breakfast.        Review of Systems  All systems reviewed and negative except as stated in HPI  Physical Exam BP 138/82   Pulse 100   Temp 98.2 F (36.8 C) (Oral)   Resp 16   Ht 5\' 8"  (1.727 m)   Wt 90 kg   LMP  (LMP Unknown)   BMI 30.18 kg/m   Physical Exam Constitutional:  General: She is not in acute distress.    Comments: Appears uncomfortable, fatigued  Cardiovascular:     Rate and Rhythm: Normal rate and regular rhythm.  Pulmonary:     Effort: Pulmonary effort is normal.  Abdominal:     Comments: Gravid  Genitourinary:    General: Normal vulva.     Rectum: Normal.  Musculoskeletal:        General: No swelling.  Skin:    General: Skin is warm and dry.     Findings: Rash present.     Comments: Malar rash on face, rash on fingertips  Neurological:     General: No focal deficit present.  Psychiatric:        Mood and Affect: Mood normal.   Presentation: cephalic by exam  Fetal monitoring: Baseline: 150 bpm, Variability: Good {> 6 bpm), Accelerations: Reactive, and Decelerations: Absent Uterine activity: 2-4 - asymptomatic  Cervix 2/60/-2  Prenatal labs: ABO, Rh: --/--/A POS (05/04 2345) Antibody: NEG (05/04 2345) Rubella: 7.85 (01/22 1050) RPR: Non Reactive (02/27 1040)  HBsAg: Negative (01/22 1050)  HIV: Non Reactive (02/27 1040)  GC/Chlamydia:  Neisseria Gonorrhea  Date Value Ref Range Status  04/02/2024 Negative  Final   Chlamydia  Date Value Ref Range Status  04/02/2024 Negative  Final   GBS: Negative/-- (04/30 1610)   Prenatal Transfer Tool  Maternal Diabetes: No Genetic Screening: abnormal AFP tetra, NIPS normal Maternal Ultrasounds/Referrals: Normal Fetal Ultrasounds or other Referrals:  Referred to Materal Fetal Medicine  - Normal fetal echo Maternal Substance Abuse:  No Significant Maternal Medications: ASA, plaquenil , prednisone  20 mg QD Significant Maternal Lab Results: GBS unknown  Results for orders placed  or performed during the hospital encounter of 04/06/24 (from the past 24 hours)  CBC   Collection Time: 04/06/24 11:44 PM  Result Value Ref Range   WBC 3.2 (L) 4.0 - 10.5 K/uL   RBC 3.16 (L) 3.87 - 5.11 MIL/uL   Hemoglobin 10.6 (L) 12.0 - 15.0 g/dL   HCT 96.0 (L) 45.4 - 09.8 %   MCV 94.0 80.0 - 100.0 fL   MCH 33.5 26.0 - 34.0 pg   MCHC 35.7 30.0 - 36.0 g/dL   RDW 11.9 (H) 14.7 - 82.9 %   Platelets 206 150 - 400 K/uL   nRBC 0.0 0.0 - 0.2 %  Comprehensive metabolic panel   Collection Time: 04/06/24 11:44 PM  Result Value Ref Range   Sodium 133 (L) 135 - 145 mmol/L   Potassium 4.1 3.5 - 5.1 mmol/L   Chloride 107 98 - 111 mmol/L   CO2 16 (L) 22 - 32 mmol/L   Glucose, Bld 83 70 - 99 mg/dL   BUN 5 (L) 6 - 20 mg/dL   Creatinine, Ser 5.62 0.44 - 1.00 mg/dL   Calcium  8.5 (L) 8.9 - 10.3 mg/dL   Total Protein 6.2 (L) 6.5 - 8.1 g/dL   Albumin 1.9 (L) 3.5 - 5.0 g/dL   AST 31 15 - 41 U/L   ALT 12 0 - 44 U/L   Alkaline Phosphatase 136 (H) 38 - 126 U/L   Total Bilirubin 0.6 0.0 - 1.2 mg/dL   GFR, Estimated >13 >08 mL/min   Anion gap 10 5 - 15  Protein / creatinine ratio, urine   Collection Time: 04/06/24 11:44 PM  Result Value Ref Range   Creatinine, Urine 48 mg/dL   Total Protein, Urine 115 mg/dL   Protein Creatinine Ratio 2.40 (H) 0.00 - 0.15 mg/mg[Cre]  Type and  screen [facility]   Collection Time: 04/06/24 11:45 PM  Result Value Ref Range   ABO/RH(D) A POS    Antibody Screen NEG    Sample Expiration      04/09/2024,2359 Performed at [facility], [address], [place], [place] [address]   Wet prep, genital   Collection Time: 04/06/24 11:46 PM  Result Value Ref Range   Yeast Wet Prep HPF POC NONE SEEN NONE SEEN   Trich, Wet Prep NONE SEEN NONE SEEN   Clue Cells Wet Prep HPF POC PRESENT (A) NONE SEEN   WBC, Wet Prep HPF POC >=10 (A) <10   Sperm NONE SEEN     Assessment: Milani A [name] is a 43 y.o. [id number] at [redacted]w[redacted]d here for IOL 2/2 preE  w/SF.  #Labor: Start with dual cytotec. Discussed roles for FB, AROM, pit #Pain: IV pain meds PRN, epidural upon request #FHT: Category I #GBS/ID: Cx negative #MOF: bottle feeding #MOC: bilateral tubal ligation  #Lupus (nephritis)/sarcoidosis: active with current flare - Continue plaquenil  - Stress dose steroids. Solumedrol 125 mg IV x1 now and repeat at delivery. Will then transition back to home prednisone  20 mg every day - No contraindication for vaginal delivery per MFM - Follow-up with rheum postpartum  #Elevated AFP: Normal NIPS, ultrasound, and fetal echo. Patient declined amnio  Maud Sorenson, MD [facility] Fellow Center for [facility], [facility]  04/07/2024, 2:21 AM

## 2024-04-08 LAB — GC/CHLAMYDIA PROBE AMP (~~LOC~~) NOT AT ARMC
Chlamydia: NEGATIVE
Comment: NEGATIVE
Comment: NORMAL
Neisseria Gonorrhea: NEGATIVE

## 2024-04-08 LAB — CBC
HCT: 29.9 % — ABNORMAL LOW (ref 36.0–46.0)
Hemoglobin: 10.2 g/dL — ABNORMAL LOW (ref 12.0–15.0)
MCH: 31.2 pg (ref 26.0–34.0)
MCHC: 34.1 g/dL (ref 30.0–36.0)
MCV: 91.4 fL (ref 80.0–100.0)
Platelets: 187 10*3/uL (ref 150–400)
RBC: 3.27 MIL/uL — ABNORMAL LOW (ref 3.87–5.11)
RDW: 16.2 % — ABNORMAL HIGH (ref 11.5–15.5)
WBC: 5.1 10*3/uL (ref 4.0–10.5)
nRBC: 0 % (ref 0.0–0.2)

## 2024-04-08 LAB — COMPREHENSIVE METABOLIC PANEL WITH GFR
ALT: 9 U/L (ref 0–44)
AST: 25 U/L (ref 15–41)
Albumin: 1.6 g/dL — ABNORMAL LOW (ref 3.5–5.0)
Alkaline Phosphatase: 111 U/L (ref 38–126)
Anion gap: 12 (ref 5–15)
BUN: 5 mg/dL — ABNORMAL LOW (ref 6–20)
CO2: 16 mmol/L — ABNORMAL LOW (ref 22–32)
Calcium: 6.8 mg/dL — ABNORMAL LOW (ref 8.9–10.3)
Chloride: 103 mmol/L (ref 98–111)
Creatinine, Ser: 0.62 mg/dL (ref 0.44–1.00)
GFR, Estimated: >60 mL/min (ref >60)
Glucose, Bld: 102 mg/dL — ABNORMAL HIGH (ref 70–99)
Potassium: 3.4 mmol/L — ABNORMAL LOW (ref 3.5–5.1)
Sodium: 131 mmol/L — ABNORMAL LOW (ref 135–145)
Total Bilirubin: 0.5 mg/dL (ref 0.0–1.2)
Total Protein: 5.4 g/dL — ABNORMAL LOW (ref 6.5–8.1)

## 2024-04-08 LAB — CULTURE, BETA STREP (GROUP B ONLY)

## 2024-04-08 LAB — MAGNESIUM: Magnesium: 7.3 mg/dL (ref 1.7–2.4)

## 2024-04-08 MED ORDER — LACTATED RINGERS IV SOLN
INTRAVENOUS | Status: AC
Start: 2024-04-08 — End: 2024-04-09

## 2024-04-08 MED ORDER — NIFEDIPINE ER OSMOTIC RELEASE 30 MG PO TB24
30.0000 mg | ORAL_TABLET | Freq: Every day | ORAL | Status: DC
  Administered 2024-04-08 – 2024-04-09 (×2): 30 mg via ORAL
  Filled 2024-04-08 (×2): qty 1

## 2024-04-08 NOTE — Plan of Care (Signed)
  Problem: Education: Goal: Knowledge of disease or condition will improve Outcome: Progressing Goal: Knowledge of the prescribed therapeutic regimen will improve Outcome: Progressing   Problem: Fluid Volume: Goal: Peripheral tissue perfusion will improve Outcome: Progressing   Problem: Clinical Measurements: Goal: Complications related to disease process, condition or treatment will be avoided or minimized Outcome: Progressing   Problem: Education: Goal: Knowledge of General Education information will improve Description: Including pain rating scale, medication(s)/side effects and non-pharmacologic comfort measures Outcome: Progressing   Problem: Health Behavior/Discharge Planning: Goal: Ability to manage health-related needs will improve Outcome: Progressing

## 2024-04-08 NOTE — Progress Notes (Addendum)
 POSTPARTUM PROGRESS NOTE  PPD#1/POD#1  Subjective:  Brittany [name] is a 43 y.o. [id number] s/p NSVD and bilateral salpingectomy at 101w5d. Today she notes she is doing okay. She denies any problems with ambulating, voiding or po intake. Denies nausea or vomiting. She has not yet passed flatus or had a BM.  Pain is manageable.  Lochia minimal Denies fever/chills/chest pain/SOB.  No headache, no right upper quadrant pain  Objective: Blood pressure 137/84, pulse 88, temperature 97.8 F (36.6 C), temperature source Oral, resp. rate 17, height 5\' 8"  (1.727 m), weight 90 kg, SpO2 95%, unknown if currently breastfeeding.  Physical Exam:  General: alert, cooperative and no distress Chest: no respiratory distress Heart: regular rate and rhythm Abdomen: soft, nontender, mild distension, +BS Uterine Fundus: firm, appropriately tender Incision: C/D/I with dermabond DVT Evaluation: No calf swelling or tenderness Extremities: no edema Skin: warm, dry  Results for orders placed or performed during the hospital encounter of 04/06/24 (from the past 24 hours)  CBC     Status: Abnormal   Collection Time: 04/07/24  2:51 PM  Result Value Ref Range   WBC 3.8 (L) 4.0 - 10.5 K/uL   RBC 3.21 (L) 3.87 - 5.11 MIL/uL   Hemoglobin 9.9 (L) 12.0 - 15.0 g/dL   HCT 45.4 (L) 09.8 - 11.9 %   MCV 92.2 80.0 - 100.0 fL   MCH 30.8 26.0 - 34.0 pg   MCHC 33.4 30.0 - 36.0 g/dL   RDW 14.7 (H) 82.9 - 56.2 %   Platelets 173 150 - 400 K/uL   nRBC 0.0 0.0 - 0.2 %  Magnesium     Status: Abnormal   Collection Time: 04/07/24  2:51 PM  Result Value Ref Range   Magnesium 6.4 (HH) 1.7 - 2.4 mg/dL  CBC     Status: Abnormal   Collection Time: 04/07/24  5:20 PM  Result Value Ref Range   WBC 4.1 4.0 - 10.5 K/uL   RBC 2.83 (L) 3.87 - 5.11 MIL/uL   Hemoglobin 9.6 (L) 12.0 - 15.0 g/dL   HCT 13.0 (L) 86.5 - 78.4 %   MCV 96.8 80.0 - 100.0 fL   MCH 33.9 26.0 - 34.0 pg   MCHC 35.0 30.0 - 36.0 g/dL   RDW 69.6 (H) 29.5 -  15.5 %   Platelets 151 150 - 400 K/uL   nRBC 0.0 0.0 - 0.2 %  Comprehensive metabolic panel     Status: Abnormal   Collection Time: 04/07/24  5:20 PM  Result Value Ref Range   Sodium 133 (L) 135 - 145 mmol/L   Potassium 3.8 3.5 - 5.1 mmol/L   Chloride 107 98 - 111 mmol/L   CO2 14 (L) 22 - 32 mmol/L   Glucose, Bld 138 (H) 70 - 99 mg/dL   BUN 5 (L) 6 - 20 mg/dL   Creatinine, Ser 2.84 0.44 - 1.00 mg/dL   Calcium  7.9 (L) 8.9 - 10.3 mg/dL   Total Protein 5.1 (L) 6.5 - 8.1 g/dL   Albumin 1.7 (L) 3.5 - 5.0 g/dL   AST 30 15 - 41 U/L   ALT 6 0 - 44 U/L   Alkaline Phosphatase 107 38 - 126 U/L   Total Bilirubin 0.6 0.0 - 1.2 mg/dL   GFR, Estimated >13 >24 mL/min   Anion gap 12 5 - 15  Magnesium     Status: Abnormal   Collection Time: 04/07/24  5:20 PM  Result Value Ref Range   Magnesium 6.5 (HH) 1.7 -  2.4 mg/dL  CBC     Status: Abnormal   Collection Time: 04/08/24  5:00 AM  Result Value Ref Range   WBC 5.1 4.0 - 10.5 K/uL   RBC 3.27 (L) 3.87 - 5.11 MIL/uL   Hemoglobin 10.2 (L) 12.0 - 15.0 g/dL   HCT 69.6 (L) 29.5 - 28.4 %   MCV 91.4 80.0 - 100.0 fL   MCH 31.2 26.0 - 34.0 pg   MCHC 34.1 30.0 - 36.0 g/dL   RDW 13.2 (H) 44.0 - 10.2 %   Platelets 187 150 - 400 K/uL   nRBC 0.0 0.0 - 0.2 %  Comprehensive metabolic panel     Status: Abnormal   Collection Time: 04/08/24  5:00 AM  Result Value Ref Range   Sodium 131 (L) 135 - 145 mmol/L   Potassium 3.4 (L) 3.5 - 5.1 mmol/L   Chloride 103 98 - 111 mmol/L   CO2 16 (L) 22 - 32 mmol/L   Glucose, Bld 102 (H) 70 - 99 mg/dL   BUN 5 (L) 6 - 20 mg/dL   Creatinine, Ser 7.25 0.44 - 1.00 mg/dL   Calcium  6.8 (L) 8.9 - 10.3 mg/dL   Total Protein 5.4 (L) 6.5 - 8.1 g/dL   Albumin 1.6 (L) 3.5 - 5.0 g/dL   AST 25 15 - 41 U/L   ALT 9 0 - 44 U/L   Alkaline Phosphatase 111 38 - 126 U/L   Total Bilirubin 0.5 0.0 - 1.2 mg/dL   GFR, Estimated >36 >64 mL/min   Anion gap 12 5 - 15  Magnesium     Status: Abnormal   Collection Time: 04/08/24  5:00 AM   Result Value Ref Range   Magnesium 7.3 (HH) 1.7 - 2.4 mg/dL    Assessment/Plan: Brittany [name] is a 43 y.o. [id number] s/p NSVD- PPD#1 and bilateral salpingectomy POD#1 complicated by: 1) preeclampsia with severe features - Currently on mag, level reviewed and decreased to 1 mg/hr, plan to discontinue later this am - Currently asymptomatic, labs stable -BP stable, will continue to monitor  2) Lupus/Sarcoidosis -s/p stress dose steroid -continue home prednisone  20mg  daily  3) Postop care -pain well controlled -encourage ambulation  Contraception: salpingectomy Feeding: bottle  Dispo: Continue postpartum care as outlined above.  Will plan for discharge home tomorrow   LOS: 1 day   Brittany Pasqua, DO Faculty Attending, Center for [facility] 04/08/2024, 10:24 AM

## 2024-04-08 NOTE — Anesthesia Postprocedure Evaluation (Signed)
 Anesthesia Post Note  Patient: Brittany [name]  Procedure(s) Performed: LIGATION, FALLOPIAN TUBE, POSTPARTUM (Bilateral: Abdomen)     Patient location during evaluation: PACU Anesthesia Type: Epidural Level of consciousness: awake and alert Pain management: pain level controlled Vital Signs Assessment: post-procedure vital signs reviewed and stable Respiratory status: spontaneous breathing, respiratory function stable and nonlabored ventilation Cardiovascular status: blood pressure returned to baseline Postop Assessment: epidural receding and no apparent nausea or vomiting Anesthetic complications: no  No notable events documented.  Last Vitals:  Vitals:   04/08/24 0748 04/08/24 0754  BP:  137/84  Pulse:  88  Resp: 17 18  Temp:    SpO2:  95%                    Juventino Oppenheim

## 2024-04-08 NOTE — Anesthesia Postprocedure Evaluation (Signed)
 Anesthesia Post Note  Patient: Brittany [name]  Procedure(s) Performed: AN AD HOC LABOR EPIDURAL     Patient location during evaluation: PACU Anesthesia Type: Epidural Level of consciousness: awake and alert Pain management: pain level controlled Vital Signs Assessment: post-procedure vital signs reviewed and stable Respiratory status: spontaneous breathing, respiratory function stable and nonlabored ventilation Cardiovascular status: blood pressure returned to baseline Postop Assessment: epidural receding and no apparent nausea or vomiting Anesthetic complications: no   No notable events documented.  Last Vitals:  Vitals:   04/08/24 0748 04/08/24 0754  BP:  137/84  Pulse:  88  Resp: 17 18  Temp:    SpO2:  95%               Juventino Oppenheim

## 2024-04-09 ENCOUNTER — Other Ambulatory Visit (HOSPITAL_COMMUNITY): Payer: Self-pay

## 2024-04-09 LAB — SURGICAL PATHOLOGY

## 2024-04-09 MED ORDER — POTASSIUM CHLORIDE CRYS ER 20 MEQ PO TBCR
20.0000 meq | EXTENDED_RELEASE_TABLET | Freq: Every day | ORAL | 0 refills | Status: DC
  Filled 2024-04-09: qty 5, 5d supply, fill #0

## 2024-04-09 MED ORDER — FUROSEMIDE 20 MG PO TABS
20.0000 mg | ORAL_TABLET | Freq: Every day | ORAL | 0 refills | Status: DC
  Filled 2024-04-09: qty 5, 5d supply, fill #0

## 2024-04-09 MED ORDER — OXYCODONE HCL 5 MG PO TABS
5.0000 mg | ORAL_TABLET | ORAL | 0 refills | Status: DC | PRN
  Filled 2024-04-09: qty 30, 5d supply, fill #0

## 2024-04-09 MED ORDER — IBUPROFEN 600 MG PO TABS
600.0000 mg | ORAL_TABLET | Freq: Four times a day (QID) | ORAL | 0 refills | Status: AC
  Filled 2024-04-09: qty 30, 8d supply, fill #0

## 2024-04-09 MED ORDER — NIFEDIPINE ER 30 MG PO TB24
30.0000 mg | ORAL_TABLET | Freq: Every day | ORAL | 0 refills | Status: DC
  Filled 2024-04-09: qty 30, 30d supply, fill #0

## 2024-04-09 MED ORDER — POTASSIUM CHLORIDE CRYS ER 20 MEQ PO TBCR: 20.0000 meq | EXTENDED_RELEASE_TABLET | Freq: Once | ORAL | Status: DC

## 2024-04-09 MED ORDER — CHEWING GUM (ORBIT) SUGAR FREE
1.0000 | CHEWING_GUM | Freq: Three times a day (TID) | ORAL | Status: DC
  Filled 2024-04-09: qty 1

## 2024-04-09 MED ORDER — SIMETHICONE 80 MG PO CHEW
80.0000 mg | CHEWABLE_TABLET | ORAL | 0 refills | Status: DC | PRN
  Filled 2024-04-09: qty 30, 10d supply, fill #0

## 2024-04-09 NOTE — Plan of Care (Signed)
  Problem: Education: Goal: Knowledge of disease or condition will improve Outcome: Progressing Goal: Knowledge of the prescribed therapeutic regimen will improve Outcome: Progressing   Problem: Fluid Volume: Goal: Peripheral tissue perfusion will improve Outcome: Progressing   Problem: Clinical Measurements: Goal: Complications related to disease process, condition or treatment will be avoided or minimized Outcome: Progressing   Problem: Education: Goal: Knowledge of General Education information will improve Description: Including pain rating scale, medication(s)/side effects and non-pharmacologic comfort measures Outcome: Progressing   Problem: Health Behavior/Discharge Planning: Goal: Ability to manage health-related needs will improve Outcome: Progressing

## 2024-04-10 ENCOUNTER — Other Ambulatory Visit

## 2024-04-12 ENCOUNTER — Encounter (HOSPITAL_COMMUNITY): Payer: Self-pay

## 2024-04-12 DIAGNOSIS — K859 Acute pancreatitis without necrosis or infection, unspecified: Secondary | ICD-10-CM | POA: Diagnosis not present

## 2024-04-12 DIAGNOSIS — N854 Malposition of uterus: Secondary | ICD-10-CM | POA: Diagnosis not present

## 2024-04-12 DIAGNOSIS — Z79624 Long term (current) use of inhibitors of nucleotide synthesis: Secondary | ICD-10-CM | POA: Diagnosis not present

## 2024-04-12 DIAGNOSIS — G053 Encephalitis and encephalomyelitis in diseases classified elsewhere: Secondary | ICD-10-CM | POA: Diagnosis not present

## 2024-04-12 DIAGNOSIS — O8620 Urinary tract infection following delivery, unspecified: Secondary | ICD-10-CM | POA: Diagnosis not present

## 2024-04-12 DIAGNOSIS — E041 Nontoxic single thyroid nodule: Secondary | ICD-10-CM | POA: Diagnosis not present

## 2024-04-12 DIAGNOSIS — R109 Unspecified abdominal pain: Secondary | ICD-10-CM | POA: Diagnosis not present

## 2024-04-12 DIAGNOSIS — O165 Unspecified maternal hypertension, complicating the puerperium: Secondary | ICD-10-CM | POA: Diagnosis not present

## 2024-04-12 DIAGNOSIS — D259 Leiomyoma of uterus, unspecified: Secondary | ICD-10-CM | POA: Diagnosis not present

## 2024-04-12 DIAGNOSIS — Z79899 Other long term (current) drug therapy: Secondary | ICD-10-CM | POA: Diagnosis not present

## 2024-04-12 DIAGNOSIS — R9389 Abnormal findings on diagnostic imaging of other specified body structures: Secondary | ICD-10-CM | POA: Diagnosis not present

## 2024-04-12 DIAGNOSIS — R7989 Other specified abnormal findings of blood chemistry: Secondary | ICD-10-CM | POA: Diagnosis not present

## 2024-04-12 DIAGNOSIS — N390 Urinary tract infection, site not specified: Secondary | ICD-10-CM | POA: Diagnosis not present

## 2024-04-12 DIAGNOSIS — B964 Proteus (mirabilis) (morganii) as the cause of diseases classified elsewhere: Secondary | ICD-10-CM | POA: Diagnosis not present

## 2024-04-12 DIAGNOSIS — Z7952 Long term (current) use of systemic steroids: Secondary | ICD-10-CM | POA: Diagnosis not present

## 2024-04-12 DIAGNOSIS — I6522 Occlusion and stenosis of left carotid artery: Secondary | ICD-10-CM | POA: Diagnosis not present

## 2024-04-12 DIAGNOSIS — E871 Hypo-osmolality and hyponatremia: Secondary | ICD-10-CM | POA: Diagnosis not present

## 2024-04-12 DIAGNOSIS — D251 Intramural leiomyoma of uterus: Secondary | ICD-10-CM | POA: Diagnosis not present

## 2024-04-12 DIAGNOSIS — Z7982 Long term (current) use of aspirin: Secondary | ICD-10-CM | POA: Diagnosis not present

## 2024-04-12 DIAGNOSIS — M329 Systemic lupus erythematosus, unspecified: Secondary | ICD-10-CM | POA: Diagnosis not present

## 2024-04-12 DIAGNOSIS — D6862 Lupus anticoagulant syndrome: Secondary | ICD-10-CM | POA: Diagnosis not present

## 2024-04-12 DIAGNOSIS — R4182 Altered mental status, unspecified: Secondary | ICD-10-CM | POA: Diagnosis not present

## 2024-04-12 DIAGNOSIS — O9903 Anemia complicating the puerperium: Secondary | ICD-10-CM | POA: Diagnosis not present

## 2024-04-12 DIAGNOSIS — R519 Headache, unspecified: Secondary | ICD-10-CM | POA: Diagnosis not present

## 2024-04-12 DIAGNOSIS — O9913 Other diseases of the blood and blood-forming organs and certain disorders involving the immune mechanism complicating the puerperium: Secondary | ICD-10-CM | POA: Diagnosis not present

## 2024-04-12 DIAGNOSIS — O99893 Other specified diseases and conditions complicating puerperium: Secondary | ICD-10-CM | POA: Diagnosis not present

## 2024-04-12 DIAGNOSIS — M3219 Other organ or system involvement in systemic lupus erythematosus: Secondary | ICD-10-CM | POA: Diagnosis not present

## 2024-04-12 DIAGNOSIS — D869 Sarcoidosis, unspecified: Secondary | ICD-10-CM | POA: Diagnosis not present

## 2024-04-12 DIAGNOSIS — O9963 Diseases of the digestive system complicating the puerperium: Secondary | ICD-10-CM | POA: Diagnosis not present

## 2024-04-12 DIAGNOSIS — N3001 Acute cystitis with hematuria: Secondary | ICD-10-CM | POA: Diagnosis not present

## 2024-04-12 DIAGNOSIS — O99285 Endocrine, nutritional and metabolic diseases complicating the puerperium: Secondary | ICD-10-CM | POA: Diagnosis not present

## 2024-04-13 DIAGNOSIS — R319 Hematuria, unspecified: Secondary | ICD-10-CM | POA: Diagnosis not present

## 2024-04-13 DIAGNOSIS — R4182 Altered mental status, unspecified: Secondary | ICD-10-CM | POA: Diagnosis not present

## 2024-04-13 DIAGNOSIS — K851 Biliary acute pancreatitis without necrosis or infection: Secondary | ICD-10-CM | POA: Diagnosis not present

## 2024-04-13 DIAGNOSIS — M3219 Other organ or system involvement in systemic lupus erythematosus: Secondary | ICD-10-CM | POA: Diagnosis not present

## 2024-04-13 DIAGNOSIS — E871 Hypo-osmolality and hyponatremia: Secondary | ICD-10-CM | POA: Diagnosis not present

## 2024-04-13 DIAGNOSIS — D649 Anemia, unspecified: Secondary | ICD-10-CM | POA: Diagnosis not present

## 2024-04-13 DIAGNOSIS — I158 Other secondary hypertension: Secondary | ICD-10-CM | POA: Diagnosis not present

## 2024-04-13 DIAGNOSIS — R809 Proteinuria, unspecified: Secondary | ICD-10-CM | POA: Diagnosis not present

## 2024-04-13 DIAGNOSIS — G053 Encephalitis and encephalomyelitis in diseases classified elsewhere: Secondary | ICD-10-CM | POA: Diagnosis not present

## 2024-04-13 DIAGNOSIS — R410 Disorientation, unspecified: Secondary | ICD-10-CM | POA: Diagnosis not present

## 2024-04-13 DIAGNOSIS — N390 Urinary tract infection, site not specified: Secondary | ICD-10-CM | POA: Diagnosis not present

## 2024-04-14 ENCOUNTER — Encounter: Admitting: Obstetrics and Gynecology

## 2024-04-14 ENCOUNTER — Ambulatory Visit

## 2024-04-14 DIAGNOSIS — M3219 Other organ or system involvement in systemic lupus erythematosus: Secondary | ICD-10-CM | POA: Diagnosis not present

## 2024-04-14 DIAGNOSIS — G9341 Metabolic encephalopathy: Secondary | ICD-10-CM | POA: Diagnosis not present

## 2024-04-14 DIAGNOSIS — E871 Hypo-osmolality and hyponatremia: Secondary | ICD-10-CM | POA: Diagnosis not present

## 2024-04-14 DIAGNOSIS — M3214 Glomerular disease in systemic lupus erythematosus: Secondary | ICD-10-CM | POA: Diagnosis not present

## 2024-04-14 DIAGNOSIS — K5900 Constipation, unspecified: Secondary | ICD-10-CM | POA: Diagnosis not present

## 2024-04-14 DIAGNOSIS — Z634 Disappearance and death of family member: Secondary | ICD-10-CM | POA: Diagnosis not present

## 2024-04-14 DIAGNOSIS — O1413 Severe pre-eclampsia, third trimester: Secondary | ICD-10-CM | POA: Diagnosis not present

## 2024-04-14 DIAGNOSIS — Z3A35 35 weeks gestation of pregnancy: Secondary | ICD-10-CM | POA: Diagnosis not present

## 2024-04-14 DIAGNOSIS — O99113 Other diseases of the blood and blood-forming organs and certain disorders involving the immune mechanism complicating pregnancy, third trimester: Secondary | ICD-10-CM | POA: Diagnosis not present

## 2024-04-14 DIAGNOSIS — O99283 Endocrine, nutritional and metabolic diseases complicating pregnancy, third trimester: Secondary | ICD-10-CM | POA: Diagnosis not present

## 2024-04-14 DIAGNOSIS — G9340 Encephalopathy, unspecified: Secondary | ICD-10-CM | POA: Diagnosis not present

## 2024-04-14 DIAGNOSIS — D638 Anemia in other chronic diseases classified elsewhere: Secondary | ICD-10-CM | POA: Diagnosis not present

## 2024-04-14 DIAGNOSIS — I6782 Cerebral ischemia: Secondary | ICD-10-CM | POA: Diagnosis not present

## 2024-04-14 DIAGNOSIS — N3001 Acute cystitis with hematuria: Secondary | ICD-10-CM | POA: Diagnosis not present

## 2024-04-14 DIAGNOSIS — Z72820 Sleep deprivation: Secondary | ICD-10-CM | POA: Diagnosis not present

## 2024-04-14 DIAGNOSIS — G053 Encephalitis and encephalomyelitis in diseases classified elsewhere: Secondary | ICD-10-CM | POA: Diagnosis not present

## 2024-04-14 DIAGNOSIS — E872 Acidosis, unspecified: Secondary | ICD-10-CM | POA: Diagnosis not present

## 2024-04-14 DIAGNOSIS — N3000 Acute cystitis without hematuria: Secondary | ICD-10-CM | POA: Diagnosis not present

## 2024-04-14 DIAGNOSIS — R4182 Altered mental status, unspecified: Secondary | ICD-10-CM | POA: Diagnosis not present

## 2024-04-14 DIAGNOSIS — D508 Other iron deficiency anemias: Secondary | ICD-10-CM | POA: Diagnosis not present

## 2024-04-14 DIAGNOSIS — M064 Inflammatory polyarthropathy: Secondary | ICD-10-CM | POA: Diagnosis not present

## 2024-04-14 DIAGNOSIS — K859 Acute pancreatitis without necrosis or infection, unspecified: Secondary | ICD-10-CM | POA: Diagnosis not present

## 2024-04-14 DIAGNOSIS — O9089 Other complications of the puerperium, not elsewhere classified: Secondary | ICD-10-CM | POA: Diagnosis not present

## 2024-04-14 DIAGNOSIS — Z59819 Housing instability, housed unspecified: Secondary | ICD-10-CM | POA: Diagnosis not present

## 2024-04-14 DIAGNOSIS — R809 Proteinuria, unspecified: Secondary | ICD-10-CM | POA: Diagnosis not present

## 2024-04-14 DIAGNOSIS — O8620 Urinary tract infection following delivery, unspecified: Secondary | ICD-10-CM | POA: Diagnosis not present

## 2024-04-14 DIAGNOSIS — R9089 Other abnormal findings on diagnostic imaging of central nervous system: Secondary | ICD-10-CM | POA: Diagnosis not present

## 2024-04-14 DIAGNOSIS — O2343 Unspecified infection of urinary tract in pregnancy, third trimester: Secondary | ICD-10-CM | POA: Diagnosis not present

## 2024-04-14 DIAGNOSIS — O9081 Anemia of the puerperium: Secondary | ICD-10-CM | POA: Diagnosis not present

## 2024-04-14 DIAGNOSIS — M5432 Sciatica, left side: Secondary | ICD-10-CM | POA: Diagnosis not present

## 2024-04-14 DIAGNOSIS — E162 Hypoglycemia, unspecified: Secondary | ICD-10-CM | POA: Diagnosis not present

## 2024-04-14 DIAGNOSIS — D509 Iron deficiency anemia, unspecified: Secondary | ICD-10-CM | POA: Diagnosis not present

## 2024-04-14 DIAGNOSIS — E8729 Other acidosis: Secondary | ICD-10-CM | POA: Diagnosis not present

## 2024-04-15 DIAGNOSIS — R809 Proteinuria, unspecified: Secondary | ICD-10-CM | POA: Diagnosis not present

## 2024-04-15 DIAGNOSIS — F4489 Other dissociative and conversion disorders: Secondary | ICD-10-CM | POA: Diagnosis not present

## 2024-04-15 DIAGNOSIS — O99891 Other specified diseases and conditions complicating pregnancy: Secondary | ICD-10-CM | POA: Diagnosis not present

## 2024-04-15 DIAGNOSIS — R410 Disorientation, unspecified: Secondary | ICD-10-CM | POA: Diagnosis not present

## 2024-04-15 DIAGNOSIS — M329 Systemic lupus erythematosus, unspecified: Secondary | ICD-10-CM | POA: Diagnosis not present

## 2024-04-15 DIAGNOSIS — R4182 Altered mental status, unspecified: Secondary | ICD-10-CM | POA: Diagnosis not present

## 2024-04-16 ENCOUNTER — Inpatient Hospital Stay (HOSPITAL_COMMUNITY)

## 2024-04-16 ENCOUNTER — Inpatient Hospital Stay (HOSPITAL_COMMUNITY): Admission: RE | Admit: 2024-04-16 | Source: Home / Self Care | Admitting: Family Medicine

## 2024-04-16 DIAGNOSIS — M329 Systemic lupus erythematosus, unspecified: Secondary | ICD-10-CM | POA: Diagnosis not present

## 2024-04-16 DIAGNOSIS — R410 Disorientation, unspecified: Secondary | ICD-10-CM | POA: Diagnosis not present

## 2024-04-17 DIAGNOSIS — F4489 Other dissociative and conversion disorders: Secondary | ICD-10-CM | POA: Diagnosis not present

## 2024-04-17 DIAGNOSIS — R519 Headache, unspecified: Secondary | ICD-10-CM | POA: Diagnosis not present

## 2024-04-17 DIAGNOSIS — R809 Proteinuria, unspecified: Secondary | ICD-10-CM | POA: Diagnosis not present

## 2024-04-17 DIAGNOSIS — M329 Systemic lupus erythematosus, unspecified: Secondary | ICD-10-CM | POA: Diagnosis not present

## 2024-04-18 DIAGNOSIS — M329 Systemic lupus erythematosus, unspecified: Secondary | ICD-10-CM | POA: Diagnosis not present

## 2024-04-18 DIAGNOSIS — R410 Disorientation, unspecified: Secondary | ICD-10-CM | POA: Diagnosis not present

## 2024-04-21 ENCOUNTER — Encounter: Admitting: Obstetrics & Gynecology

## 2024-04-22 DIAGNOSIS — Z79899 Other long term (current) drug therapy: Secondary | ICD-10-CM | POA: Diagnosis not present

## 2024-04-22 DIAGNOSIS — M329 Systemic lupus erythematosus, unspecified: Secondary | ICD-10-CM | POA: Diagnosis not present

## 2024-04-23 ENCOUNTER — Telehealth (HOSPITAL_COMMUNITY): Payer: Self-pay | Admitting: *Deleted

## 2024-04-23 NOTE — Telephone Encounter (Signed)
 04/23/2024  Name: Brittany [name] MRN: [id number] DOB: May 01, 1981  Reason for Call:  Transition of Care Hospital Discharge Call  Contact Status: Patient Contact Status: Complete  Language assistant needed: Interpreter Mode: Interpreter Not Needed        Follow-Up Questions: Do You Have Any Concerns About Your Health As You Heal From Delivery?: No Do You Have Any Concerns About Your Infants Health?: No  Edinburgh Postnatal Depression Scale:  In the Past 7 Days: I have been able to laugh and see the funny side of things.: As much as I always could I have looked forward with enjoyment to things.: Rather less than I used to I have blamed myself unnecessarily when things went wrong.: No, never I have been anxious or worried for no good reason.: Yes, sometimes I have felt scared or panicky for no good reason.: No, not at all Things have been getting on top of me.: No, most of the time I have coped quite well I have been so unhappy that I have had difficulty sleeping.: Not at all I have felt sad or miserable.: Not very often I have been so unhappy that I have been crying.: No, never The thought of harming myself has occurred to me.: Never Edinburgh Postnatal Depression Scale Total: 5  PHQ2-9 Depression Scale:     Discharge Follow-up: Edinburgh score requires follow up?: No Patient was advised of the following resources:: Support Group, Breastfeeding Support Group  Post-discharge interventions: Reviewed Newborn Safe Sleep Practices  Pearlie Bougie, RN 04/23/2024 16:15

## 2024-04-29 ENCOUNTER — Encounter: Admitting: Obstetrics and Gynecology

## 2024-05-02 DIAGNOSIS — E871 Hypo-osmolality and hyponatremia: Secondary | ICD-10-CM | POA: Diagnosis not present

## 2024-05-02 DIAGNOSIS — Z92241 Personal history of systemic steroid therapy: Secondary | ICD-10-CM | POA: Diagnosis not present

## 2024-05-02 DIAGNOSIS — E042 Nontoxic multinodular goiter: Secondary | ICD-10-CM | POA: Diagnosis not present

## 2024-05-05 DIAGNOSIS — L930 Discoid lupus erythematosus: Secondary | ICD-10-CM | POA: Diagnosis not present

## 2024-05-07 ENCOUNTER — Ambulatory Visit
Admission: RE | Admit: 2024-05-07 | Discharge: 2024-05-07 | Disposition: A | Discharge status: Home / Self Care | Source: Ambulatory Visit | Attending: Family Medicine | Admitting: Family Medicine

## 2024-05-07 DIAGNOSIS — Z1231 Encounter for screening mammogram for malignant neoplasm of breast: Secondary | ICD-10-CM

## 2024-05-08 ENCOUNTER — Ambulatory Visit: Admitting: Family Medicine

## 2024-05-08 DIAGNOSIS — D6862 Lupus anticoagulant syndrome: Secondary | ICD-10-CM | POA: Diagnosis not present

## 2024-05-08 NOTE — Progress Notes (Signed)
 Post Partum Visit Note  Brittany [name] is a 43 y.o. [id number] female who presents for a postpartum visit. She is 4 weeks postpartum following a normal spontaneous vaginal delivery.  I have fully reviewed the prenatal and intrapartum course. The delivery was at 35 gestational weeks.  Anesthesia: epidural. Postpartum course has been complicated by readmission for Lupus flare. She has had her medications changed by rheumatology. Baby is doing well. Baby is feeding by bottle - Similac Neosure. Bleeding no bleeding. Bowel function is normal. Bladder function is normal. Patient is not sexually active. Contraception method is tubal ligation. Postpartum depression screening: positive.   The pregnancy intention screening data noted above was reviewed. Potential methods of contraception were discussed. The patient elected to proceed with No data recorded.   Edinburgh Postnatal Depression Scale - 05/08/24 1126       Edinburgh Postnatal Depression Scale:  In the Past 7 Days   I have been able to laugh and see the funny side of things. 1    I have looked forward with enjoyment to things. 1    I have blamed myself unnecessarily when things went wrong. 2    I have been anxious or worried for no good reason. 2    I have felt scared or panicky for no good reason. 2    Things have been getting on top of me. 2    I have been so unhappy that I have had difficulty sleeping. 0    I have felt sad or miserable. 0    I have been so unhappy that I have been crying. 0    The thought of harming myself has occurred to me. 0    Edinburgh Postnatal Depression Scale Total 10             Health Maintenance Due  Topic Date Due   COVID-19 Vaccine (1) Never done    The following portions of the patient's history were reviewed and updated as appropriate: allergies, current medications, past family history, past medical history, past social history, past surgical history, and problem list.  Review of  Systems Pertinent items are noted in HPI.  Objective:  BP 131/88 (BP Location: Left Arm, Patient Position: Sitting, Cuff Size: Large)   Pulse (!) 102   Ht 5\' 8"  (1.727 m)   Wt 191 lb (86.6 kg)   LMP  (LMP Unknown)   Breastfeeding No   BMI 29.04 kg/m    General:  alert, cooperative, and no distress   Breasts:  not indicated  Lungs: clear to auscultation bilaterally  Heart:  regular rate and rhythm, S1, S2 normal, no murmur, click, rub or gallop  Abdomen: soft, non-tender; bowel sounds normal; no masses,  no organomegaly   Wound well approximated incision  GU exam:  not indicated       Assessment:   1. Postpartum care and examination (Primary) BP improved  2. Lupus anticoagulant complicating pregnancy, antepartum (HCC) On Azathioprine, plaquinel. Titrating down her prednisone . Rash, hair, scalp improving   Plan:   Essential components of care per ACOG recommendations:  1.  Mood and well being: Patient with negative depression screening today. Reviewed local resources for support.  - Patient tobacco use? No.   - hx of drug use? No.    2. Infant care and feeding:  -Patient currently breastmilk feeding? No.  -Social determinants of health (SDOH) reviewed in EPIC. No concerns  3. Sexuality, contraception and birth spacing - Patient does not want  a pregnancy in the next year.   - Reviewed reproductive life planning. Reviewed contraceptive methods based on pt preferences and effectiveness.  Patient desired Female Sterilization today.   - Discussed birth spacing of 18 months  4. Sleep and fatigue -Encouraged family/partner/community support of 4 hrs of uninterrupted sleep to help with mood and fatigue  5. Physical Recovery  - Discussed patients delivery and complications. She describes her labor as good. - Patient had a Vaginal, no problems at delivery.  Perineal healing reviewed. Patient expressed understanding - Patient has urinary incontinence? No. - Patient is safe to  resume physical and sexual activity  6.  Health Maintenance - HM due items addressed Yes - Last pap smear  Diagnosis  Date Value Ref Range Status  11/15/2023   Final   - Negative for intraepithelial lesion or malignancy (NILM)   Pap smear not done at today's visit. - needs yearly PAP -Breast Cancer screening indicated? No.   7. Chronic Disease/Pregnancy Condition follow up: lupus  - PCP follow up  Rayann Jolley J Harlen Danford, DO Center for [facility], [facility]

## 2024-05-13 ENCOUNTER — Ambulatory Visit: Payer: Self-pay | Admitting: Family Medicine

## 2024-05-15 DIAGNOSIS — Z79899 Other long term (current) drug therapy: Secondary | ICD-10-CM | POA: Diagnosis not present

## 2024-05-15 DIAGNOSIS — M329 Systemic lupus erythematosus, unspecified: Secondary | ICD-10-CM | POA: Diagnosis not present

## 2024-05-15 DIAGNOSIS — E042 Nontoxic multinodular goiter: Secondary | ICD-10-CM | POA: Diagnosis not present

## 2024-05-28 ENCOUNTER — Ambulatory Visit: Admitting: Family Medicine

## 2024-06-17 DIAGNOSIS — Z79899 Other long term (current) drug therapy: Secondary | ICD-10-CM | POA: Diagnosis not present

## 2024-06-17 DIAGNOSIS — M329 Systemic lupus erythematosus, unspecified: Secondary | ICD-10-CM | POA: Diagnosis not present

## 2024-06-20 DIAGNOSIS — M329 Systemic lupus erythematosus, unspecified: Secondary | ICD-10-CM | POA: Diagnosis not present

## 2024-06-20 DIAGNOSIS — Z79899 Other long term (current) drug therapy: Secondary | ICD-10-CM | POA: Diagnosis not present

## 2024-07-17 DIAGNOSIS — Z79899 Other long term (current) drug therapy: Secondary | ICD-10-CM | POA: Diagnosis not present

## 2024-07-17 DIAGNOSIS — M329 Systemic lupus erythematosus, unspecified: Secondary | ICD-10-CM | POA: Diagnosis not present

## 2024-07-28 ENCOUNTER — Telehealth: Payer: Self-pay | Admitting: *Deleted

## 2024-07-28 DIAGNOSIS — M3219 Other organ or system involvement in systemic lupus erythematosus: Secondary | ICD-10-CM

## 2024-07-28 NOTE — Progress Notes (Signed)
 Complex Care Management Note  Care Guide Note 07/28/2024 Name: MERON BOCCHINO MRN: [id number] DOB: 1981/06/03  Kittie A Murph-Brownlee is a 43 y.o. year old female who sees Patient, No Pcp Per for primary care. I reached out to Anatalia A Murph-Brownlee by phone today to offer complex care management services.  Ms. Morency was given information about Complex Care Management services today including:   The Complex Care Management services include support from the care team which includes your Nurse Care Manager, Clinical Social Worker, or Pharmacist.  The Complex Care Management team is here to help remove barriers to the health concerns and goals most important to you. Complex Care Management services are voluntary, and the patient may decline or stop services at any time by request to their care team member.   Complex Care Management Consent Status: Patient did not agree to participate in complex care management services at this time.  Follow up plan: None   Encounter Outcome:  Patient Refused  Patient primary care provider is Dr. Barbra she will update insurance company.  Harlene Satterfield  [facility] Guide  Direct Dial: [phone]  Fax [phone]

## 2024-08-22 DIAGNOSIS — Z79899 Other long term (current) drug therapy: Secondary | ICD-10-CM | POA: Diagnosis not present

## 2024-08-22 DIAGNOSIS — M329 Systemic lupus erythematosus, unspecified: Secondary | ICD-10-CM | POA: Diagnosis not present

## 2024-09-03 DIAGNOSIS — R809 Proteinuria, unspecified: Secondary | ICD-10-CM | POA: Diagnosis not present

## 2024-09-03 DIAGNOSIS — M329 Systemic lupus erythematosus, unspecified: Secondary | ICD-10-CM | POA: Diagnosis not present

## 2024-09-16 DIAGNOSIS — M329 Systemic lupus erythematosus, unspecified: Secondary | ICD-10-CM | POA: Diagnosis not present

## 2024-09-22 DIAGNOSIS — E538 Deficiency of other specified B group vitamins: Secondary | ICD-10-CM | POA: Diagnosis not present

## 2024-09-22 DIAGNOSIS — D649 Anemia, unspecified: Secondary | ICD-10-CM | POA: Diagnosis not present

## 2024-09-22 DIAGNOSIS — D709 Neutropenia, unspecified: Secondary | ICD-10-CM | POA: Diagnosis not present

## 2024-09-25 DIAGNOSIS — M329 Systemic lupus erythematosus, unspecified: Secondary | ICD-10-CM | POA: Diagnosis not present

## 2024-09-25 DIAGNOSIS — Z79899 Other long term (current) drug therapy: Secondary | ICD-10-CM | POA: Diagnosis not present

## 2024-10-10 DIAGNOSIS — L930 Discoid lupus erythematosus: Secondary | ICD-10-CM | POA: Diagnosis not present

## 2024-10-17 ENCOUNTER — Ambulatory Visit

## 2024-10-17 VITALS — BP 116/76 | HR 79 | Ht 67.0 in | Wt 206.0 lb

## 2024-10-17 DIAGNOSIS — R630 Anorexia: Secondary | ICD-10-CM | POA: Diagnosis not present

## 2024-10-17 DIAGNOSIS — N912 Amenorrhea, unspecified: Secondary | ICD-10-CM

## 2024-10-17 DIAGNOSIS — R1010 Upper abdominal pain, unspecified: Secondary | ICD-10-CM

## 2024-10-17 DIAGNOSIS — R110 Nausea: Secondary | ICD-10-CM | POA: Diagnosis not present

## 2024-10-17 NOTE — Progress Notes (Signed)
 GYNECOLOGY PROBLEM OFFICE VISIT NOTE  History:  Brittany [name] is a 43 y.o. [id number] here today for abdominal pain.  She states the pain is not going away.  She also reports decreased appetite and nausea.   She states the abdominal pain started a few weeks ago.  She clarifies that it has been present since (vaginal) delivery May 2025. She describes the pain as pressure and some hardness when touching the area.  She states the pain is constant, but has varying degrees of intensity.  She states the pain has no relieving or worsening factors. She rates it a 5/10, but feels it is somewhat manageable. Her concern is that the pain is still present and increased recently.   She expresses concern with this pain being r/t having an hernia. However, she has no history of hernia.   She states she noted change in appetite around the time the pain intensified. She states onset of nausea has been present for same duration.    She denies any abnormal vaginal discharge, bleeding, pelvic pain or other concerns.   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   Anemia    Anemia, unspecified 02/04/2018   Lupus    Sarcoidosis    Seasonal and perennial allergic rhinitis 02/28/2018   Urticaria     Past Surgical History:  Procedure Laterality Date   BIOPSY THYROID     BREAST BIOPSY Right 10/09/2022   MM RT BREAST BX W LOC DEV 1ST LESION IMAGE BX SPEC STEREO GUIDE 10/09/2022 [facility]   BREAST BIOPSY Right 10/25/2022   MM RT BREAST BX W LOC DEV 1ST LESION IMAGE BX SPEC STEREO GUIDE 10/25/2022 [facility]   DILATION AND CURETTAGE OF UTERUS     DILATION AND CURETTAGE, DIAGNOSTIC / THERAPEUTIC  01/04/2018   TUBAL LIGATION Bilateral  04/07/2024   Procedure: LIGATION, FALLOPIAN TUBE, POSTPARTUM;  Surgeon: Barbra Lang PARAS, DO;  Location: [facility] LD ORS;  Service: Gynecology;  Laterality: Bilateral;    The following portions of the patient's history were reviewed and updated as appropriate: allergies, current medications, past family history, past medical history, past social history, past surgical history and problem list.   Health Maintenance:  No LMP recorded. (Menstrual status: Bilateral Tubal Ligation). Normal pap Dec 2024.  Normal mammogram on June 2025.   Review of Systems:  Genito-Urinary ROS: positive for - amenorrhea negative for - change in menstrual cycle, dyspareunia, dysuria, genital discharge, urinary frequency/urgency, or vulvar/vaginal symptoms Gastrointestinal ROS: positive for - abdominal pain, appetite loss, gas/bloating, and nausea negative for - change in bowel habits, change in stools, constipation, or diarrhea Objective:  Vitals: BP 116/76 (BP Location: Left Arm, Patient Position: Sitting, Cuff Size: Large)   Pulse 79   Ht 5' 7 (1.702 m)   Wt 206 lb (93.4 kg)   Breastfeeding No   BMI 32.26 kg/m   Physical Exam: Physical Exam Constitutional:      Appearance: Normal appearance.  HENT:     Head: Normocephalic and atraumatic.  Eyes:     Conjunctiva/sclera: Conjunctivae normal.  Pulmonary:     Effort: Pulmonary effort is normal. No respiratory distress.  Abdominal:     General: Bowel sounds are  normal.     Palpations: Abdomen is soft. There is no mass.     Tenderness: There is abdominal tenderness in the periumbilical area. There is no guarding.     Hernia: No hernia is present.      Comments: Area of firmness. Mildly tender, but increased tender below area.   Neurological:     Mental Status: She is alert and oriented to person, place, and time.  Skin:    General: Skin is warm and dry.     Findings: Rash (Malar on Face) present.  Psychiatric:        Mood and Affect: Mood normal.         Behavior: Behavior normal.  Vitals reviewed.      Labs and Imaging: No results found.  Assessment & Plan:  43 year old Abdominal Pain   -Exam performed and findings reviewed. -Informed that based on complaints issues are likely gastrointestinal in nature. Reviewed potential causes including medications.  -Reviewed exam findings and how umbilical hernia not appreciated. -Discussed other potential causes for pain including adhesions (from BTL) or inflammation of organs from Lupus. -Recommend referral to GI and patient agreeable.  -UPT cancelled as patient reports UPT on 11/7 Negative. Review of chart confirms.  -Precautions reviewed.  -RTO prn or for yearly annual exam.    Total face-to-face time with patient: 15 minutes   Synthia Raisin, CNM 10/17/2024 10:17 AM

## 2024-10-21 DIAGNOSIS — M329 Systemic lupus erythematosus, unspecified: Secondary | ICD-10-CM | POA: Diagnosis not present

## 2024-10-23 DIAGNOSIS — D709 Neutropenia, unspecified: Secondary | ICD-10-CM | POA: Diagnosis not present

## 2024-10-24 ENCOUNTER — Encounter: Payer: Self-pay | Admitting: Gastroenterology

## 2024-10-27 DIAGNOSIS — Z5181 Encounter for therapeutic drug level monitoring: Secondary | ICD-10-CM | POA: Diagnosis not present

## 2024-11-04 DIAGNOSIS — Z5181 Encounter for therapeutic drug level monitoring: Secondary | ICD-10-CM | POA: Diagnosis not present

## 2024-11-07 ENCOUNTER — Other Ambulatory Visit (HOSPITAL_COMMUNITY): Payer: Self-pay

## 2024-11-11 DIAGNOSIS — Z5181 Encounter for therapeutic drug level monitoring: Secondary | ICD-10-CM | POA: Diagnosis not present

## 2024-11-13 DIAGNOSIS — Z92241 Personal history of systemic steroid therapy: Secondary | ICD-10-CM | POA: Diagnosis not present

## 2024-11-13 DIAGNOSIS — E871 Hypo-osmolality and hyponatremia: Secondary | ICD-10-CM | POA: Diagnosis not present

## 2024-11-13 DIAGNOSIS — E559 Vitamin D deficiency, unspecified: Secondary | ICD-10-CM | POA: Diagnosis not present

## 2024-11-13 DIAGNOSIS — E042 Nontoxic multinodular goiter: Secondary | ICD-10-CM | POA: Diagnosis not present

## 2024-12-01 ENCOUNTER — Encounter: Payer: Self-pay | Admitting: Family Medicine

## 2024-12-09 NOTE — Progress Notes (Signed)
 Subjective: RPV 44 y.o. female  Discoid lesions and SLE since 2000 (dx'd 2010) On HCQ 200 mg BID + pred 5 mg QD PRN per Rheum Started Thalidomide 50 mg daily in early Dec - ~4 weeks  Endorses mild dizziness and HA, tolerable with NSAIDS/Tylenol  PRN  Feels skin has started to improve Not quite as dark, decreased on lateral cheeks  Denies itch, burning, pain Not using topicals Going to esthetician for gentle facial treatments   Delivered daughter 04/07/24 Lupus flared during that period S/p tubes tied  Failed AZA (leukopenia), MMF (migraines) Nephro planning on RAAS blocker if BP robust enough  Objective:   Discoid lesions on scalp; plaque on left temporal scalp remains actively inflamed  Acute lupus rash on malar and seborrheic distribution    Lymphopenic, anemic Cr 0.5   Low C4   Elevated DsDNA   Assessment:  1. Discoid lupus erythematosus  triamcinolone  acetonide (KENALOG ) 0.1 % ointment    2. 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 Mild early improvement on Thalidomide, though needs at least 8 months of therapy for full results.     Plan Discussed dx, rx options Sunscreen Continue thalidomide 50 mg every day - sent  Monthly pregnancy test- neg today  TAC ointment daily to active scalp lesions  Cont other systemics per rheum / nephro, etc Side effects of meds and appropriate application reviewed RTC 42m from first dose of thalidomide (May) - sooner as needed.

## 2024-12-09 NOTE — Progress Notes (Signed)
I have performed a dermatologically relevant history and physical examination. I have planned and supervised proceduresand the evaluation and treatment plan and I agree with the above.

## 2024-12-10 ENCOUNTER — Encounter: Payer: Self-pay | Admitting: Gastroenterology

## 2024-12-10 ENCOUNTER — Ambulatory Visit: Admitting: Gastroenterology

## 2024-12-10 VITALS — BP 108/78 | HR 84 | Ht 67.0 in | Wt 204.0 lb

## 2024-12-10 DIAGNOSIS — R110 Nausea: Secondary | ICD-10-CM | POA: Insufficient documentation

## 2024-12-10 DIAGNOSIS — Z1211 Encounter for screening for malignant neoplasm of colon: Secondary | ICD-10-CM | POA: Insufficient documentation

## 2024-12-10 DIAGNOSIS — R1033 Periumbilical pain: Secondary | ICD-10-CM | POA: Diagnosis not present

## 2024-12-10 NOTE — Patient Instructions (Signed)
 You have been scheduled for a CT scan of the abdomen and pelvis at [facility], 1st floor Radiology. You are scheduled on Wednesday 12/17/24 at 10:30 am. You should arrive 15 minutes prior to your appointment time for registration.    Please follow the written instructions below on the day of your exam:   1) Do not eat anything after 8:30 am (4 hours prior to your test)    You may take any medications as prescribed with a small amount of water , if necessary. If you take any of the following medications: METFORMIN, GLUCOPHAGE, GLUCOVANCE, AVANDAMET, RIOMET, FORTAMET, ACTOPLUS MET, JANUMET, GLUMETZA or METAGLIP, you MAY be asked to HOLD this medication 48 hours AFTER the exam.   The purpose of you drinking the oral contrast is to aid in the visualization of your intestinal tract. The contrast solution may cause some diarrhea. Depending on your individual set of symptoms, you may also receive an intravenous injection of x-ray contrast/dye. Plan on being at [facility] for 45 minutes or longer, depending on the type of exam you are having performed.   If you have any questions regarding your exam or if you need to reschedule, you may call [facility] at [phone] between the hours of 8:00 am and 5:00 pm, Monday-Friday.   You have been scheduled for a colonoscopy. Please follow written instructions given to you at your visit today.   If you use inhalers (even only as needed), please bring them with you on the day of your procedure.  DO NOT TAKE 7 DAYS PRIOR TO TEST- Trulicity (dulaglutide) Ozempic, Wegovy (semaglutide) Mounjaro, Zepbound (tirzepatide) Bydureon Bcise (exanatide extended release)  DO NOT TAKE 1 DAY PRIOR TO YOUR TEST Rybelsus (semaglutide) Adlyxin (lixisenatide) Victoza (liraglutide) Byetta (exanatide) ___________________________________________________________________________

## 2024-12-10 NOTE — Progress Notes (Signed)
 "    12/10/2024 Brittany [name] [id number] 1981/11/24  Discussed the use of AI scribe software for clinical note transcription with the patient, who gave verbal consent to proceed.  History of Present Illness Brittany [name] is a 44 year old female with lupus who presents for evaluation of persistent abdominal pain.  Intermittent abdominal pain has been present since the birth of her daughter in May 2025. Pain is localized to the right of the umbilicus, varies in intensity, and is described as coming and going but remains in the same area. No pain was present during pregnancy; onset was postpartum. She initially attributed symptoms to irregular eating and disrupted sleep related to infant care. Weight has fluctuated and she has not resumed her prior exercise routine. She was told during a surgical consultation for liposuction that she might have a hernia in the area of pain, which led her to seek further evaluation.  Bowel movements are currently normal. Immediately postpartum, she experienced alternating constipation and diarrhea, which she attributes to hormonal changes. No blood in the stool is reported.  Lupus flared during pregnancy and postpartum, requiring two hospitalizations, including a one- to two-week stay after delivery.  Multiple organ systems were affected during this period.  She underwent tubal ligation after her most recent pregnancy and has not had any C-sections. She is not breastfeeding and uses formula for her infant. No personal or family history of colon cancer. Has never had a colonoscopy in the past.   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   Anemia    Anemia, unspecified 02/04/2018   Lupus    Sarcoidosis     Seasonal and perennial allergic rhinitis 02/28/2018   Urticaria    Past Surgical History:  Procedure Laterality Date   BIOPSY THYROID     BREAST BIOPSY Right 10/09/2022   MM RT BREAST BX W LOC DEV 1ST LESION IMAGE BX SPEC STEREO GUIDE 10/09/2022 [facility]   BREAST BIOPSY Right 10/25/2022   MM RT BREAST BX W LOC DEV 1ST LESION IMAGE BX SPEC STEREO GUIDE 10/25/2022 [facility]   DILATION AND CURETTAGE OF UTERUS     DILATION AND CURETTAGE, DIAGNOSTIC / THERAPEUTIC  01/04/2018   TUBAL LIGATION Bilateral 04/07/2024   Procedure: LIGATION, FALLOPIAN TUBE, POSTPARTUM;  Surgeon: Barbra Lang PARAS, DO;  Location: [facility];  Service: Gynecology;  Laterality: Bilateral;    reports that she has never smoked. She has never used smokeless tobacco. She reports that she does not currently use alcohol after a past usage of about 1.0 standard drink of alcohol per week. She reports that she does not use drugs. family history includes Allergic rhinitis in her brother and father; Allergy (severe) in her father; Anemia in her mother; Food Allergy in her mother; Heart disease in her father and paternal grandfather; Hypertension in her brother and mother; Prostate cancer in her father. Allergies[1]    Outpatient Encounter Medications as of 12/10/2024  Medication Sig   acetaminophen  (TYLENOL ) 325 MG tablet Take 2 tablets (650 mg total) by mouth every 4 (four) hours as needed (for pain scale < 4).   hydroxychloroquine  (PLAQUENIL ) 200 MG tablet Take by mouth.   ibuprofen  (ADVIL ) 600  MG tablet Take 1 tablet (600 mg total) by mouth every 6 (six) hours.   losartan (COZAAR) 25 MG tablet Take 25 mg by mouth daily.   predniSONE  (DELTASONE ) 20 MG tablet Take 20 mg by mouth daily with breakfast. (Patient taking differently: Take 20 mg by mouth as needed.)   thalidomide (THALOMID) 50 MG capsule Take 50 mg by mouth.   [DISCONTINUED] aspirin EC 81 MG tablet Take 81 mg by mouth daily. Swallow whole. (Patient not  taking: Reported on 10/17/2024)   [DISCONTINUED] azaTHIOprine (IMURAN) 50 MG tablet Take 50 mg by mouth daily. (Patient not taking: Reported on 10/17/2024)   [DISCONTINUED] hydrOXYzine  (ATARAX ) 25 MG tablet Take 1 tablet (25 mg total) by mouth 3 (three) times daily as needed for itching.   [DISCONTINUED] loratadine  (CLARITIN ) 10 MG tablet Take 1 tablet (10 mg total) by mouth daily.   [DISCONTINUED] losartan (COZAAR) 25 MG tablet Take 25 mg by mouth daily.   [DISCONTINUED] mycophenolate (CELLCEPT) 250 MG capsule Take 500 mg by mouth 2 (two) times daily. (Patient not taking: Reported on 10/17/2024)   [DISCONTINUED] ondansetron  (ZOFRAN -ODT) 4 MG disintegrating tablet Take 1 tablet (4 mg total) by mouth every 8 (eight) hours as needed for nausea or vomiting. (Patient not taking: Reported on 10/17/2024)   [DISCONTINUED] potassium chloride  SA (KLOR-CON  M) 20 MEQ tablet Take 1 tablet (20 mEq total) by mouth daily for 5 days. (Patient not taking: Reported on 10/17/2024)   [DISCONTINUED] Prenatal Vit-Fe Fumarate-FA (MULTIVITAMIN-PRENATAL) 27-0.8 MG TABS tablet Take 1 tablet by mouth daily at 12 noon. (Patient not taking: Reported on 10/17/2024)   No facility-administered encounter medications on file as of 12/10/2024.    REVIEW OF SYSTEMS  : All other systems reviewed and negative except where noted in the History of Present Illness.   PHYSICAL EXAM: BP 108/78   Pulse 84   Ht 5' 7 (1.702 m)   Wt 204 lb (92.5 kg)   SpO2 95%   Breastfeeding No   BMI 31.95 kg/m  General: Well developed female in no acute distress Head: Normocephalic and atraumatic Eyes:  Sclerae anicteric, conjunctiva pink. Ears: Normal auditory acuity Lungs: Clear throughout to auscultation; no W/R/R. Heart: Regular rate and rhythm; no M/R/G. Abdomen: Soft, non-distended.  BS present.  Mild TTP to the right and just above the umbilicus with somewhat of a bulging appearance, reducible. Rectal:  Will be done at the time of  colonoscopy. Musculoskeletal: Symmetrical with no gross deformities  Skin: No lesions on visible extremities Extremities: No edema  Neurological: Alert oriented x 4, grossly non-focal Psychological:  Alert and cooperative. Normal mood and affect  Assessment & Plan Abdominal pain with suspected abdominal hernia Intermittent right periumbilical pain of unclear etiology with possible small hernia on examination. Further evaluation is necessary to confirm hernia and exclude other intra-abdominal pathology. Surgical intervention is indicated only if symptoms are significant. - Ordered abdominal and pelvic CT scan to evaluate for hernia and other intra-abdominal pathology. - Discussed that if a symptomatic hernia is confirmed, referral to general surgery for further evaluation and possible repair if indicated.  Colorectal cancer screening She is due for initial screening colonoscopy per guidelines, with no personal or family history of colorectal cancer. Colonoscopy is recommended for detection and removal of polyps to prevent malignancy. The interval for repeat screening will depend on findings. - Scheduled screening colonoscopy after her 44th birthday next month.  Scheduled with Dr. Nandigam.  The risks, benefits, and alternatives to colonoscopy were discussed with the patient  and she consents to proceed.        CC:  Synthia Raisin, CNM        [1]  Allergies Allergen Reactions   Latex Hives   Strawberry (Diagnostic) Rash   "

## 2024-12-17 ENCOUNTER — Encounter (HOSPITAL_BASED_OUTPATIENT_CLINIC_OR_DEPARTMENT_OTHER): Payer: Self-pay

## 2024-12-17 ENCOUNTER — Ambulatory Visit (HOSPITAL_BASED_OUTPATIENT_CLINIC_OR_DEPARTMENT_OTHER)
Admission: RE | Admit: 2024-12-17 | Discharge: 2024-12-17 | Disposition: A | Discharge status: Home / Self Care | Source: Ambulatory Visit | Attending: Gastroenterology | Admitting: Gastroenterology

## 2024-12-17 DIAGNOSIS — R110 Nausea: Secondary | ICD-10-CM | POA: Diagnosis present

## 2024-12-17 DIAGNOSIS — R1033 Periumbilical pain: Secondary | ICD-10-CM | POA: Insufficient documentation

## 2024-12-17 MED ORDER — IOHEXOL 300 MG/ML  SOLN
100.0000 mL | Freq: Once | INTRAMUSCULAR | Status: AC | PRN
  Administered 2024-12-17: 100 mL via INTRAVENOUS

## 2024-12-19 ENCOUNTER — Ambulatory Visit: Payer: Self-pay | Admitting: Gastroenterology

## 2024-12-25 ENCOUNTER — Other Ambulatory Visit: Payer: Self-pay

## 2024-12-25 DIAGNOSIS — M6208 Separation of muscle (nontraumatic), other site: Secondary | ICD-10-CM

## 2025-01-09 ENCOUNTER — Other Ambulatory Visit: Payer: Self-pay | Admitting: *Deleted

## 2025-01-09 MED ORDER — NA SULFATE-K SULFATE-MG SULF 17.5-3.13-1.6 GM/177ML PO SOLN: 1.0000 | Freq: Once | ORAL | 0 refills | Status: AC

## 2025-01-12 ENCOUNTER — Encounter: Admitting: Gastroenterology
